# Patient Record
Sex: Female | Born: 1950 | Race: Black or African American | Hispanic: No | Marital: Married | Smoking: Never smoker
Health system: Southern US, Community
[De-identification: ages and names within clinical notes are randomized; demographics above are authoritative.]

## PROBLEM LIST (undated history)

## (undated) DIAGNOSIS — E119 Type 2 diabetes mellitus without complications: Secondary | ICD-10-CM

## (undated) DIAGNOSIS — I10 Essential (primary) hypertension: Secondary | ICD-10-CM

## (undated) DIAGNOSIS — R519 Headache, unspecified: Secondary | ICD-10-CM

## (undated) DIAGNOSIS — M1990 Unspecified osteoarthritis, unspecified site: Secondary | ICD-10-CM

## (undated) DIAGNOSIS — K219 Gastro-esophageal reflux disease without esophagitis: Secondary | ICD-10-CM

## (undated) HISTORY — DX: Essential (primary) hypertension: I10

## (undated) HISTORY — PX: KNEE SURGERY: SHX244

## (undated) HISTORY — DX: Type 2 diabetes mellitus without complications: E11.9

## (undated) HISTORY — PX: COLONOSCOPY: SHX174

## (undated) HISTORY — PX: CHOLECYSTECTOMY: SHX55

## (undated) HISTORY — DX: Headache, unspecified: R51.9

## (undated) HISTORY — PX: GALLBLADDER SURGERY: SHX652

## (undated) HISTORY — PX: CARPAL TUNNEL RELEASE: SHX101

---

## 2004-08-27 ENCOUNTER — Ambulatory Visit: Payer: Self-pay | Admitting: Family Medicine

## 2008-06-18 ENCOUNTER — Ambulatory Visit: Payer: Self-pay | Admitting: Family Medicine

## 2008-07-21 ENCOUNTER — Ambulatory Visit: Payer: Self-pay | Admitting: Gastroenterology

## 2010-12-15 ENCOUNTER — Ambulatory Visit: Payer: Self-pay | Admitting: Family Medicine

## 2011-12-30 ENCOUNTER — Ambulatory Visit: Payer: Self-pay | Admitting: General Practice

## 2012-01-06 ENCOUNTER — Inpatient Hospital Stay: Payer: Self-pay | Admitting: Specialist

## 2012-01-06 LAB — BASIC METABOLIC PANEL
Anion Gap: 31 — ABNORMAL HIGH (ref 7–16)
BUN: 39 mg/dL — ABNORMAL HIGH (ref 7–18)
Calcium, Total: 11.4 mg/dL — ABNORMAL HIGH (ref 8.5–10.1)
Chloride: 115 mmol/L — ABNORMAL HIGH (ref 98–107)
Co2: 7 mmol/L — CL (ref 21–32)
Creatinine: 1.97 mg/dL — ABNORMAL HIGH (ref 0.60–1.30)
EGFR (African American): 31 — ABNORMAL LOW
EGFR (Non-African Amer.): 27 — ABNORMAL LOW
Glucose: 801 mg/dL (ref 65–99)
Osmolality: 352 (ref 275–301)
Potassium: 3.9 mmol/L (ref 3.5–5.1)
Sodium: 153 mmol/L — ABNORMAL HIGH (ref 136–145)

## 2012-01-06 LAB — COMPREHENSIVE METABOLIC PANEL
Albumin: 3.7 g/dL (ref 3.4–5.0)
Alkaline Phosphatase: 148 U/L — ABNORMAL HIGH (ref 50–136)
Anion Gap: 29 — ABNORMAL HIGH (ref 7–16)
BUN: 39 mg/dL — ABNORMAL HIGH (ref 7–18)
Bilirubin,Total: 0.7 mg/dL (ref 0.2–1.0)
Calcium, Total: 11.3 mg/dL — ABNORMAL HIGH (ref 8.5–10.1)
Chloride: 103 mmol/L (ref 98–107)
Co2: 8 mmol/L — CL (ref 21–32)
Creatinine: 2 mg/dL — ABNORMAL HIGH (ref 0.60–1.30)
EGFR (African American): 31 — ABNORMAL LOW
EGFR (Non-African Amer.): 26 — ABNORMAL LOW
Glucose: 1183 mg/dL (ref 65–99)
Osmolality: 349 (ref 275–301)
Potassium: 4.7 mmol/L (ref 3.5–5.1)
SGOT(AST): 15 U/L (ref 15–37)
SGPT (ALT): 25 U/L
Sodium: 140 mmol/L (ref 136–145)
Total Protein: 8.3 g/dL — ABNORMAL HIGH (ref 6.4–8.2)

## 2012-01-06 LAB — TROPONIN I: Troponin-I: <0.02 ng/mL

## 2012-01-06 LAB — CBC
MCH: 27.1 pg (ref 26.0–34.0)
MCHC: 28.2 g/dL — ABNORMAL LOW (ref 32.0–36.0)
MCV: 96 fL (ref 80–100)
RBC: 5.92 10*6/uL — ABNORMAL HIGH (ref 3.80–5.20)
RDW: 16.3 % — ABNORMAL HIGH (ref 11.5–14.5)
WBC: 13.4 10*3/uL — ABNORMAL HIGH (ref 3.6–11.0)

## 2012-01-06 LAB — URINALYSIS, COMPLETE
Leukocyte Esterase: NEGATIVE
RBC,UR: 1 /HPF (ref 0–5)
Specific Gravity: 1.028 (ref 1.003–1.030)
Squamous Epithelial: NONE SEEN

## 2012-01-06 LAB — GLUCOSE, RANDOM: Glucose: 689 mg/dL (ref 65–99)

## 2012-01-06 LAB — DRUG SCREEN, URINE
Amphetamines, Ur Screen: NEGATIVE (ref ?–1000)
Barbiturates, Ur Screen: NEGATIVE (ref ?–200)
Benzodiazepine, Ur Scrn: NEGATIVE (ref ?–200)
Cocaine Metabolite,Ur ~~LOC~~: NEGATIVE (ref ?–300)
MDMA (Ecstasy)Ur Screen: NEGATIVE (ref ?–500)
Methadone, Ur Screen: NEGATIVE (ref ?–300)
Opiate, Ur Screen: NEGATIVE (ref ?–300)
Phencyclidine (PCP) Ur S: NEGATIVE (ref ?–25)
Tricyclic, Ur Screen: NEGATIVE (ref ?–1000)

## 2012-01-06 LAB — HEMOGLOBIN A1C: Hemoglobin A1C: 11.5 % — ABNORMAL HIGH (ref 4.2–6.3)

## 2012-01-06 LAB — LIPASE, BLOOD: Lipase: >3000 U/L (ref 73–393)

## 2012-01-07 ENCOUNTER — Ambulatory Visit: Payer: Self-pay | Admitting: Orthopaedic Surgery

## 2012-01-07 LAB — URINALYSIS, COMPLETE
Bacteria: NONE SEEN
Bilirubin,UR: NEGATIVE
Hyaline Cast: 17
Ph: 6 (ref 4.5–8.0)
Protein: 100
RBC,UR: 779 /HPF (ref 0–5)
Specific Gravity: 1.018 (ref 1.003–1.030)
Squamous Epithelial: 4
WBC UR: 27 /HPF (ref 0–5)

## 2012-01-07 LAB — HEMOGLOBIN A1C: Hemoglobin A1C: 11.7 % — ABNORMAL HIGH (ref 4.2–6.3)

## 2012-01-07 LAB — AMMONIA: Ammonia, Plasma: <25 mcmol/L (ref 11–32)

## 2012-01-07 LAB — CBC WITH DIFFERENTIAL/PLATELET
Basophil #: 0.2 10*3/uL — ABNORMAL HIGH (ref 0.0–0.1)
Basophil %: 1.3 %
Eosinophil #: 0 10*3/uL (ref 0.0–0.7)
Eosinophil %: 0 %
HCT: 53.9 % — ABNORMAL HIGH (ref 35.0–47.0)
Lymphocyte #: 0.7 10*3/uL — ABNORMAL LOW (ref 1.0–3.6)
MCV: 85 fL (ref 80–100)
Monocyte %: 6.3 %
Neutrophil #: 15.1 10*3/uL — ABNORMAL HIGH (ref 1.4–6.5)
Neutrophil %: 88.1 %
RBC: 6.34 10*6/uL — ABNORMAL HIGH (ref 3.80–5.20)
RDW: 14.7 % — ABNORMAL HIGH (ref 11.5–14.5)

## 2012-01-07 LAB — LIPID PANEL
HDL Cholesterol: 70 mg/dL — ABNORMAL HIGH (ref 40–60)
Ldl Cholesterol, Calc: 75 mg/dL (ref 0–100)
Triglycerides: 178 mg/dL (ref 0–200)

## 2012-01-07 LAB — BASIC METABOLIC PANEL
Anion Gap: 12 (ref 7–16)
Anion Gap: 21 — ABNORMAL HIGH (ref 7–16)
BUN: 44 mg/dL — ABNORMAL HIGH (ref 7–18)
BUN: 42 mg/dL — ABNORMAL HIGH (ref 7–18)
Calcium, Total: 11.9 mg/dL — ABNORMAL HIGH (ref 8.5–10.1)
Calcium, Total: 11.1 mg/dL — ABNORMAL HIGH (ref 8.5–10.1)
Calcium, Total: 11.4 mg/dL — ABNORMAL HIGH (ref 8.5–10.1)
Chloride: 123 mmol/L — ABNORMAL HIGH (ref 98–107)
Chloride: 124 mmol/L — ABNORMAL HIGH (ref 98–107)
Chloride: 124 mmol/L — ABNORMAL HIGH (ref 98–107)
Co2: 19 mmol/L — ABNORMAL LOW (ref 21–32)
Co2: 20 mmol/L — ABNORMAL LOW (ref 21–32)
Co2: 21 mmol/L (ref 21–32)
Creatinine: 1.57 mg/dL — ABNORMAL HIGH (ref 0.60–1.30)
Creatinine: 1.73 mg/dL — ABNORMAL HIGH (ref 0.60–1.30)
Creatinine: 1.77 mg/dL — ABNORMAL HIGH (ref 0.60–1.30)
Creatinine: 1.83 mg/dL — ABNORMAL HIGH (ref 0.60–1.30)
EGFR (African American): 36 — ABNORMAL LOW
EGFR (Non-African Amer.): 31 — ABNORMAL LOW
EGFR (Non-African Amer.): 35 — ABNORMAL LOW
Glucose: 134 mg/dL — ABNORMAL HIGH (ref 65–99)
Glucose: 473 mg/dL — ABNORMAL HIGH (ref 65–99)
Osmolality: 328 (ref 275–301)
Osmolality: 346 (ref 275–301)
Potassium: 3.6 mmol/L (ref 3.5–5.1)
Potassium: 3.8 mmol/L (ref 3.5–5.1)
Sodium: 156 mmol/L — ABNORMAL HIGH (ref 136–145)
Sodium: 158 mmol/L — ABNORMAL HIGH (ref 136–145)
Sodium: 159 mmol/L — ABNORMAL HIGH (ref 136–145)

## 2012-01-08 LAB — CBC WITH DIFFERENTIAL/PLATELET
Basophil #: 0 10*3/uL (ref 0.0–0.1)
Basophil %: 0.1 %
Eosinophil %: 0 %
HCT: 47.2 % — ABNORMAL HIGH (ref 35.0–47.0)
Lymphocyte %: 10.1 %
MCH: 27.5 pg (ref 26.0–34.0)
Monocyte #: 0.9 x10 3/mm (ref 0.2–0.9)
Monocyte %: 7.1 %
Neutrophil #: 9.9 10*3/uL — ABNORMAL HIGH (ref 1.4–6.5)
Neutrophil %: 82.7 %
RDW: 15 % — ABNORMAL HIGH (ref 11.5–14.5)
WBC: 12 10*3/uL — ABNORMAL HIGH (ref 3.6–11.0)

## 2012-01-08 LAB — BASIC METABOLIC PANEL
BUN: 37 mg/dL — ABNORMAL HIGH (ref 7–18)
Co2: 21 mmol/L (ref 21–32)
Creatinine: 1.22 mg/dL (ref 0.60–1.30)
EGFR (Non-African Amer.): 48 — ABNORMAL LOW
Osmolality: 322 (ref 275–301)

## 2012-01-08 LAB — COMPREHENSIVE METABOLIC PANEL
Albumin: 2.9 g/dL — ABNORMAL LOW (ref 3.4–5.0)
Anion Gap: 11 (ref 7–16)
BUN: 43 mg/dL — ABNORMAL HIGH (ref 7–18)
Bilirubin,Total: 0.5 mg/dL (ref 0.2–1.0)
Calcium, Total: 11.8 mg/dL — ABNORMAL HIGH (ref 8.5–10.1)
Creatinine: 1.42 mg/dL — ABNORMAL HIGH (ref 0.60–1.30)
EGFR (African American): 46 — ABNORMAL LOW
EGFR (Non-African Amer.): 40 — ABNORMAL LOW
Osmolality: 324 (ref 275–301)
Potassium: 4.1 mmol/L (ref 3.5–5.1)
SGPT (ALT): 19 U/L
Sodium: 157 mmol/L — ABNORMAL HIGH (ref 136–145)
Total Protein: 6.8 g/dL (ref 6.4–8.2)

## 2012-01-09 LAB — BASIC METABOLIC PANEL
Anion Gap: 9 (ref 7–16)
BUN: 26 mg/dL — ABNORMAL HIGH (ref 7–18)
Calcium, Total: 10.1 mg/dL (ref 8.5–10.1)
Co2: 23 mmol/L (ref 21–32)
Creatinine: 0.91 mg/dL (ref 0.60–1.30)
Glucose: 132 mg/dL — ABNORMAL HIGH (ref 65–99)
Osmolality: 308 (ref 275–301)
Potassium: 3.5 mmol/L (ref 3.5–5.1)
Sodium: 152 mmol/L — ABNORMAL HIGH (ref 136–145)

## 2012-01-09 LAB — LIPASE, BLOOD: Lipase: 160 U/L (ref 73–393)

## 2012-01-09 LAB — CBC WITH DIFFERENTIAL/PLATELET
Bands: 5 %
HGB: 13.3 g/dL (ref 12.0–16.0)
Lymphocytes: 31 %
MCHC: 32.8 g/dL (ref 32.0–36.0)
MCV: 84 fL (ref 80–100)
Metamyelocyte: 3 %
Monocytes: 3 %
Myelocyte: 2 %
NRBC/100 WBC: 6 /
RBC: 4.78 10*6/uL (ref 3.80–5.20)
Variant Lymphocyte - H1-Rlymph: 5 %

## 2012-01-09 LAB — URINE CULTURE

## 2012-01-10 LAB — BASIC METABOLIC PANEL
Anion Gap: 10 (ref 7–16)
BUN: 18 mg/dL (ref 7–18)
Chloride: 116 mmol/L — ABNORMAL HIGH (ref 98–107)
Co2: 22 mmol/L (ref 21–32)
Creatinine: 0.72 mg/dL (ref 0.60–1.30)
EGFR (Non-African Amer.): >60
Potassium: 4.2 mmol/L (ref 3.5–5.1)
Sodium: 148 mmol/L — ABNORMAL HIGH (ref 136–145)

## 2012-01-11 LAB — BASIC METABOLIC PANEL
BUN: 17 mg/dL (ref 7–18)
Calcium, Total: 10.4 mg/dL — ABNORMAL HIGH (ref 8.5–10.1)
Co2: 25 mmol/L (ref 21–32)
Glucose: 69 mg/dL (ref 65–99)
Osmolality: 298 (ref 275–301)
Potassium: 4.1 mmol/L (ref 3.5–5.1)
Sodium: 150 mmol/L — ABNORMAL HIGH (ref 136–145)

## 2012-02-01 ENCOUNTER — Ambulatory Visit: Payer: Self-pay | Admitting: Unknown Physician Specialty

## 2012-02-09 ENCOUNTER — Ambulatory Visit: Payer: Self-pay | Admitting: Unknown Physician Specialty

## 2012-02-15 ENCOUNTER — Ambulatory Visit: Payer: Self-pay | Admitting: Unknown Physician Specialty

## 2012-09-25 ENCOUNTER — Ambulatory Visit: Payer: Self-pay | Admitting: Internal Medicine

## 2013-02-25 ENCOUNTER — Emergency Department: Payer: Self-pay | Admitting: Emergency Medicine

## 2013-02-25 LAB — BASIC METABOLIC PANEL
BUN: 20 mg/dL — ABNORMAL HIGH (ref 7–18)
Chloride: 110 mmol/L — ABNORMAL HIGH (ref 98–107)
Creatinine: 1.22 mg/dL (ref 0.60–1.30)
EGFR (African American): 55 — ABNORMAL LOW
Glucose: 129 mg/dL — ABNORMAL HIGH (ref 65–99)
Osmolality: 286 (ref 275–301)
Potassium: 4.3 mmol/L (ref 3.5–5.1)

## 2013-02-25 LAB — CBC
HGB: 14.9 g/dL (ref 12.0–16.0)
MCH: 27.3 pg (ref 26.0–34.0)
MCHC: 33.3 g/dL (ref 32.0–36.0)
MCV: 82 fL (ref 80–100)
Platelet: 207 10*3/uL (ref 150–440)
RBC: 5.45 10*6/uL — ABNORMAL HIGH (ref 3.80–5.20)
RDW: 14.7 % — ABNORMAL HIGH (ref 11.5–14.5)
WBC: 9.6 10*3/uL (ref 3.6–11.0)

## 2013-02-25 LAB — URINALYSIS, COMPLETE
Bilirubin,UR: NEGATIVE
Glucose,UR: NEGATIVE mg/dL (ref 0–75)
Ketone: NEGATIVE
Nitrite: NEGATIVE
Protein: 30
RBC,UR: 226 /HPF (ref 0–5)
Specific Gravity: 1.029 (ref 1.003–1.030)

## 2013-05-29 IMAGING — CT CT HEAD WITHOUT CONTRAST
1 series · 16 of 30 positions shown, 20 images · non-contrast
Comparison: none

REASON FOR EXAM: encephalopathy
COMMENTS:

PROCEDURE:     CT  - CT HEAD WITHOUT CONTRAST  - January 07, 2012 [DATE]
RESULT:     Technique: Helical 5mm sections were obtained from the skull
base to the vertex without administration of intravenous contrast.

[Series 2: soft tissue · axial · 0.42mm/px · z∈[+282,+432]mm · 16 of 34 slices shown, 20 images]
[im 2/34  brain]
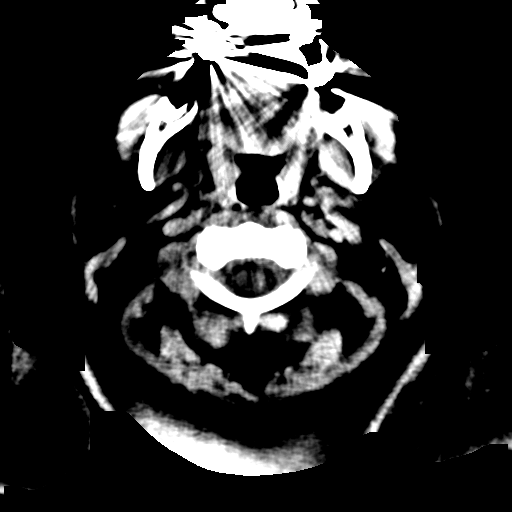
[im 2/34  bone]
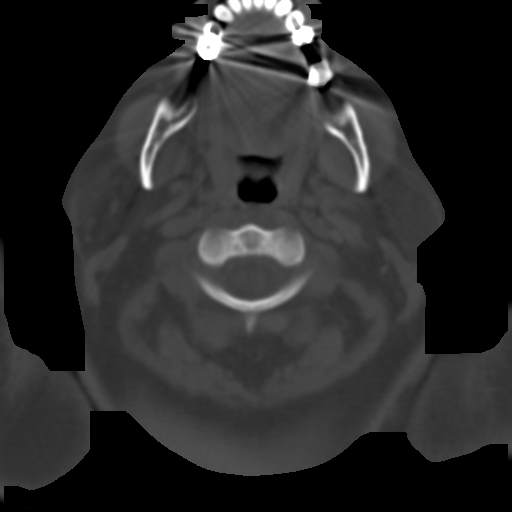
[im 4/34  brain]
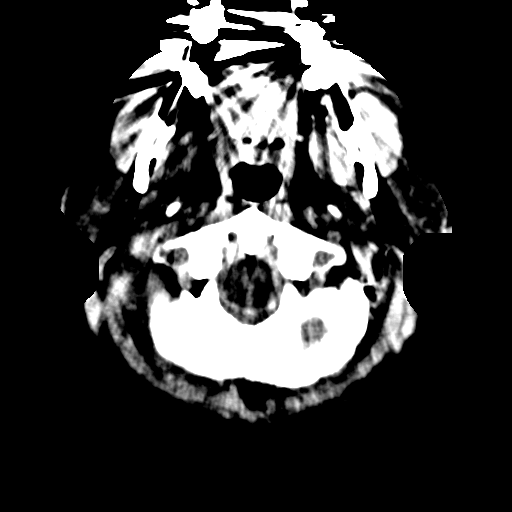
[im 6/34  brain]
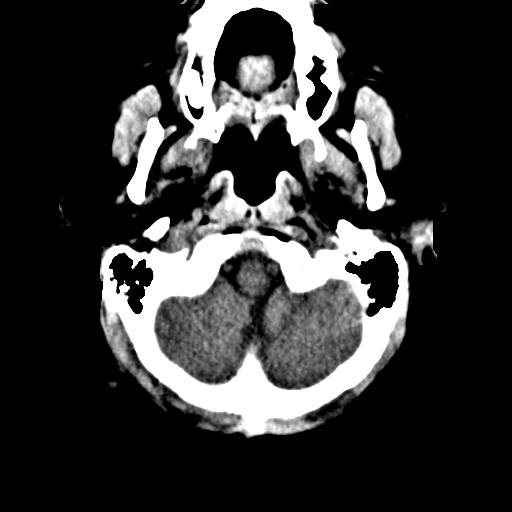
[im 8/34  brain]
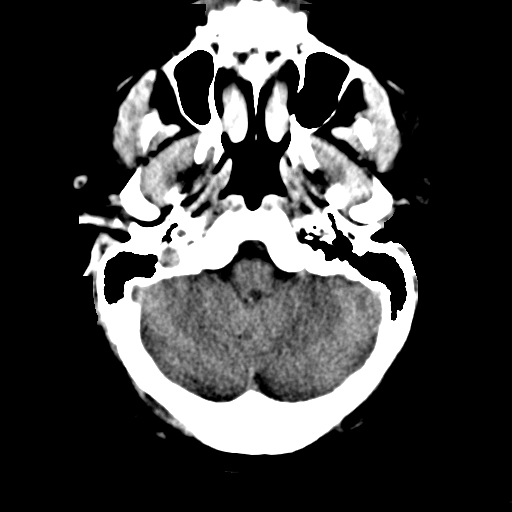
[im 10/34  brain]
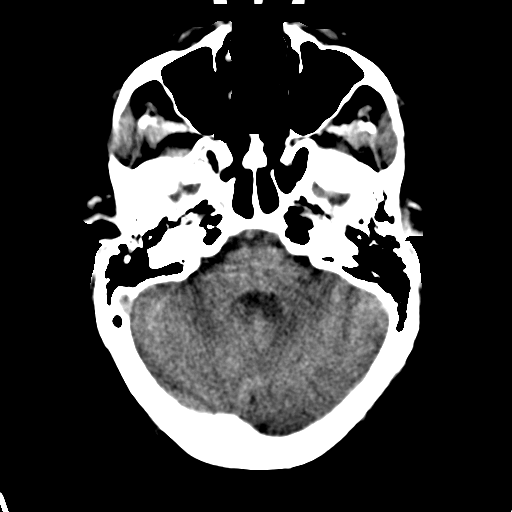
[im 10/34  bone]
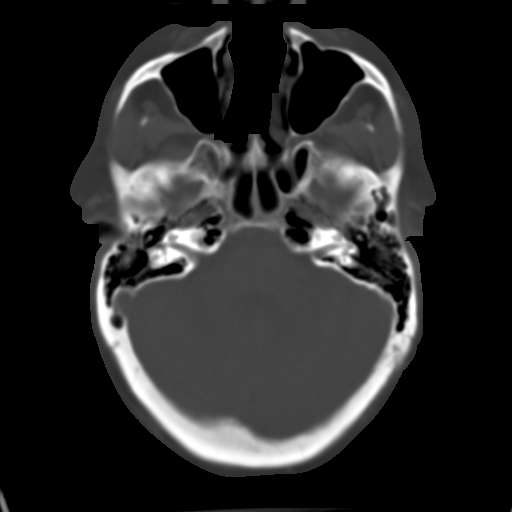
[im 12/34  brain]
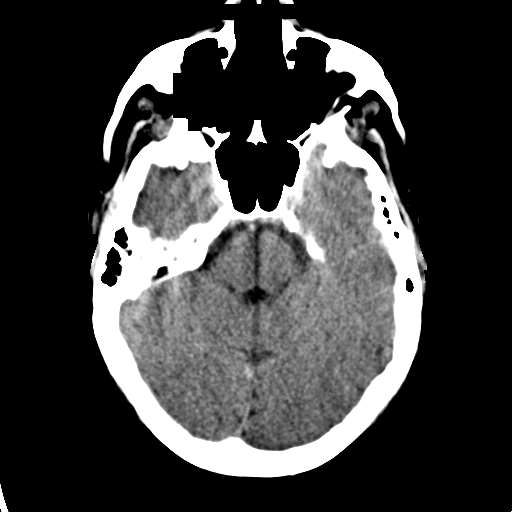
[im 14/34  brain]
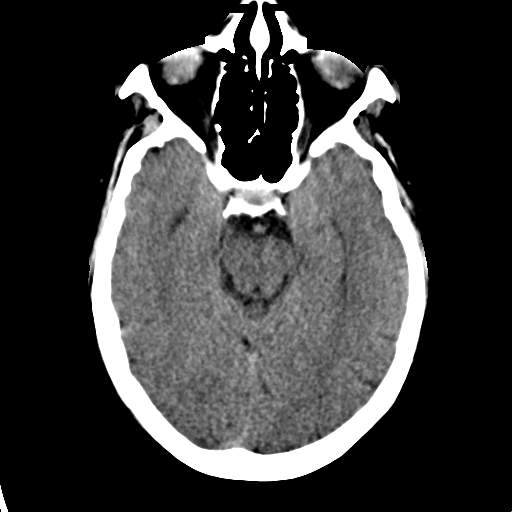
[im 16/34  brain]
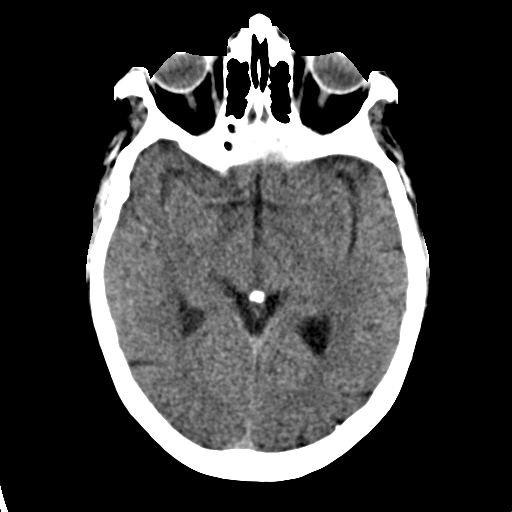
[im 18/34  brain]
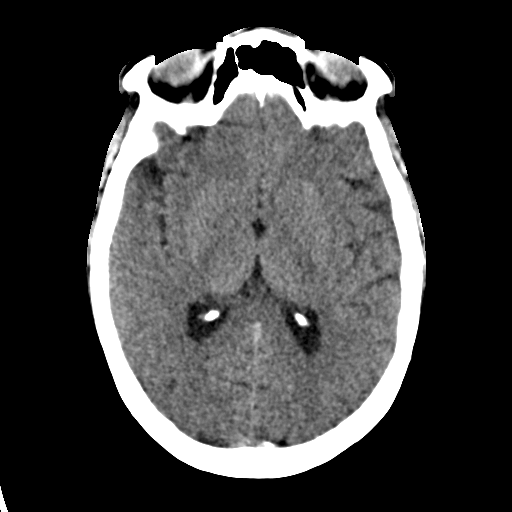
[im 18/34  bone]
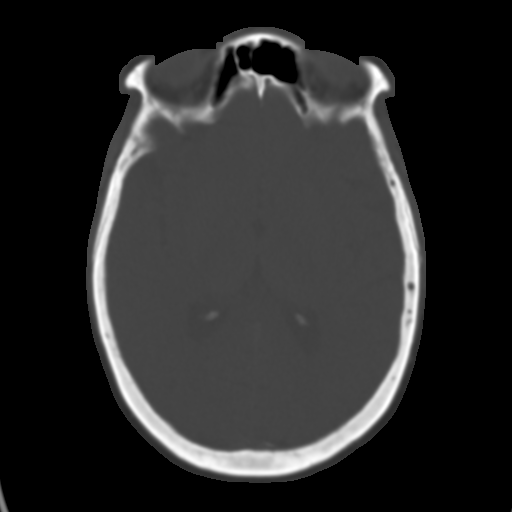
[im 20/34  brain]
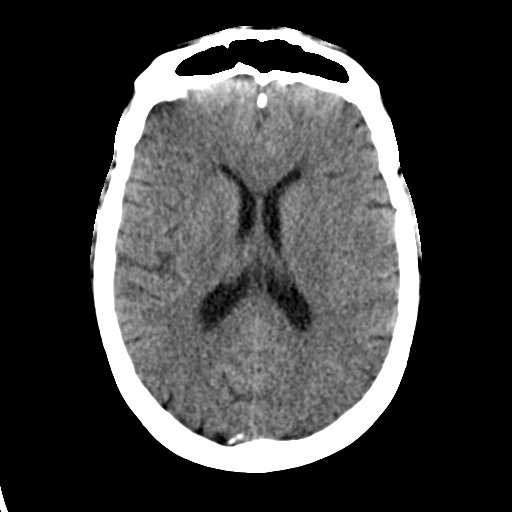
[im 22/34  brain]
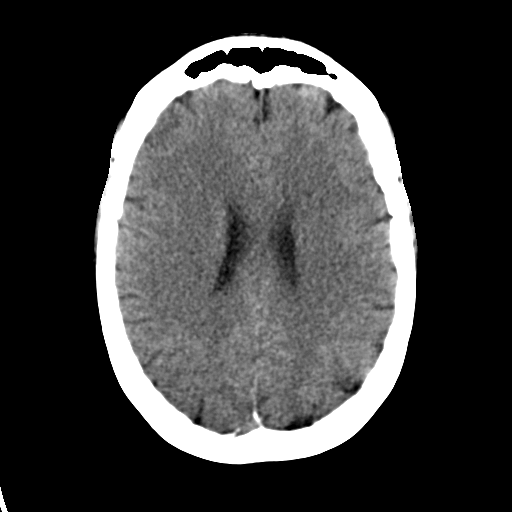
[im 24/34  brain]
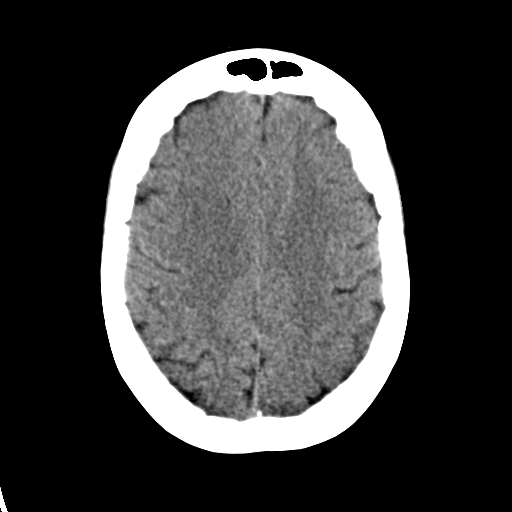
[im 26/34  brain]
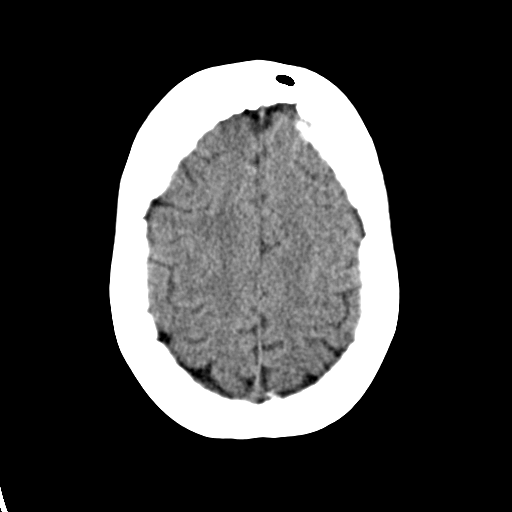
[im 26/34  bone]
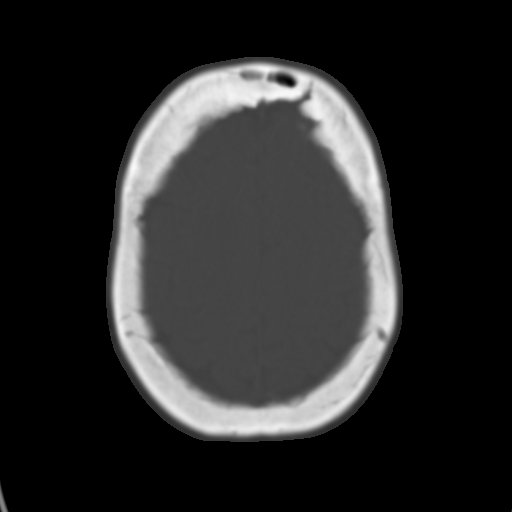
[im 28/34  brain]
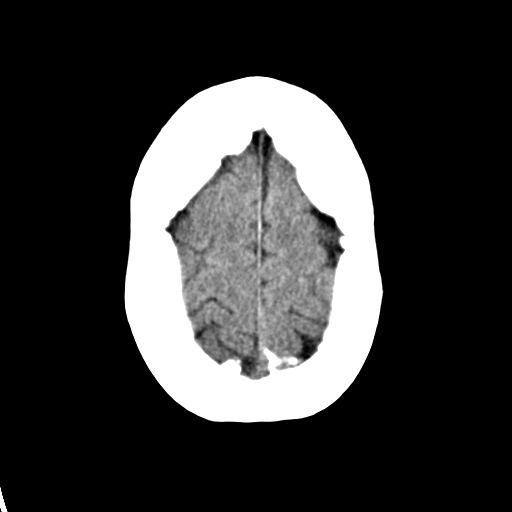
[im 30/34  brain]
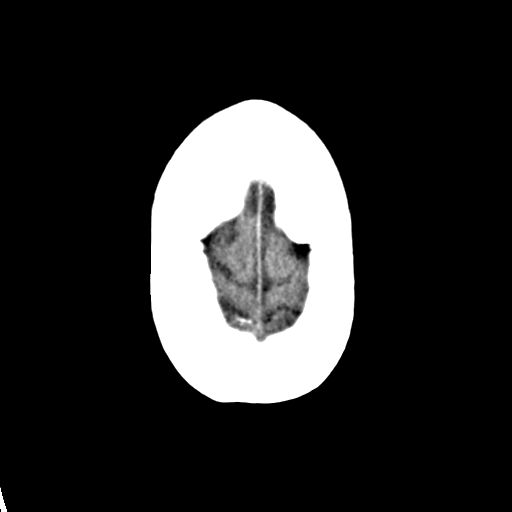
[im 32/34  brain]
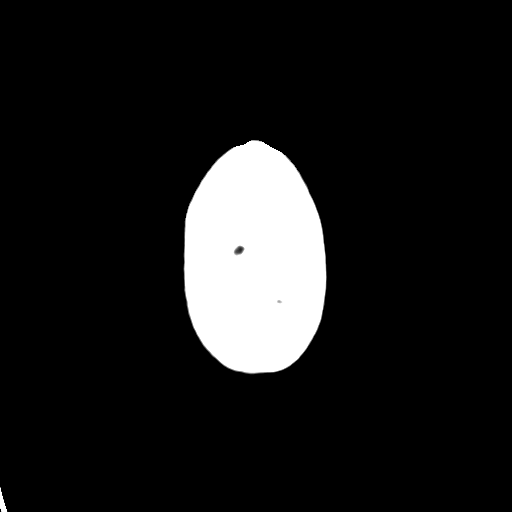

[16 of 30 positions shown; findings below may reference images not displayed]

FINDINGS: There is not evidence of intra-axial fluid collections. There is
no evidence of acute hemorrhage or secondary signs reflecting mass effect or
subacute or chronic focal territorial infarction. The osseous structures
demonstrate no evidence of a depressed skull fracture. If there is
persistent concern clinical follow-up with MRI is recommended.
IMPRESSION: 1. No evidence of acute intracranial abnormalities.

## 2013-05-29 IMAGING — CR DG KNEE 1-2V*R*
1 series · 2 of 2 positions shown · non-contrast
Comparison: none

REASON FOR EXAM: pain
COMMENTS:   Bedside (portable):Y

PROCEDURE:     DXR - DXR KNEE RIGHT AP AND LATERAL  - January 07, 2012 [DATE]
RESULT:

[Series 1: lat · 0.17mm/px · 2 of 2 slices shown]
[im 1/2]
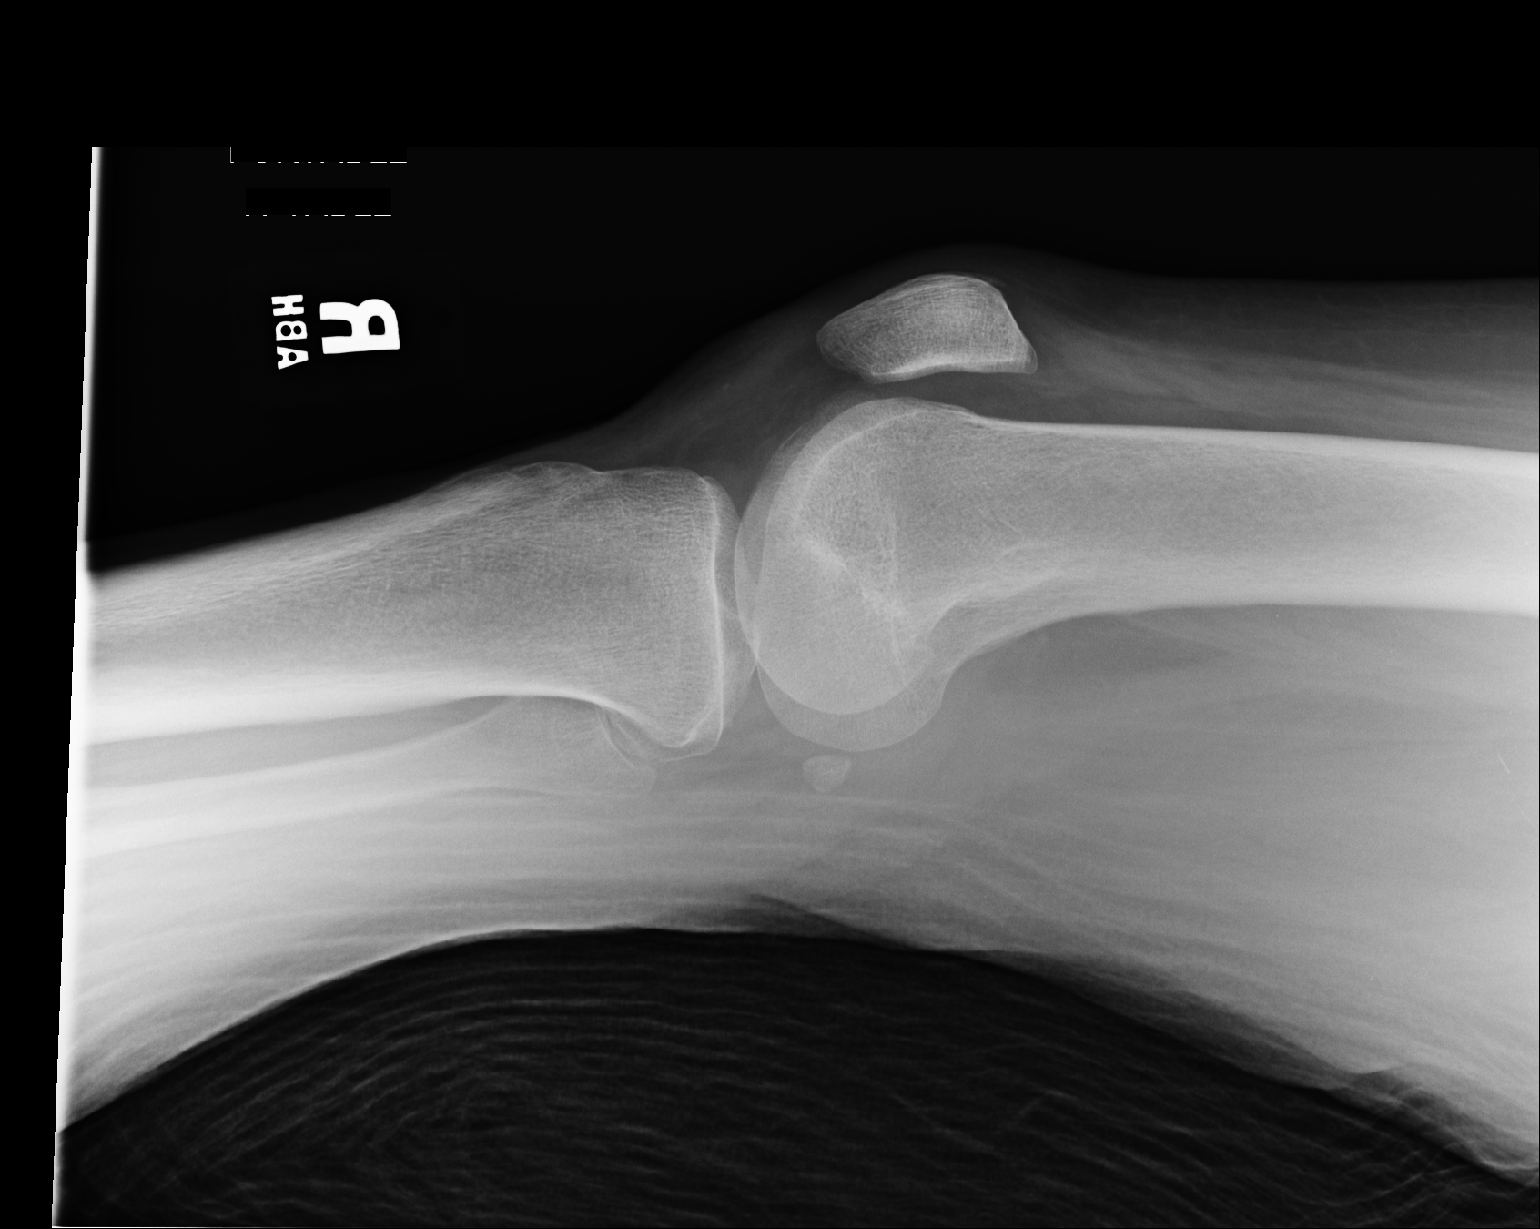
[im 2/2]
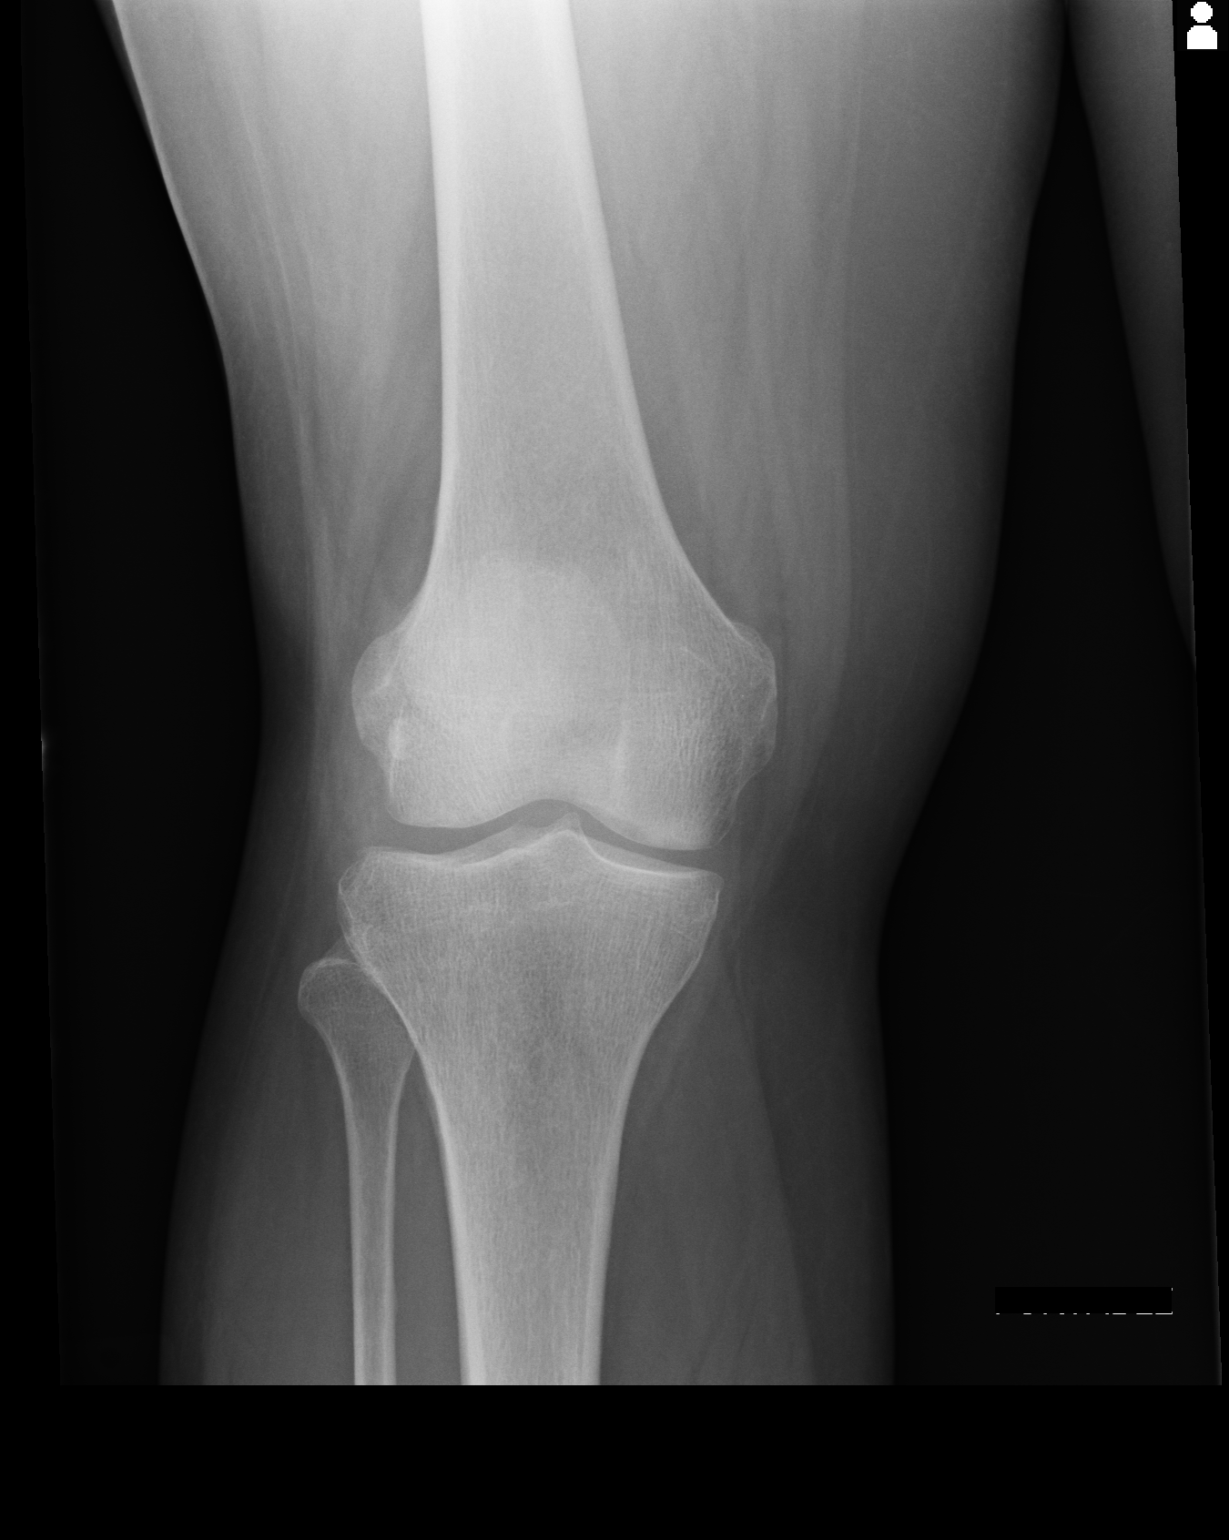

[2 of 2 positions shown; findings below may reference images not displayed]

FINDINGS: There is no radiographic evidence of acute fracture, dislocation
nor malalignment. When correlated with a recent MRI there are findings
concerning for a possible subchondral insufficiency fracture involving the
medial femoral condyle. This is consistent with a radio-occult finding.
Incidental note is made of a fabella.
IMPRESSION: No evidence of radio-overt osseous abnormalities.

## 2014-03-19 ENCOUNTER — Ambulatory Visit: Payer: Self-pay | Admitting: Internal Medicine

## 2014-11-11 ENCOUNTER — Inpatient Hospital Stay
Admit: 2014-11-11 | Disposition: A | Discharge status: Home / Self Care | Payer: Self-pay | Attending: Internal Medicine | Admitting: Internal Medicine

## 2014-11-11 LAB — CBC WITH DIFFERENTIAL/PLATELET
BASOS ABS: 0.2 10*3/uL — AB (ref 0.0–0.1)
BASOS PCT: 1.3 %
EOS ABS: 0 10*3/uL (ref 0.0–0.7)
Eosinophil %: 0.1 %
HCT: 57.2 % — ABNORMAL HIGH (ref 35.0–47.0)
HGB: 17.7 g/dL — ABNORMAL HIGH (ref 12.0–16.0)
Lymphocyte #: 0.6 10*3/uL — ABNORMAL LOW (ref 1.0–3.6)
Lymphocyte %: 3.2 %
MCH: 26.5 pg (ref 26.0–34.0)
MCHC: 31 g/dL — AB (ref 32.0–36.0)
MCV: 85 fL (ref 80–100)
MONO ABS: 0.8 x10 3/mm (ref 0.2–0.9)
Monocyte %: 4.3 %
NEUTROS ABS: 17.1 10*3/uL — AB (ref 1.4–6.5)
Neutrophil %: 91.1 %
PLATELETS: 293 10*3/uL (ref 150–440)
RBC: 6.71 10*6/uL — ABNORMAL HIGH (ref 3.80–5.20)
RDW: 14.6 % — AB (ref 11.5–14.5)
WBC: 18.8 10*3/uL — ABNORMAL HIGH (ref 3.6–11.0)

## 2014-11-11 LAB — URINALYSIS, COMPLETE
BILIRUBIN, UR: NEGATIVE
BLOOD: NEGATIVE
Hyaline Cast: 43
LEUKOCYTE ESTERASE: NEGATIVE
Nitrite: NEGATIVE
PH: 5 (ref 4.5–8.0)
Protein: 30
SPECIFIC GRAVITY: 1.023 (ref 1.003–1.030)
Squamous Epithelial: 4
WBC UR: 2 /HPF (ref 0–5)

## 2014-11-11 LAB — BASIC METABOLIC PANEL
ANION GAP: 15 (ref 7–16)
BUN: 46 mg/dL — ABNORMAL HIGH
CO2: 19 mmol/L — AB
Calcium, Total: 10.6 mg/dL — ABNORMAL HIGH
Chloride: 112 mmol/L — ABNORMAL HIGH
Creatinine: 1.46 mg/dL — ABNORMAL HIGH
EGFR (Non-African Amer.): 38 — ABNORMAL LOW
GFR CALC AF AMER: 44 — AB
Glucose: 349 mg/dL — ABNORMAL HIGH
Potassium: 4 mmol/L
Sodium: 146 mmol/L — ABNORMAL HIGH

## 2014-11-11 LAB — COMPREHENSIVE METABOLIC PANEL
ALBUMIN: 4.9 g/dL
ALT: 24 U/L
AST: 24 U/L
Alkaline Phosphatase: 133 U/L — ABNORMAL HIGH
Anion Gap: 22 — ABNORMAL HIGH (ref 7–16)
BILIRUBIN TOTAL: 1.9 mg/dL — AB
BUN: 54 mg/dL — AB
CREATININE: 1.85 mg/dL — AB
Calcium, Total: 11.3 mg/dL — ABNORMAL HIGH
Chloride: 101 mmol/L
Co2: 17 mmol/L — ABNORMAL LOW
EGFR (African American): 33 — ABNORMAL LOW
EGFR (Non-African Amer.): 28 — ABNORMAL LOW
Glucose: 598 mg/dL
POTASSIUM: 5.1 mmol/L
Sodium: 140 mmol/L
Total Protein: 9.1 g/dL — ABNORMAL HIGH

## 2014-11-11 LAB — BETA-HYDROXYBUTYRIC ACID: Beta-Hydroxybutyrate: 6.81 mmol/L — ABNORMAL HIGH

## 2014-11-11 LAB — TROPONIN I: Troponin-I: 0.04 ng/mL — ABNORMAL HIGH

## 2014-11-11 LAB — HEMOGLOBIN A1C: Hemoglobin A1C: 10.6 % — ABNORMAL HIGH

## 2014-11-12 LAB — CBC WITH DIFFERENTIAL/PLATELET
Basophil #: 0.1 10*3/uL (ref 0.0–0.1)
Basophil %: 0.4 %
Eosinophil #: 0 10*3/uL (ref 0.0–0.7)
Eosinophil %: 0.1 %
HCT: 53.2 % — AB (ref 35.0–47.0)
HGB: 16.9 g/dL — ABNORMAL HIGH (ref 12.0–16.0)
Lymphocyte #: 0.9 10*3/uL — ABNORMAL LOW (ref 1.0–3.6)
Lymphocyte %: 7.7 %
MCH: 26.9 pg (ref 26.0–34.0)
MCHC: 31.7 g/dL — ABNORMAL LOW (ref 32.0–36.0)
MCV: 85 fL (ref 80–100)
MONO ABS: 0.9 x10 3/mm (ref 0.2–0.9)
Monocyte %: 7.6 %
NEUTROS ABS: 9.6 10*3/uL — AB (ref 1.4–6.5)
Neutrophil %: 84.2 %
Platelet: 161 10*3/uL (ref 150–440)
RBC: 6.28 10*6/uL — AB (ref 3.80–5.20)
RDW: 14.7 % — ABNORMAL HIGH (ref 11.5–14.5)
WBC: 11.4 10*3/uL — ABNORMAL HIGH (ref 3.6–11.0)

## 2014-11-12 LAB — BASIC METABOLIC PANEL
Anion Gap: 9 (ref 7–16)
BUN: 33 mg/dL — ABNORMAL HIGH
CALCIUM: 10.8 mg/dL — AB
Chloride: 120 mmol/L — ABNORMAL HIGH
Co2: 24 mmol/L
Creatinine: 0.98 mg/dL
EGFR (Non-African Amer.): >60
Glucose: 149 mg/dL — ABNORMAL HIGH
Potassium: 3.5 mmol/L
Sodium: 153 mmol/L — ABNORMAL HIGH

## 2014-11-13 LAB — CBC WITH DIFFERENTIAL/PLATELET
BASOS PCT: 0.5 %
Basophil #: 0 10*3/uL (ref 0.0–0.1)
EOS PCT: 0.4 %
Eosinophil #: 0 10*3/uL (ref 0.0–0.7)
HCT: 48 % — AB (ref 35.0–47.0)
HGB: 15.1 g/dL (ref 12.0–16.0)
Lymphocyte #: 1.5 10*3/uL (ref 1.0–3.6)
Lymphocyte %: 14.9 %
MCH: 26.9 pg (ref 26.0–34.0)
MCHC: 31.5 g/dL — AB (ref 32.0–36.0)
MCV: 85 fL (ref 80–100)
MONO ABS: 0.6 x10 3/mm (ref 0.2–0.9)
Monocyte %: 6.2 %
NEUTROS ABS: 7.7 10*3/uL — AB (ref 1.4–6.5)
Neutrophil %: 78 %
Platelet: 184 10*3/uL (ref 150–440)
RBC: 5.63 10*6/uL — ABNORMAL HIGH (ref 3.80–5.20)
RDW: 15 % — ABNORMAL HIGH (ref 11.5–14.5)
WBC: 9.9 10*3/uL (ref 3.6–11.0)

## 2014-11-13 LAB — BASIC METABOLIC PANEL
Anion Gap: 7 (ref 7–16)
BUN: 28 mg/dL — ABNORMAL HIGH
CALCIUM: 10.1 mg/dL
CO2: 21 mmol/L — AB
Chloride: 120 mmol/L — ABNORMAL HIGH
Creatinine: 0.76 mg/dL
EGFR (African American): >60
Glucose: 292 mg/dL — ABNORMAL HIGH
Potassium: 4.1 mmol/L
Sodium: 148 mmol/L — ABNORMAL HIGH

## 2014-12-07 NOTE — Op Note (Signed)
PATIENT NAME:  Charlene Parker, Charlene Parker MR#:  [id number] DATE OF BIRTH:  11/16/1950  DATE OF PROCEDURE:  02/15/2012  PREOPERATIVE DIAGNOSIS: Torn medial meniscus, right knee plus medial femoral condyle stress fracture.   POSTOPERATIVE DIAGNOSIS: Torn medial meniscus, right knee plus medial femoral condyle stress fracture.   OPERATION: Arthroscopic partial medial meniscectomy plus medial femoral condylar sub-chondroplasty.  SURGEON: Alda Berthold., M.D.   ANESTHESIA: General.   HISTORY: The patient had a long history of right knee pain. X-rays and MRI were consistent with a stress fracture of the medial femoral condyle and torn medial meniscus. Because the patient did not respond to several months of protection referable to her right knee she was brought in for surgery.   PROCEDURE: The patient was taken to the operating room where satisfactory general anesthesia was achieved. A tourniquet was applied to the patient's right thigh and then a legholder was applied over this. The left lower extremity was supported with a well leg support system. The right knee was prepped and draped in the usual fashion for an arthroscopic procedure. The patient was given 2 grams of Kefzol IV prior to the start of the procedure.   An inflow cannula was introduced superomedially. The joint was distended with lactated Ringer's. We used a 7M fluid pump to facilitate joint distention.   The scope was introduced through an inferolateral puncture wound and a probe through an inferomedial puncture wound. Inspection of the medial compartment revealed that the articular surfaces were smooth. There was a tear of the posterior root of the medial meniscus. I went ahead and resected the tear using a combination of basket biters and motorized resector. The rim was contoured with an angled ArthroCare thermal wand.   I did probe the femoral articular surface.  It did not indent easily. It seemed to have normal consistency.    Inspection of the intercondylar notch revealed the anterior cruciate was intact. Inspection of the lateral compartment along with probing of the lateral meniscus failed to reveal any abnormality. There was no significant trochlear groove lesion. I did inspect the retropatellar surface with the scope in the superomedial portal. Indeed there was only minimal fraying of the retropatellar surface. The patella seemed to track normally as viewed from the superomedial portal.   The scope was removed and the cannulas were removed at this time. I brought the FluoroScan unit in and used it to position the delivery trocar and cannula for the sub-chondroplasty. Once the trocar was satisfactorily positioned in the medial femoral condyle on the AP and lateral image intensification views, the insertional trocar was removed from the cannula. Then I injected 5 mL of calcium phosphate through the cannula system into the involved medial femoral condyle. After eight minutes the cannula was removed. I then reinserted the scope into the knee joint and visualized the medial compartment. No calcium phosphate extravasation was noted in the medial compartment.   The scope was removed and the puncture wounds were closed with 3-0 nylon in vertical mattress fashion. Several milliliters of 0.5% Marcaine with epinephrine was injected about each puncture wound. Betadine was applied to the wounds followed by a sterile dressing. An ice pack was applied around the patient's right knee. She was then awakened and transferred to her stretcher bed. She was taken to the recovery room in satisfactory condition. Blood loss was negligible.     ____________________________ Alda Berthold., MD hbk:bjt D: 02/15/2012 10:22:02 ET T: 02/15/2012 11:19:41 ET JOB#: [id number]  cc: Jake Shark  B. Royann ShiversKernodle Jr., MD, <Dictator> Randon GoldsmithHAROLD B Xavyer Steenson, JR MD ELECTRONICALLY SIGNED 02/27/2012 11:46

## 2014-12-07 NOTE — Consult Note (Signed)
Brief Consult Note: Diagnosis: Right knee Meniscal tear and chondral injury.   Patient was seen by consultant.   Comments: Patient was seen and examined.  She is admitted for DKA.  MRI right knee was obtained as outpatient 5/17 showing meniscal tear, chondral injury and baker's cyst.  Xrays today show no fracture or dislocation.  No sign of infection on exam. Painless full range of motion of right knee.  No warmth or erythema.  Patient should follow up with her orthopaedic surgeon as an outpatient.  She may follow up with Dr. Rosita KeaMenz if she does not already have a Careers advisersurgeon.  Electronic Signatures: Gasper LloydMaloof, Anikka Marsan B (MD)  (Signed [phone]-May-13 12:09)  Authored: Brief Consult Note   Last Updated: 25-May-13 12:09 by Gasper LloydMaloof, Tiearra Colwell B (MD)

## 2014-12-07 NOTE — H&P (Signed)
PATIENT NAME:  Charlene Parker, KRALL MR#:  [id number] DATE OF BIRTH:  1951/05/19  DATE OF ADMISSION:  01/06/2012  PRIMARY CARE PHYSICIAN: Unknown.  CHIEF COMPLAINT: Brought in with unresponsiveness.   HISTORY OF PRESENT ILLNESS: This is 64 year old woman whom I am unable to get a history from at this time. Family was in earlier when the ER physician saw the patient and then left. I am unable to reach family at this time. According to the ER physician, the patient has been having knee pain and has been taking pain medication, received a Toradol shot and has not been well since. In the Emergency Room, she was found to have a sugar of 1183, acute renal failure with a creatinine of 2, a CO2 of 8, anion gap of 29. Hospitalist services were contacted for further evaluation.   PAST MEDICAL HISTORY: Unknown.   PAST SURGICAL HISTORY: Unknown.   ALLERGIES: Unknown.  MEDICATIONS: That were with the patient include Mobic 15 mg daily, oxycodone 5/325 one tablet p.r.n. pain, and there is hydrocodone at night for sleep, and there was another bottle of the oxycodone.   SOCIAL HISTORY: Unknown but lives with husband.   FAMILY HISTORY: Unknown.  REVIEW OF SYSTEMS: Unable to obtain.   PHYSICAL EXAMINATION: VITAL SIGNS: Blood pressure 144/74, pulse 130, temperature 96.4, respirations 32, pulse oximetry 90%.   GENERAL: Shallow breathing.   EYES: Conjunctivae and lids normal. Pupils equal, round, and reactive to light. Unable to test extraocular muscles.   EARS, NOSE, MOUTH, AND THROAT: Tympanic membranes: No erythema. Nasal mucosa: No erythema. Throat: Dry lips and gums. No lesions.   NECK: No JVD. No bruits. No lymphadenopathy. No thyromegaly. No thyroid nodules palpated.   RESPIRATORY: Shallow breaths.   LUNGS: Clear to auscultation.  CARDIOVASCULAR SYSTEM: S1, S2 tachycardic. No gallops, rubs, or murmurs heard. Carotid upstroke 2+ bilaterally. No bruits. Dorsalis pedis pulses 2+ bilaterally. Trace  edema of the lower extremity.   ABDOMEN: Soft. Positive tenderness. No organomegaly/splenomegaly. Normoactive bowel sounds. No masses felt.   LYMPHATICS: No lymph nodes in the neck.   MUSCULOSKELETAL: Trace edema. No clubbing. No cyanosis.   SKIN: Looks like some bruising or mottling on the side.   NEUROLOGIC: The patient has altered mental status. Is able to move extremities to command but is lethargic. Unable to follow many commands but is moving extremities.   PSYCHIATRIC: The patient does answer some yes or no questions. Unable to test orientation status. Patient is very lethargic.   LABORATORY AND RADIOLOGICAL DATA: Urinalysis: 2+ ketones, 500 mg/dL of glucose, otherwise negative. Glucose 1183, BUN 39, creatinine 2.0, sodium 140, potassium 4.7, chloride 103, CO2 of 8, calcium 11.3. Total bilirubin 0.7, alkaline phosphatase 148, ALT 25, AST 15, total protein 8.3, anion gap 29, GFR 26. Troponin negative. The patient did have a recent MRI of the right knee that showed marrow edema within the anterior aspect of the medial femoral condyle with subtle linear signal abnormality. T1 weighted images concerning for a subchondral insufficiency fracture.  Hospitalist services were contacted for further evaluation.   ASSESSMENT AND PLAN: 1. Diabetic ketoacidosis. Will admit to the Intensive Care Unit. The patient has severe acidosis. Will obtain an ABG for further evaluation. Will give vigorous IV fluid hydration, 2 liters wide open of normal saline and then normal saline plus potassium at 175 mL per hour. Will start on the hyperglycemia protocol with insulin IV push 10 units and starting an insulin drip. Urinalysis was ordered by me. Urine toxicology is still  pending. Serial BMPs. A lipase was ordered by me to rule out pancreatitis, and this was elevated, so I will also get an ultrasound of the abdomen in the a.m. 2. Encephalopathy, most likely from diabetic ketoacidosis. Could have been taking a lot of  pain medications. Also, will hold pain medications at this time. Treat diabetic ketoacidosis and see what mental status improves. 3. Acute renal failure. Will give IV fluid hydration and recheck a BMP serially. 4. Hypercalcemia, most likely from dehydration. Will check serial BMPs and check a PTH.  5. Abdominal pain. Sent off a lipase. Came back as a pancreatitis. We will give vigorous IV fluid hydration. Check an abdominal ultrasound in the a.m. Further management based on results of that.  6. Will get an ultrasound of the lower extremity to rule out deep vein thrombosis with recent pain in the knee. 7. Ortho consult in the a.m. MRI as an outpatient showing subchondral insufficiency fracture.  The patient was admitted to the Critical Care Unit.  The husband did come in at the end of my dictation, and I was able to talk with him for a few minutes. No history of diabetes. No history of alcohol. Never a history of pancreatitis. Her gallbladder was removed. Will continue supportive care.  EKG did show a sinus tachycardia at 130 beats per minute, left ventricular hypertrophy, and Q         waves inferiorly.  TIME: Time spent on admission 55 minutes.     ____________________________ Herschell Dimesichard J. Renae GlossWieting, MD rjw:kma D: 01/06/2012 20:34:22 ET T: 01/07/2012 08:51:03 ET JOB#: [id number]  cc: Herschell Dimesichard J. Renae GlossWieting, MD, <Dictator> Salley ScarletICHARD J Rainee Sweatt MD ELECTRONICALLY SIGNED 01/15/2012 14:04

## 2014-12-07 NOTE — Discharge Summary (Signed)
PATIENT NAME:  Charlene Parker, Charlene Parker MR#:  [id number] DATE OF BIRTH:  02-Mar-1951  DATE OF ADMISSION:  01/06/2012 DATE OF DISCHARGE:  01/11/2012  For a detailed note, please take a look at the history and physical done on admission by Dr. Alford Highlandichard Wieting.   DIAGNOSES AT DISCHARGE:  1. Diabetic ketoacidosis secondary to new onset diabetes.  2. Altered mental status and encephalopathy secondary to underlying urinary tract infection, hypernatremia, and diabetic ketoacidosis. 3. Acute hypernatremia, now resolved.  4. Acute renal failure.  5. Urinary tract infection.  6. Osteoarthritis. 7. Acute pancreatitis.   DIET: The patient is being discharged on a low sodium, American Diabetic Association diet.   ACTIVITY: As tolerated.   FOLLOW-UP: Follow-up with Dr. Dewaine Oatsenny Tate in the next 1 to 2 weeks.   DISCHARGE MEDICATIONS:   1. Meloxicam 15 mg daily as needed.  2. Norco 5/325 1 tab q.6 hours as needed.  3. Lantus 60 units sub-Q at bedtime.  4. NovoLog 15 units t.i.d. with meals. 5. Ciprofloxacin 250 mg b.i.d. x3 days.   PERTINENT STUDIES DONE DURING THE HOSPITAL COURSE:  1. CT scan of the head done without contrast showing no evidence of acute intracranial abnormalities.  2. X-ray of the right knee showing no evidence of radio-overt osseous abnormalities.  3. Ultrasound of the abdomen done showing limited abdominal ultrasound. Findings are consistent with a cyst within the left lobe of the liver. Otherwise, no acute abnormalities.   HOSPITAL COURSE: This is a 64 year old female with multiple medical problems who presented to the hospital due to altered mental status and encephalopathy and noted to be in diabetic ketoacidosis.  1. Altered mental status and encephalopathy. The etiology of this is most likely multifactorial related to her diabetic ketoacidosis with underlying renal failure, hypernatremia, and also urinary tract infection. The patient was treated with IV antibiotics for urinary tract  infection and also started on insulin drip with aggressive IV fluids for her DKA. Since her diabetic ketoacidosis has resolved and her urinary tract infection has been treated, her mental status is now back down to baseline. Her sodium was also improved since admission. It was as high as 159, is down to 148. Her CT of the head was negative. Her urine toxicology was negative. Chest x-ray did not show any evidence of acute cardiopulmonary disease.  2. Diabetic ketoacidosis. The patient has no previous history of diabetes. Her hemoglobin A1c was noted to be as high as 11. She was treated aggressively initially with IV fluids, insulin drip. Her anion gap has now closed. She has been taken off the insulin drip and currently is on Lantus and NovoLog with meals. She is currently being discharged on that. She has received diabetic lifestyle treatment and has been taught how to give herself insulin. She will follow-up with Dr. Dewaine Oatsenny Tate for further change in her diabetic meds.  3. Hypernatremia. The patient's sodium was as high as 159 during the hospital course. On the day of discharge it is 150. Although the patient is taking p.o. well, her mental status is now back to baseline, therefore, there is no acute issue related to this.  4. Acute renal failure. This was likely secondary to DKA and dehydration. It has since then resolved with IV fluids. Her creatinine is now back down to baseline.  5. Acute pancreatitis. When the patient presented to the hospital she was having significant abdominal pain. Her lipase was greater than 3000. She had a lipid profile checked which was negative. She does  not have any history of alcohol abuse. Her ultrasound of the abdomen did not show any evidence of acute abnormalities. I suspect this was likely related to underlying DKA. Her lipase has since then normalized and so has her pain and her clinical symptoms. Currently she is tolerating a diabetic low fat diet well. Therefore, she is  being discharged.  6. Leukocytosis. This was secondary to possibly DKA and underlying urinary tract infection and it has since resolved with treatment with IV antibiotics.  7. Right knee pain and osteoarthritis. The patient had a MRI done as an outpatient prior to coming to the hospital which showed a subchondral fracture. An Orthopedic consult was obtained and as per them there is no acute intervention needed for this at this time. The patient will continue follow-up with Orthopedics as an outpatient.  8. Oral thrush. The patient was treated with Diflucan and nystatin swish and swallow and has since then resolved.   9. Urinary tract infection. The patient has been treated with IV ceftriaxone in the hospital and is currently being discharged on p.o. ciprofloxacin. Urine cultures are growing Escherichia coli sensitive to Cipro.   TIME SPENT WITH THE DISCHARGE: 40 minutes.   ____________________________ Rolly Pancake. Cherlynn Kaiser, MD vjs:drc D: 01/11/2012 15:45:28 ET T: 01/12/2012 12:11:30 ET JOB#: [id number]  cc: Rolly Pancake. Cherlynn Kaiser, MD, <Dictator> Jillene Bucks. Arlana Pouch, MD [place] Siren MD ELECTRONICALLY SIGNED 01/13/2012 16:15

## 2014-12-14 NOTE — Discharge Summary (Signed)
PATIENT NAME:  Charlene Parker, Charlene Parker MR#:  [id number] DATE OF BIRTH:  07-08-1951  DATE OF ADMISSION:  11/11/2014 DATE OF DISCHARGE:  11/13/2014  ADMITTING PHYSICIAN: Enid Baas, MD   DISCHARGING PHYSICIAN: Enid Baas, MD   PRIMARY CARE PHYSICIAN: Jillene Bucks. Arlana Pouch, MD   CONSULTATIONS IN HOSPITAL: None.   DISCHARGE DIAGNOSES: 1. Diabetic ketoacidosis.  2. Type 2 diabetes mellitus, uncontrolled.  3. Acute renal failure.  4. Hypernatremia and hypercalcemia, secondary to dehydration.   DISCHARGE HOME MEDICATIONS:  1. Lantus 22 units subcutaneous at time.  2. Metformin 1000 mg p.o. b.i.d.  3. Metoprolol 25 mg p.o. b.i.d.   DISCHARGE DIET: Carbohydrate-controlled diet.   DISCHARGE ACTIVITY: As tolerated.   FOLLOWUP INSTRUCTIONS: 1. Home with home health nursing.  2. PCP follow-up in 1 to 2 weeks.  3. Follow a low-carbohydrate diet.  4. Advised to check sugars at least twice a day and call MD if fasting greater than 200 or random glucose greater than 300 or any sugars consistently less than 60.   LABORATORIES AND IMAGING STUDIES PRIOR TO DISCHARGE: Sodium 148, potassium 4.1, chloride 120, bicarbonate 21, BUN 28, creatinine 0.76, glucose 292, calcium 10.1. WBC 9.9, hemoglobin 15.1, hematocrit 48.0, platelet count 184,000. Chest x-ray showing slight cardiac enlargement, low lung volumes. No infiltrates, failure, effusion or pneumothorax. Urinalysis: Negative for any infection. Beta-hydroxybutyrate was elevated on admission and blood sugars were greater than 600. HbA1c is 10.6.    BRIEF HOSPITAL COURSE: Ms. Charlene Parker is a 64 year old African American female with past medical history significant for known history of type 2 diabetes mellitus, who is very noncompliant with her diet, presents to the hospital secondary to altered mental status and hyperglycemia and noted to have sugars greater than 600.  1. Diabetic ketoacidosis secondary to uncontrolled type 2 diabetes mellitus. The  patient was diagnosed with diabetes about 2 years ago. Discharged on Lantus at the time and discharged on insulin at the time. According to husband, she has being following with her PCP. They were able to get her off the insulin, though she has supplies of Lantus and NovoLog at home, up until June of this year. According to him and the patient, she only takes metformin at home and is supposed to be started on a different pill, which she is not taking yet. She was worried about the side effects. She started on insulin drip, that was in the intensive care unit. Her sugars are better controlled in 200s now. She is being discharged on Lantus 22 units and also her metformin. Her A1c  is 10.6 here, but she did have an A1c of 7.8 based on her blood work from February. It was noticed that she is very noncompliant with diet, all she likes and wants is the carbohydrate-rich foods. Diabetes education was given to the patient, and she is being discharged.  2. Acute renal failure and electrolyte abnormalities were corrected, most of them due to dehydration on presentation, prerenal causes that improved with adjustments and fluids.  3. Her course has been otherwise uneventful in the hospital. The patient did ambulate with the help of a cane prior to discharge, which is her baseline, and remained steady.   DISCHARGE CONDITION: Stable.   DISCHARGE DISPOSITION: Home with home health nursing.   TIME SPENT ON DISCHARGE: 45 minutes.     ____________________________ Enid Baas, MD rk:mw D: 11/13/2014 13:42:32 ET T: 11/13/2014 13:53:58 ET JOB#: [id number]  cc: Enid Baas, MD, <Dictator> Jillene Bucks. Arlana Pouch, MD Enid Baas MD ELECTRONICALLY  SIGNED 11/20/2014 12:25

## 2014-12-14 NOTE — H&P (Signed)
PATIENT NAME:  Charlene Parker, Charlene Parker MR#:  [id number] DATE OF BIRTH:  1951/03/15  DATE OF ADMISSION:  11/11/2014  ADMITTING PHYSICIAN: Enid Baas, M.D.   PRIMARY CARE PHYSICIAN: Jillene Bucks. Arlana Pouch, M.D.   CHIEF COMPLAINT: Confusion and altered mental status.   HISTORY OF PRESENT ILLNESS: Charlene Parker is a 64 year old, African American female, with past medical history significant for diabetes mellitus diagnosed about 3 to 4 years ago. Has been on metformin and novolog for the last couple of years, doing well. Was brought in from home secondary to elevated sugars and altered mental status. The patient is arousable, able to follow commands, able to provide history, but history was also verified from her husband over the phone. The patient's laboratories from February 2016 were sent in from the PCP's office. It seems like she has normal creatinine at baseline. Her HbA1c in February was 7.8, and sugar was in 200s at the time. The patient was started on another diabetic pill, but she says that she was worried about the liver side effects that were mentioned and never took that medication. Over the last 3 or 4 days, she has not had any fevers, chills, nausea or vomiting, but she has been feeling weak, not being very active as herself. Blood sugars checked yesterday were in the 500 range. The husband called the PCP and they recommended giving her some NovoLog for sugars that have been high. However, the patient continued to get worse, so was brought to the hospital and noted to be in DKA. She has an elevated anion gap of 22 with creatinine worse at 1.85, sugars in the 600 range. She also has an elevated white count of 18,000 and is being admitted for DKA.   PAST MEDICAL HISTORY: Type 2 diabetes mellitus, not on any insulin.   PAST SURGICAL HISTORY: C-section and right knee meniscal repair.  ALLERGIES TO MEDICATIONS: STEROIDS.   CURRENT HOME MEDICATIONS:  1. Vitamin D p.o. daily, unknown dose.  2. Metformin 500  mg 2 tablets p.o. b.i.d.   SOCIAL HISTORY: Lives at home with her husband, has a cane, but usually is independent in her activities and walks without any assistive devices. No smoking or alcohol use.   FAMILY HISTORY: Mom with diabetes, heart problems, and maternal aunt with breast cancer.   REVIEW OF SYSTEMS: Difficult to be obtained secondary to the patient's mental status.   PHYSICAL EXAMINATION:  VITAL SIGNS: Temperature 97.8 degrees Fahrenheit, pulse 118, respirations 18, blood pressure 149/93, pulse of 95% on room air.  GENERAL: Well-built, well-nourished female, lying in bed, not in any acute distress.  HEENT: Normocephalic, atraumatic. Pupils equal, round, reacting to light. Anicteric sclerae. Extraocular movements intact. Oropharynx clear without erythema, mass, or exudates.  NECK: Supple. No thyromegaly, JVD or carotid bruits. No lymphadenopathy.  LUNGS: Moving air bilaterally. No wheeze or crackles. No use of accessory muscles for breathing.  CARDIOVASCULAR: S1, S2, regular rate and rhythm. No murmurs, rubs, or gallops.  ABDOMEN: Soft, nontender, nondistended. No hepatosplenomegaly. Normal bowel sounds.  EXTREMITIES: No pedal edema. No clubbing or cyanosis; 2+ dorsalis pedis pulses palpable bilaterally.  SKIN: No acne, rash or lesions.  LYMPHATICS: No cervical lymphadenopathy.  NEUROLOGIC: The patient is arousable. No cranial nerve deficit, able to follow commands. No new focal, sensory or motor deficits noted.    LABORATORY DATA: WBC 18.8, hemoglobin 17.7, hematocrit 57.2, platelet count 293,000.   Sodium 140, potassium 5.1, chloride 101, bicarbonate 17, BUN 54, creatinine 1.8, glucose 598, calcium of 11.3.  ALT 24, AST 24, alkaline phosphatase 133, total bilirubin 1.9, and albumin of 4.9.   Troponin is 0.04 and beta-hydroxybutyric is elevated at 6.8.   Urinalysis negative for any infection.   Chest x-ray showing slight cardiac enlargement, low lung volumes. No effusion  pneumothorax, or failure noted.   ASSESSMENT AND PLAN: This is a 64 year old female with past medical history of diabetes, non-insulin-dependent, admitted for lethargy and noted to be in diabetic ketoacidosis.   1. Diabetic ketoacidosis, type 2 diabetes on metformin. Last A1c in February 2016 was 7.8, has been compliant with medications as told by family. Sugars have been elevated. No infection noted. Will admit to critical care unit and place her on an insulin drip for now. Check A1c again and adjust medications at discharge.  2. Acute renal failure, prerenal. I will start IV fluids and monitor for normal creatinine at baseline.  3. Hypercalcemia secondary to dehydration. IV fluids and monitor.  4. Leukocytosis, no indication for infection. Hold off antibiotics. Could be stress reaction. Monitor for now.  5. Deep vein thrombosis prophylaxis.   CODE STATUS: FULL CODE.   TOTAL CRITICAL CARE TIME SPENT ON ADMITTING THIS PATIENT: 60 minutes.     ____________________________ Enid Baasadhika Deaven Barron, MD rk:JT D: 11/11/2014 13:17:12 ET T: 11/11/2014 13:50:35 ET JOB#: [id number]  cc: Enid Baasadhika Emy Angevine, MD, <Dictator> Jillene Bucksenny C. Arlana Pouchate, MD Enid BaasADHIKA Zarina Pe MD ELECTRONICALLY SIGNED 11/20/2014 12:36

## 2015-04-13 ENCOUNTER — Ambulatory Visit: Payer: Self-pay | Admitting: Podiatry

## 2015-04-14 ENCOUNTER — Ambulatory Visit
Admission: RE | Admit: 2015-04-14 | Discharge: 2015-04-14 | Disposition: A | Discharge status: Home / Self Care | Payer: 59 | Source: Ambulatory Visit | Attending: Internal Medicine | Admitting: Internal Medicine

## 2015-04-14 ENCOUNTER — Other Ambulatory Visit: Payer: Self-pay | Admitting: Internal Medicine

## 2015-04-14 DIAGNOSIS — Z1231 Encounter for screening mammogram for malignant neoplasm of breast: Secondary | ICD-10-CM

## 2015-05-06 ENCOUNTER — Ambulatory Visit (INDEPENDENT_AMBULATORY_CARE_PROVIDER_SITE_OTHER): Payer: 59

## 2015-05-06 ENCOUNTER — Ambulatory Visit (INDEPENDENT_AMBULATORY_CARE_PROVIDER_SITE_OTHER): Payer: 59 | Admitting: Podiatry

## 2015-05-06 ENCOUNTER — Encounter: Payer: Self-pay | Admitting: Podiatry

## 2015-05-06 VITALS — BP 145/70 | HR 78 | Resp 16 | Ht 63.0 in | Wt 237.0 lb

## 2015-05-06 DIAGNOSIS — M722 Plantar fascial fibromatosis: Secondary | ICD-10-CM

## 2015-05-06 DIAGNOSIS — E119 Type 2 diabetes mellitus without complications: Secondary | ICD-10-CM | POA: Diagnosis not present

## 2015-05-06 LAB — HM DIABETES FOOT EXAM

## 2015-05-06 MED ORDER — MELOXICAM 15 MG PO TABS: 15.0000 mg | ORAL_TABLET | Freq: Every day | ORAL | Status: DC

## 2015-05-06 NOTE — Progress Notes (Signed)
   Subjective:    Patient ID: Charlene Parker, female    DOB: 12-30-1950, 64 y.o.   MRN: [id number]  HPI Comments:   Diabetic x 3 years and last A1C was 6.6  she presents today as a 64 year old black female with a history of diabetes 3 years. States that her current A1c is a 6.6. She states that she's had bilateral foot pain left greater than right for several months now. He states this seems to be shooting up the lateral aspect of the leg and foot as well. Mornings are particularly bad and after she's been walking for long periods of time. She presents today in tennis shoes and states that tennis shoes and open heel shoes feel much better.    Review of Systems  Constitutional: Positive for diaphoresis and fatigue.  Eyes: Positive for visual disturbance.  Musculoskeletal: Positive for gait problem.  Skin: Positive for color change.  All other systems reviewed and are negative.      Objective:   Physical Exam: 64 year old female. Pleasant disposition acute distress. Pulses are strongly palpable bilateral. Neurologic sensorium is intact per Semmes-Weinstein monofilament. Deep tendon reflexes are brisk and equal bilateral. Muscle strength +5 over 5 dorsiflexion plantar flexors and inverters everters all intrinsic musculature is in tact. Orthopedic evaluation demonstrates all joints distal to the ankle for range of motion without crepitation. Pes planus is flexible in nature. She also has pain on palpation medial calcaneal tubercle left greater than right foot. Cutaneous evaluation demonstrates supple well-hydrated cutis no erythema edema cellulitis drainage or odor. Radiographs taken today demonstrate plantar distally oriented calcaneal heel spurs with pes planus a soft tissue increase in density at the plantar fascial calcaneal insertion site indicative of plantar fasciitis bilateral.        Assessment & Plan:  Plantar fasciitis bilateral.  Plan: Injected her left heel today with Kenalog  and local anesthesia. Discussed appropriate shoe gear stretching exercises and ice therapy. Placed her in a plantar fascial brace bilaterally and a night splint left. Dispensed a prescription for meloxicam. I will follow-up with her in 1 month.

## 2015-05-06 NOTE — Patient Instructions (Signed)

## 2015-06-17 ENCOUNTER — Encounter: Payer: Self-pay | Admitting: Podiatry

## 2015-06-17 ENCOUNTER — Ambulatory Visit (INDEPENDENT_AMBULATORY_CARE_PROVIDER_SITE_OTHER): Payer: 59 | Admitting: Podiatry

## 2015-06-17 DIAGNOSIS — M722 Plantar fascial fibromatosis: Secondary | ICD-10-CM | POA: Diagnosis not present

## 2015-06-18 NOTE — Progress Notes (Signed)
She presents today for follow-up of her plantar fasciitis of her left foot. She states it is approximately 80% improved. She states it has started to regress over the past week after feeling really good for approximately a month. She states that she did not take the meloxicam secondary to fear of kidney issues because she has diabetes.  Objective: Vital signs are stable she is alert and oriented 3. She has pain on palpation medial calcaneal tubercle of the left heel. Pulses are strongly palpable left.  Assessment: Plantar fasciitis left foot.  Plan: Reinjected her left foot today and try to encourage her to take medications. I will follow-up with her in 1 month for orthotics if necessary.

## 2015-07-22 ENCOUNTER — Ambulatory Visit: Payer: 59 | Admitting: Podiatry

## 2016-05-24 ENCOUNTER — Other Ambulatory Visit: Payer: Self-pay | Admitting: Internal Medicine

## 2016-05-24 DIAGNOSIS — Z1231 Encounter for screening mammogram for malignant neoplasm of breast: Secondary | ICD-10-CM

## 2016-06-29 ENCOUNTER — Ambulatory Visit
Admission: RE | Admit: 2016-06-29 | Discharge: 2016-06-29 | Disposition: A | Discharge status: Home / Self Care | Payer: 59 | Source: Ambulatory Visit | Attending: Internal Medicine | Admitting: Internal Medicine

## 2016-06-29 DIAGNOSIS — Z1231 Encounter for screening mammogram for malignant neoplasm of breast: Secondary | ICD-10-CM | POA: Diagnosis present

## 2017-05-22 ENCOUNTER — Other Ambulatory Visit: Payer: Self-pay | Admitting: Internal Medicine

## 2017-05-22 DIAGNOSIS — Z1231 Encounter for screening mammogram for malignant neoplasm of breast: Secondary | ICD-10-CM

## 2017-05-31 ENCOUNTER — Ambulatory Visit
Admission: RE | Admit: 2017-05-31 | Discharge: 2017-05-31 | Disposition: A | Discharge status: Home / Self Care | Payer: 59 | Source: Ambulatory Visit | Attending: Internal Medicine | Admitting: Internal Medicine

## 2017-05-31 ENCOUNTER — Encounter: Payer: Self-pay | Admitting: Obstetrics and Gynecology

## 2017-05-31 ENCOUNTER — Ambulatory Visit (INDEPENDENT_AMBULATORY_CARE_PROVIDER_SITE_OTHER): Payer: 59 | Admitting: Obstetrics and Gynecology

## 2017-05-31 VITALS — BP 130/80 | HR 66 | Ht 63.0 in | Wt 250.0 lb

## 2017-05-31 DIAGNOSIS — Z1231 Encounter for screening mammogram for malignant neoplasm of breast: Secondary | ICD-10-CM | POA: Insufficient documentation

## 2017-05-31 DIAGNOSIS — Z803 Family history of malignant neoplasm of breast: Secondary | ICD-10-CM | POA: Diagnosis not present

## 2017-05-31 DIAGNOSIS — Z01419 Encounter for gynecological examination (general) (routine) without abnormal findings: Secondary | ICD-10-CM | POA: Diagnosis not present

## 2017-05-31 DIAGNOSIS — Z1239 Encounter for other screening for malignant neoplasm of breast: Secondary | ICD-10-CM

## 2017-05-31 DIAGNOSIS — Z1211 Encounter for screening for malignant neoplasm of colon: Secondary | ICD-10-CM

## 2017-05-31 LAB — HEMOCCULT GUIAC POC 1CARD (OFFICE): FECAL OCCULT BLD: NEGATIVE

## 2017-05-31 NOTE — Progress Notes (Signed)
PCP: Jaclyn Shaggy, MD   Chief Complaint  Patient presents with  . Gynecologic Exam    HPI:      Ms. Charlene Parker is a 66 y.o. [id number] who LMP was No LMP recorded. Patient is postmenopausal., presents today for her annual examination.  Her menses are absent due to menopause.  She does not have intermenstrual bleeding.  She does have occas vasomotor sx.  Sex activity: not sexually active. She does not have vaginal dryness.  Last Pap: June 15, 2016  Results were: no abnormalities /neg HPV DNA.  Hx of STDs: none  Last mammogram: June 29, 2016  Results were: normal--routine follow-up in 12 months There is a FH of breast cancer in 2 mat aunts and mat cousins, genetic testing not done. There is no FH of ovarian cancer. The patient does do self-breast exams.  Colonoscopy: in 73s., past due  Tobacco use: The patient denies current or previous tobacco use. Alcohol use: none Exercise: not active  She does get adequate calcium and Vitamin D in her diet.  Labs with PCP.    Past Medical History:  Diagnosis Date  . Diabetes mellitus without complication (HCC)   . Hypertension     Past Surgical History:  Procedure Laterality Date  . CESAREAN SECTION    . CHOLECYSTECTOMY    . GALLBLADDER SURGERY    . KNEE SURGERY Right     Family History  Problem Relation Age of Onset  . Breast cancer Maternal Aunt        18s  . Diabetes Maternal Aunt   . Heart Problems Maternal Aunt   . Diabetes Mother   . Breast cancer Maternal Aunt 93  . Breast cancer Cousin     Social History   Social History  . Marital status: Married    Spouse name: N/A  . Number of children: N/A  . Years of education: N/A   Occupational History  . Not on file.   Social History Main Topics  . Smoking status: Never Smoker  . Smokeless tobacco: Never Used  . Alcohol use No  . Drug use: No  . Sexual activity: Not Currently   Other Topics Concern  . Not on file   Social History Narrative  .  No narrative on file    No outpatient prescriptions have been marked as taking for the 05/31/17 encounter (Office Visit) with Copland, Ilona Sorrel, PA-C.      ROS:  Review of Systems  Constitutional: Negative for fatigue, fever and unexpected weight change.  Respiratory: Negative for cough, shortness of breath and wheezing.   Cardiovascular: Negative for chest pain, palpitations and leg swelling.  Gastrointestinal: Negative for blood in stool, constipation, diarrhea, nausea and vomiting.  Endocrine: Negative for cold intolerance, heat intolerance and polyuria.  Genitourinary: Negative for dyspareunia, dysuria, flank pain, frequency, genital sores, hematuria, menstrual problem, pelvic pain, urgency, vaginal bleeding, vaginal discharge and vaginal pain.  Musculoskeletal: Negative for back pain, joint swelling and myalgias.  Skin: Negative for rash.  Neurological: Negative for dizziness, syncope, light-headedness, numbness and headaches.  Hematological: Negative for adenopathy.  Psychiatric/Behavioral: Negative for agitation, confusion, sleep disturbance and suicidal ideas. The patient is not nervous/anxious.      Objective: BP 130/80   Pulse 66   Ht 5\' 3"  (1.6 m)   Wt 250 lb (113.4 kg)   BMI 44.29 kg/m    Physical Exam  Constitutional: She is oriented to person, place, and time. She appears well-developed and  well-nourished.  Genitourinary: Vagina normal and uterus normal. There is no rash or tenderness on the right labia. There is no rash or tenderness on the left labia. No erythema or tenderness in the vagina. No vaginal discharge found. Right adnexum does not display mass and does not display tenderness. Left adnexum does not display mass and does not display tenderness. Cervix does not exhibit motion tenderness or polyp. Uterus is not enlarged or tender. Rectal exam shows external hemorrhoid. Rectal exam shows no fissure, no tenderness and guaiac negative stool.  Neck: Normal  range of motion. No thyromegaly present.  Cardiovascular: Normal rate, regular rhythm and normal heart sounds.   No murmur heard. Pulmonary/Chest: Effort normal and breath sounds normal. Right breast exhibits no mass, no nipple discharge, no skin change and no tenderness. Left breast exhibits no mass, no nipple discharge, no skin change and no tenderness.  Abdominal: Soft. There is no tenderness. There is no guarding.  Musculoskeletal: Normal range of motion.  Neurological: She is alert and oriented to person, place, and time. No cranial nerve deficit.  Psychiatric: She has a normal mood and affect. Her behavior is normal.  Vitals reviewed.   Results: Results for orders placed or performed in visit on 05/31/17 (from the past 24 hour(s))  POCT Occult Blood Stool     Status: Normal   Collection Time: 05/31/17 11:18 AM  Result Value Ref Range   Fecal Occult Blood, POC Negative Negative   Card #1 Date     Card #2 Fecal Occult Blod, POC     Card #2 Date     Card #3 Fecal Occult Blood, POC     Card #3 Date      Assessment/Plan:  Encounter for annual routine gynecological examination  Screening for breast cancer - Pt has mammo sched through PCP.   Family history of breast cancer - Doesn't meed medicare genetic testing guidelines.   Screening for colon cancer - Neg FOBT. Recommended scr colonoscopy due to age. Pt to consider and f/u for ref prn. - Plan: POCT Occult Blood Stool          GYN counsel mammography screening, menopause, adequate intake of calcium and vitamin D, diet and exercise    F/U  Return in about 1 year (around 05/31/2018) for annual.  Alicia B. Copland, PA-C 05/31/2017 11:26 AM

## 2017-06-21 ENCOUNTER — Other Ambulatory Visit: Payer: Self-pay | Admitting: Orthopedic Surgery

## 2017-06-21 DIAGNOSIS — M25562 Pain in left knee: Secondary | ICD-10-CM

## 2017-06-30 ENCOUNTER — Ambulatory Visit
Admission: RE | Admit: 2017-06-30 | Discharge: 2017-06-30 | Disposition: A | Discharge status: Home / Self Care | Payer: 59 | Source: Ambulatory Visit | Attending: Orthopedic Surgery | Admitting: Orthopedic Surgery

## 2017-06-30 DIAGNOSIS — M949 Disorder of cartilage, unspecified: Secondary | ICD-10-CM | POA: Diagnosis not present

## 2017-06-30 DIAGNOSIS — M25562 Pain in left knee: Secondary | ICD-10-CM

## 2017-06-30 DIAGNOSIS — X58XXXA Exposure to other specified factors, initial encounter: Secondary | ICD-10-CM | POA: Diagnosis not present

## 2017-06-30 DIAGNOSIS — S83242A Other tear of medial meniscus, current injury, left knee, initial encounter: Secondary | ICD-10-CM | POA: Diagnosis not present

## 2017-06-30 DIAGNOSIS — M25462 Effusion, left knee: Secondary | ICD-10-CM | POA: Diagnosis not present

## 2017-07-12 ENCOUNTER — Other Ambulatory Visit: Payer: Self-pay

## 2017-07-12 ENCOUNTER — Encounter: Payer: Self-pay | Admitting: *Deleted

## 2017-07-20 ENCOUNTER — Ambulatory Visit: Payer: 59 | Admitting: Anesthesiology

## 2017-07-20 ENCOUNTER — Ambulatory Visit
Admission: RE | Admit: 2017-07-20 | Discharge: 2017-07-20 | Disposition: A | Discharge status: Home / Self Care | Payer: 59 | Source: Ambulatory Visit | Attending: Orthopedic Surgery | Admitting: Orthopedic Surgery

## 2017-07-20 ENCOUNTER — Encounter
Admission: RE | Disposition: A | Discharge status: Home / Self Care | Payer: Self-pay | Source: Ambulatory Visit | Attending: Orthopedic Surgery

## 2017-07-20 DIAGNOSIS — Z6841 Body Mass Index (BMI) 40.0 and over, adult: Secondary | ICD-10-CM | POA: Diagnosis not present

## 2017-07-20 DIAGNOSIS — M1711 Unilateral primary osteoarthritis, right knee: Secondary | ICD-10-CM | POA: Diagnosis not present

## 2017-07-20 DIAGNOSIS — M23231 Derangement of other medial meniscus due to old tear or injury, right knee: Secondary | ICD-10-CM | POA: Insufficient documentation

## 2017-07-20 DIAGNOSIS — I10 Essential (primary) hypertension: Secondary | ICD-10-CM | POA: Insufficient documentation

## 2017-07-20 DIAGNOSIS — K219 Gastro-esophageal reflux disease without esophagitis: Secondary | ICD-10-CM | POA: Insufficient documentation

## 2017-07-20 HISTORY — PX: KNEE ARTHROSCOPY WITH MENISCAL REPAIR: SHX5653

## 2017-07-20 HISTORY — PX: CHONDROPLASTY: SHX5177

## 2017-07-20 HISTORY — DX: Gastro-esophageal reflux disease without esophagitis: K21.9

## 2017-07-20 HISTORY — DX: Unspecified osteoarthritis, unspecified site: M19.90

## 2017-07-20 LAB — GLUCOSE, CAPILLARY
Glucose-Capillary: 184 mg/dL — ABNORMAL HIGH (ref 65–99)
Glucose-Capillary: 102 mg/dL — ABNORMAL HIGH (ref 65–99)

## 2017-07-20 SURGERY — ARTHROSCOPY, KNEE, WITH MENISCUS REPAIR
Anesthesia: General | Site: Knee | Laterality: Left | Wound class: Clean

## 2017-07-20 MED ORDER — SCOPOLAMINE 1 MG/3DAYS TD PT72: 1.0000 | MEDICATED_PATCH | Freq: Once | TRANSDERMAL | Status: DC

## 2017-07-20 MED ORDER — GLYCOPYRROLATE 0.2 MG/ML IJ SOLN
INTRAMUSCULAR | Status: DC | PRN
  Administered 2017-07-20: 0.1 mg via INTRAVENOUS

## 2017-07-20 MED ORDER — ONDANSETRON HCL 4 MG/2ML IJ SOLN
INTRAMUSCULAR | Status: DC | PRN
  Administered 2017-07-20: 4 mg via INTRAVENOUS

## 2017-07-20 MED ORDER — ACETAMINOPHEN 10 MG/ML IV SOLN: 1000.0000 mg | Freq: Once | INTRAVENOUS | Status: DC | PRN

## 2017-07-20 MED ORDER — OXYCODONE HCL 5 MG PO TABS: 5.0000 mg | ORAL_TABLET | Freq: Once | ORAL | Status: DC | PRN

## 2017-07-20 MED ORDER — FENTANYL CITRATE (PF) 100 MCG/2ML IJ SOLN
25.0000 ug | INTRAMUSCULAR | Status: DC | PRN
  Administered 2017-07-20: 25 ug via INTRAVENOUS

## 2017-07-20 MED ORDER — ROPIVACAINE HCL 5 MG/ML IJ SOLN
INTRAMUSCULAR | Status: DC | PRN
  Administered 2017-07-20: 15 mL via PERINEURAL

## 2017-07-20 MED ORDER — OXYCODONE HCL 5 MG PO TABS: 5.0000 mg | ORAL_TABLET | ORAL | 0 refills | Status: AC | PRN

## 2017-07-20 MED ORDER — FENTANYL CITRATE (PF) 100 MCG/2ML IJ SOLN: 25.0000 ug | INTRAMUSCULAR | Status: DC | PRN

## 2017-07-20 MED ORDER — LACTATED RINGERS IV SOLN: INTRAVENOUS | Status: DC

## 2017-07-20 MED ORDER — PROPOFOL 10 MG/ML IV BOLUS
INTRAVENOUS | Status: DC | PRN
  Administered 2017-07-20: 120 mg via INTRAVENOUS

## 2017-07-20 MED ORDER — CEFAZOLIN SODIUM-DEXTROSE 2-4 GM/100ML-% IV SOLN
2.0000 g | Freq: Once | INTRAVENOUS | Status: AC
  Administered 2017-07-20: 2 g via INTRAVENOUS

## 2017-07-20 MED ORDER — ASPIRIN EC 325 MG PO TBEC: 325.0000 mg | DELAYED_RELEASE_TABLET | Freq: Every day | ORAL | 0 refills | Status: AC

## 2017-07-20 MED ORDER — OXYCODONE HCL 5 MG/5ML PO SOLN: 5.0000 mg | Freq: Once | ORAL | Status: DC | PRN

## 2017-07-20 MED ORDER — ONDANSETRON HCL 4 MG/2ML IJ SOLN: 4.0000 mg | Freq: Once | INTRAMUSCULAR | Status: DC | PRN

## 2017-07-20 MED ORDER — ACETAMINOPHEN 500 MG PO TABS: 1000.0000 mg | ORAL_TABLET | Freq: Three times a day (TID) | ORAL | 2 refills | Status: AC

## 2017-07-20 MED ORDER — OXYCODONE HCL 5 MG PO TABS
5.0000 mg | ORAL_TABLET | Freq: Once | ORAL | Status: AC | PRN
  Administered 2017-07-20: 5 mg via ORAL

## 2017-07-20 MED ORDER — OXYCODONE HCL 5 MG/5ML PO SOLN: 5.0000 mg | Freq: Once | ORAL | Status: AC | PRN

## 2017-07-20 MED ORDER — LACTATED RINGERS IV SOLN
INTRAVENOUS | Status: DC
  Administered 2017-07-20 (×2): via INTRAVENOUS

## 2017-07-20 MED ORDER — ONDANSETRON 4 MG PO TBDP: 4.0000 mg | ORAL_TABLET | Freq: Three times a day (TID) | ORAL | 0 refills | Status: DC | PRN

## 2017-07-20 MED ORDER — ACETAMINOPHEN 10 MG/ML IV SOLN
1000.0000 mg | Freq: Once | INTRAVENOUS | Status: AC
  Administered 2017-07-20: 1000 mg via INTRAVENOUS

## 2017-07-20 MED ORDER — MIDAZOLAM HCL 2 MG/2ML IJ SOLN
INTRAMUSCULAR | Status: DC | PRN
  Administered 2017-07-20 (×2): 1 mg via INTRAVENOUS

## 2017-07-20 MED ORDER — PROPOFOL 500 MG/50ML IV EMUL
INTRAVENOUS | Status: DC | PRN
  Administered 2017-07-20: 100 ug/kg/min via INTRAVENOUS

## 2017-07-20 MED ORDER — FENTANYL CITRATE (PF) 100 MCG/2ML IJ SOLN
INTRAMUSCULAR | Status: DC | PRN
  Administered 2017-07-20 (×5): 25 ug via INTRAVENOUS
  Administered 2017-07-20: 50 ug via INTRAVENOUS

## 2017-07-20 SURGICAL SUPPLY — 67 items
ADAPTER IRRIG TUBE 2 SPIKE SOL (ADAPTER) ×2 IMPLANT
ADPR TBG 2 SPK PMP STRL ASCP (ADAPTER) ×1
BLADE SURG 15 STRL LF DISP TIS (BLADE) ×1 IMPLANT
BLADE SURG 15 STRL SS (BLADE) ×2
BLADE SURG SZ11 CARB STEEL (BLADE) ×2 IMPLANT
BNDG COHESIVE 4X5 TAN STRL (GAUZE/BANDAGES/DRESSINGS) ×2 IMPLANT
BNDG ESMARK 6X12 TAN STRL LF (GAUZE/BANDAGES/DRESSINGS) ×1 IMPLANT
BRACE KNEE POST OP SHORT (BRACE) ×2 IMPLANT
BRUSH SCRUB EZ  4% CHG (MISCELLANEOUS) ×1
BRUSH SCRUB EZ 4% CHG (MISCELLANEOUS) ×1 IMPLANT
BUR RADIUS 3.5 (BURR) ×2 IMPLANT
BUTTON SUTURE 12 NAB (SUTURE) ×2 IMPLANT
CETERIX ORTHOPAEDICS NOVOSTITCH MENISCAL REPAIR CA ×1 IMPLANT
CETERIX ORTHOPAEDICS NOVOSTITCH PRO-MENISCAL REPAI ×1 IMPLANT
CHLORAPREP W/TINT 26ML (MISCELLANEOUS) ×2 IMPLANT
CLEANER CAUTERY TIP 5X5 PAD (MISCELLANEOUS) ×1 IMPLANT
COOLER POLAR GLACIER W/PUMP (MISCELLANEOUS) ×2 IMPLANT
CUFF TOURN SGL QUICK 24 (TOURNIQUET CUFF)
CUFF TOURN SGL QUICK 30 (MISCELLANEOUS)
CUFF TOURN SGL QUICK 34 (TOURNIQUET CUFF) ×2
CUFF TRNQT CYL 24X4X40X1 (TOURNIQUET CUFF) IMPLANT
CUFF TRNQT CYL 34X4X40X1 (TOURNIQUET CUFF) ×1 IMPLANT
CUFF TRNQT CYL LO 30X4X (MISCELLANEOUS) IMPLANT
DRAPE IMP U-DRAPE 54X76 (DRAPES) ×2 IMPLANT
DRILL FLIPCUTTER II W/DRILL 6 (INSTRUMENTS) ×1 IMPLANT
FLIPCUTTER II W/DRILL 6 (INSTRUMENTS) ×2
GAUZE SPONGE 4X4 12PLY STRL (GAUZE/BANDAGES/DRESSINGS) ×2 IMPLANT
GLOVE BIO SURGEON STRL SZ7.5 (GLOVE) ×2 IMPLANT
GLOVE BIOGEL PI IND STRL 8 (GLOVE) ×1 IMPLANT
GLOVE BIOGEL PI INDICATOR 8 (GLOVE) ×1
GOWN STRL REUS W/ TWL LRG LVL3 (GOWN DISPOSABLE) ×1 IMPLANT
GOWN STRL REUS W/TWL LRG LVL3 (GOWN DISPOSABLE) ×4 IMPLANT
IV LACTATED RINGER IRRG 3000ML (IV SOLUTION) ×26
IV LR IRRIG 3000ML ARTHROMATIC (IV SOLUTION) ×4 IMPLANT
KIT RM TURNOVER STRD PROC AR (KITS) ×2 IMPLANT
MAT BLUE FLOOR 46X72 FLO (MISCELLANEOUS) ×2 IMPLANT
NDL MAYO CATGUT SZ5 (NEEDLE)
NDL SUT 5 .5 CRC TPR PNT MAYO (NEEDLE) ×1 IMPLANT
NEPTUNE MANIFOLD (MISCELLANEOUS) ×3 IMPLANT
PACK ARTHROSCOPY KNEE (MISCELLANEOUS) ×2 IMPLANT
PAD ABD DERMACEA PRESS 5X9 (GAUZE/BANDAGES/DRESSINGS) ×4 IMPLANT
PAD CLEANER CAUTERY TIP 5X5 (MISCELLANEOUS) ×1
PAD WRAPON POLAR KNEE (MISCELLANEOUS) ×1 IMPLANT
PADDING CAST BLEND 6X4 STRL (MISCELLANEOUS) IMPLANT
PADDING STRL CAST 6IN (MISCELLANEOUS) ×1
PENCIL ELECTRO HAND CTR (MISCELLANEOUS) ×2 IMPLANT
SET TUBE SUCT SHAVER OUTFL 24K (TUBING) ×2 IMPLANT
STRIP CLOSURE SKIN 1/2X4 (GAUZE/BANDAGES/DRESSINGS) ×2 IMPLANT
SUCTION FRAZIER HANDLE 10FR (MISCELLANEOUS) ×1
SUCTION TUBE FRAZIER 10FR DISP (MISCELLANEOUS) IMPLANT
SUT ETHILON 3-0 FS-10 30 BLK (SUTURE) ×2
SUT ETHILON 4-0 (SUTURE) ×2
SUT ETHILON 4-0 FS2 18XMFL BLK (SUTURE) ×1
SUT FIBERSTICK 2-0 (SUTURE) ×2 IMPLANT
SUT MNCRL 4-0 (SUTURE) ×2
SUT MNCRL 4-0 27XMFL (SUTURE) ×1
SUT VIC AB 2-0 CT2 27 (SUTURE) ×1 IMPLANT
SUT VIC AB 2-0 SH 27 (SUTURE)
SUT VIC AB 2-0 SH 27XBRD (SUTURE) ×1 IMPLANT
SUTURE EHLN 3-0 FS-10 30 BLK (SUTURE) IMPLANT
SUTURE ETHLN 4-0 FS2 18XMF BLK (SUTURE) ×1 IMPLANT
SUTURE MNCRL 4-0 27XMF (SUTURE) ×1 IMPLANT
TOWEL OR 17X26 4PK STRL BLUE (TOWEL DISPOSABLE) ×4 IMPLANT
TUBING ARTHRO INFLOW-ONLY STRL (TUBING) ×2 IMPLANT
WAND HAND CNTRL MULTIVAC 50 (MISCELLANEOUS) IMPLANT
WAND HAND CNTRL MULTIVAC 90 (MISCELLANEOUS) ×1 IMPLANT
WRAPON POLAR PAD KNEE (MISCELLANEOUS) ×2

## 2017-07-20 NOTE — Anesthesia Preprocedure Evaluation (Signed)
Anesthesia Evaluation  Patient identified by MRN, date of birth, ID band Patient awake    Reviewed: Allergy & Precautions, H&P , NPO status , Patient's Chart, lab work & pertinent test results  Airway Mallampati: III  TM Distance: >3 FB Neck ROM: full    Dental no notable dental hx.    Pulmonary    Pulmonary exam normal breath sounds clear to auscultation       Cardiovascular hypertension, Normal cardiovascular exam Rhythm:regular Rate:Normal     Neuro/Psych    GI/Hepatic GERD  ,  Endo/Other  diabetesMorbid obesity  Renal/GU      Musculoskeletal   Abdominal   Peds  Hematology   Anesthesia Other Findings   Reproductive/Obstetrics                             Anesthesia Physical Anesthesia Plan  ASA: III  Anesthesia Plan: General   Post-op Pain Management: GA combined w/ Regional for post-op pain   Induction:   PONV Risk Score and Plan: 3 and Propofol infusion and Ondansetron  Airway Management Planned:   Additional Equipment:   Intra-op Plan:   Post-operative Plan:   Informed Consent: I have reviewed the patients History and Physical, chart, labs and discussed the procedure including the risks, benefits and alternatives for the proposed anesthesia with the patient or authorized representative who has indicated his/her understanding and acceptance.     Plan Discussed with: CRNA  Anesthesia Plan Comments:         Anesthesia Quick Evaluation

## 2017-07-20 NOTE — Discharge Instructions (Signed)
Arthroscopic Knee Surgery - Meniscus Repair  Post-Op Instructions  1. Bracing or crutches: Crutches will be provided at the time of discharge from the surgery center.   2. Ice: You may be provided with a device [facility](Polar [employer]) that allows you to ice the affected area effectively. Otherwise you can ice manually.   3. Driving:  Plan on not driving for at least four weeks. Please note that you are advised NOT to drive while taking narcotic pain medications as you may be impaired and unsafe to drive.  4. Activity: Ankle pumps several times an hour while awake to prevent blood clots. Weight bearing: NON-WEIGHT BEARING. Use crutches for 6 weeks. Bending and straightening the knee is unlimited, but do not flex your knee past 90 degrees until cleared by your therapist. Elevate knee above heart level as much as possible for one week. Avoid standing more than 5 minutes (consecutively) for the first week. No exercise involving the knee until cleared by the surgeon or physical therapist.  Avoid long distance travel for 4 weeks.  5. Medications:  - You have been provided a prescription for narcotic pain medicine. After surgery, take 1-2 narcotic tablets every 4 hours if needed for severe pain.  - A prescription for anti-nausea medication will be provided in case the narcotic medicine causes nausea - take 1 tablet every 6 hours only if nauseated.  - Take enteric coated aspirin 325 mg once daily for 4 weeks to prevent blood clots.  -Take tylenol 1000 every 8 hours for pain.  May stop tylenol 3 days after surgery or when you are having minimal pain.  If you are taking prescription medication for anxiety, depression, insomnia, muscle spasm, chronic pain, or for attention deficit disorder you are advised that you are at a higher risk of adverse effects with use of narcotics post-op, including narcotic addiction/dependence, depressed breathing, death. If you use non-prescribed substances: alcohol, marijuana,  cocaine, heroin, methamphetamines, etc., you are at a higher risk of adverse effects with use of narcotics post-op, including narcotic addiction/dependence, depressed breathing, death. You are advised that taking > 50 morphine milligram equivalents (MME) of narcotic pain medication per day results in twice the risk of overdose or death. For your prescription provided: oxycodone 5 mg - taking more than 6 tablets per day. Be advised that we will prescribe narcotics short-term, for acute post-operative pain only - 1 week for minor operations such as knee arthroscopy for meniscus tear resection, and 3 weeks for major operations such as knee repair/reconstruction surgeries.   6. Bandages: The physical therapist should change the bandages at the first post-op appointment. If needed, the dressing supplies have been provided to you.  7. Physical Therapy: 2 times per week for the first 4 weeks, then 1-2 times per week from weeks 4-8 post-op. Therapy typically starts on post operative Day 3 or 4. You have been provided an order for physical therapy. The therapist will provide home exercises.  8. Work: May return to full work when off of crutches. May do light duty/desk job in approximately 1-2 weeks when off of narcotics, pain is well-controlled, and swelling has decreased.  9. Post-Op Appointments: Your first post-op appointment will be with Dr. Allena KatzPatel in approximately 2 weeks time.   If you find that they have not been scheduled please call the Orthopaedic Appointment front desk at [phone].     General Anesthesia, Adult, Care After These instructions provide you with information about caring for yourself after your procedure. Your health care  provider may also give you more specific instructions. Your treatment has been planned according to current medical practices, but problems sometimes occur. Call your health care provider if you have any problems or questions after your procedure. What can I  expect after the procedure? After the procedure, it is common to have:  Vomiting.  A sore throat.  Mental slowness.  It is common to feel:  Nauseous.  Cold or shivery.  Sleepy.  Tired.  Sore or achy, even in parts of your body where you did not have surgery.  Follow these instructions at home: For at least 24 hours after the procedure:  Do not: ? Participate in activities where you could fall or become injured. ? Drive. ? Use heavy machinery. ? Drink alcohol. ? Take sleeping pills or medicines that cause drowsiness. ? Make important decisions or sign legal documents. ? Take care of children on your own.  Rest. Eating and drinking  If you vomit, drink water, juice, or soup when you can drink without vomiting.  Drink enough fluid to keep your urine clear or pale yellow.  Make sure you have little or no nausea before eating solid foods.  Follow the diet recommended by your health care provider. General instructions  Have a responsible adult stay with you until you are awake and alert.  Return to your normal activities as told by your health care provider. Ask your health care provider what activities are safe for you.  Take over-the-counter and prescription medicines only as told by your health care provider.  If you smoke, do not smoke without supervision.  Keep all follow-up visits as told by your health care provider. This is important. Contact a health care provider if:  You continue to have nausea or vomiting at home, and medicines are not helpful.  You cannot drink fluids or start eating again.  You cannot urinate after 8-12 hours.  You develop a skin rash.  You have fever.  You have increasing redness at the site of your procedure. Get help right away if:  You have difficulty breathing.  You have chest pain.  You have unexpected bleeding.  You feel that you are having a life-threatening or urgent problem. This information is not intended  to replace advice given to you by your health care provider. Make sure you discuss any questions you have with your health care provider. Document Released: 11/07/2000 Document Revised: 01/04/2016 Document Reviewed: 07/16/2015 Elsevier Interactive Patient Education  [employer]2018 [employer]

## 2017-07-20 NOTE — H&P (Signed)
Paper H&P to be scanned into permanent record. H&P reviewed. No significant changes noted.  

## 2017-07-20 NOTE — Op Note (Signed)
DATE: 07/20/2017  PRE-OP DIAGNOSIS:  1. Right medial meniscus root tear 2. Right lateral, medial, and patellofemoral compartment osteoarthritis  POST-OP DIAGNOSIS:  1. Right medial meniscus root tear 2. Right lateral, medial, and patellofemoral compartment osteoarthritis  PROCEDURES:  1. Right medial femoral condyle, lateral tibial plateau, and patella chondroplasty  2. Right knee medial meniscus repair   SURGEON:  Novella OliveSunny Harish Keionna Kinnaird, MD  ASSISTANT(S):  none  ANESTHESIA: regional adductor canal block + general  TOTAL IV FLUIDS: See anesthesia record  ESTIMATED BLOOD LOSS: Minimal  TOURNIQUET TIME:  108 min  DRAINS:  None.  IMPLANTS:  - Arthrex Suture Button (x1)   COMPLICATIONS: None  INDICATIONS: Charlene Parker is a 66 y.o. female with a medial meniscus root tear. Symptoms have been present for ~3 months and began without traumatic incident. MRI showed a medial meniscus root tear with mild degenerative changes to medial, lateral, and patellofemoral compartments. Given the poor long-term prognosis of a meniscus root tear and high likelihood of significant progression of osteoarthritis, we elected to proceed with the above procedure after a discussion of the risks, benefits, and alternatives to surgery.   DESCRIPTION OF PROCEDURE: The patient was seen in the Holding Room. The risks, benefits, complications, treatment options, and expected outcomes were discussed with the patient. The patient concurred with the proposed plan, giving informed consent.  The site of surgery was properly noted/marked. The patient received a peripheral nerve block prior to surgery.  The patient was taken to Operating Room. A Time Out was held and the above information confirmed. After administration of adequate anesthesia, the entire lower extremity was prescrubbed with Hibiclens, prepped with Chloroprep, and draped in sterile fashion. The patient was given pre-operative IV  antibiotics within 30 minutes of the skin incision.   Arthroscopy portals were marked and an 11 blade was used to make the anterolateral viewing portal.    First, a knee arthroscopy was performed.  The arthroscope was placed in the anterolateral portal and then into the suprapatellar pouch.  The pouch was unremarkable.  There was no plica.   Hoffa's fat pad was inflamed and impinging in the patellofemoral joint. The medial and lateral gutters were normal  Patella tracking was normal.The patella articular cartilage had Grade 2 degenerative changes. The trochlea articular cartilage was normal.  An oscillating shaver was used to dbride the areas of pathology to leave stable borders.  Medial compartment: Articular cartilage of the medial femoral condyle was notable for Grade 1 degenerative changes diffusely. Articular cartilage of the medial tibial plateau had Grade 1 degenerative changes.The medial meniscus root had a complete radial tear at the meniscus root.   Lateral compartment: Articular cartilage of the lateral femoral condyle was normal and tibial plateau was notable for areas of Grade 2 degenerative changes. The lateral meniscus in its entirety was normal.   We first pie-crusted the MCL to improve our visualization of the posterior horn and root of the medial meniscus. The meniscus root tear was identified and probed to confirm our findings. Next, an Arthrex meniscus root aiming guide was used to mark out the tibial incision. An approximately 5cm vertical incision was made medial to the tibial tubercle. This was carried down to the sartorius fascia with bovie electrocautery, and the fascia was incised. An elevator was used to clear periosteum from the tibia in the area of the anticipated bone tunnel. Hemostasis was achieved. The guide was reinserted into the tibia was placed over the anatomic footprint of the medial meniscus  root by hooking the posterior tibial cortex and setting the guide  to 7.705mm off the posterior cortex. We then used a 6.200mm FlipCutter to drill into the tibia and create a 7mm socket for the meniscus root. A FiberStick was passed through our tibial tunnel and into the joint. This was retrieved and passed through the anterolateral portal.   Next, we passed a Ceterix Novostitch 2-0 stitch just medial to the root of the medial meniscus in a mattress fashion. A luggage tag stitch was created and passed into the joint, which showed excellent purchase of the meniscus root. This stitch and configuration was repeated ~6710mm medial to the first stitch. We ensured there was no soft tissue bridge between the sutures and pulled the passing stitch from the FiberStick through the anteromedial portal. We then used the passing stitch to bring the sutures in the meniscus out through the tibial tunnel. These were then tied over an Arthrex suture button. We then looked into the joint and confirmed our reduction.  The knee was then copiously irrigated and excess fluid was expressed from the joint. Closure of the portals with 3-0 nylon was performed. The sartorius fascia was closed with 2-0 vicryl. The subdermal layer of the tibial incision was closed with 2-0 vicryl and the skin was closed with 4-0 Monocryl in a running fashion. Xeroform gauze and dry sterile dressings were applied. A PolarCare and hinged knee brace was applied.   Instrument, sponge, and needle counts were correct prior to wound closure and at the conclusion of the case.   DISPOSITION: PACU - hemodynamically stable.   POSTOPERATIVE PLAN: The patient will be discharged home today.    Non-weight bearing x6 weeks. Narcotic medication and acetaminophen as discussed pre-operatively. ASA 325mg /day x 4 weeks for DVT ppx. The patient will be attending physical therapy twice weekly beginning 3-4 days post-op. Physical therapy per Meniscus Repair (meniscus root repair) Protocol.   Patient to return to clinic 10-14 days  postop for suture removal.

## 2017-07-20 NOTE — Transfer of Care (Signed)
Immediate Anesthesia Transfer of Care Note  Patient: Charlene Parker  Procedure(s) Performed: KNEE ARTHROSCOPY WITH MENISCAL REPAIR (Left ) CHONDROPLASTY (Left )  Patient Location: PACU  Anesthesia Type: General  Level of Consciousness: awake, alert  and patient cooperative  Airway and Oxygen Therapy: Patient Spontanous Breathing and Patient connected to supplemental oxygen  Post-op Assessment: Post-op Vital signs reviewed, Patient's Cardiovascular Status Stable, Respiratory Function Stable, Patent Airway and No signs of Nausea or vomiting  Post-op Vital Signs: Reviewed and stable  Complications: No apparent anesthesia complications

## 2017-07-20 NOTE — Anesthesia Procedure Notes (Signed)
Anesthesia Regional Block: Adductor canal block   Pre-Anesthetic Checklist: ,, timeout performed, Correct Patient, Correct Site, Correct Laterality, Correct Procedure, Correct Position, site marked, Risks and benefits discussed,  Surgical consent,  Pre-op evaluation,  At surgeon's request and post-op pain management  Laterality: Left  Prep: chloraprep       Needles:  Injection technique: Single-shot  Needle Type: Echogenic Needle     Needle Length: 9cm  Needle Gauge: 21     Additional Needles:   Procedures:,,,, ultrasound used (permanent image in chart),,,,  Narrative:  Start time: 07/20/2017 11:46 AM End time: 07/20/2017 11:51 AM Injection made incrementally with aspirations every 5 mL.  Performed by: Personally  Anesthesiologist: Ranee GosselinStella, Koren Plyler, MD  Additional Notes: Functioning IV was confirmed and monitors applied. Ultrasound guidance: relevant anatomy identified, needle position confirmed, local anesthetic spread visualized around nerve(s)., vascular puncture avoided.  Image printed for medical record.  Negative aspiration and no paresthesias; incremental administration of local anesthetic. The patient tolerated the procedure well. Vitals signes recorded in RN notes. 15ml.

## 2017-07-20 NOTE — Anesthesia Procedure Notes (Signed)
Procedure Name: LMA Insertion Date/Time: 07/20/2017 1:09 PM Performed by: Jimmy PicketAmyot, Alanta Scobey, CRNA Pre-anesthesia Checklist: Patient identified, Emergency Drugs available, Suction available, Timeout performed and Patient being monitored Patient Re-evaluated:Patient Re-evaluated prior to induction Oxygen Delivery Method: Circle system utilized Preoxygenation: Pre-oxygenation with 100% oxygen Induction Type: IV induction LMA: LMA inserted LMA Size: 4.0 Number of attempts: 1 Placement Confirmation: positive ETCO2 and breath sounds checked- equal and bilateral Tube secured with: Tape

## 2017-07-21 NOTE — Anesthesia Postprocedure Evaluation (Signed)
Anesthesia Post Note  Patient: Charlene Parker  Procedure(s) Performed: KNEE ARTHROSCOPY WITH MENISCAL REPAIR (Left Knee) CHONDROPLASTY (Left Knee)  Patient location during evaluation: PACU Anesthesia Type: General Level of consciousness: awake and alert and oriented Pain management: satisfactory to patient Vital Signs Assessment: post-procedure vital signs reviewed and stable Respiratory status: spontaneous breathing, nonlabored ventilation and respiratory function stable Cardiovascular status: blood pressure returned to baseline and stable Postop Assessment: Adequate PO intake and No signs of nausea or vomiting Anesthetic complications: no    Cherly BeachStella, Jerred Zaremba J

## 2017-07-22 ENCOUNTER — Encounter: Payer: Self-pay | Admitting: Orthopedic Surgery

## 2017-07-26 ENCOUNTER — Ambulatory Visit
Admission: RE | Admit: 2017-07-26 | Discharge: 2017-07-26 | Disposition: A | Discharge status: Home / Self Care | Payer: 59 | Source: Ambulatory Visit | Attending: Internal Medicine | Admitting: Internal Medicine

## 2017-07-26 ENCOUNTER — Other Ambulatory Visit: Payer: Self-pay | Admitting: Internal Medicine

## 2017-07-26 DIAGNOSIS — M7989 Other specified soft tissue disorders: Secondary | ICD-10-CM | POA: Diagnosis not present

## 2017-07-26 DIAGNOSIS — I824Y9 Acute embolism and thrombosis of unspecified deep veins of unspecified proximal lower extremity: Secondary | ICD-10-CM | POA: Insufficient documentation

## 2018-07-05 ENCOUNTER — Other Ambulatory Visit: Payer: Self-pay | Admitting: Orthopedic Surgery

## 2018-07-05 DIAGNOSIS — M79601 Pain in right arm: Secondary | ICD-10-CM

## 2018-09-15 ENCOUNTER — Other Ambulatory Visit: Payer: Self-pay

## 2018-09-15 ENCOUNTER — Ambulatory Visit
Admission: EM | Admit: 2018-09-15 | Discharge: 2018-09-15 | Disposition: A | Discharge status: Home / Self Care | Payer: Managed Care, Other (non HMO) | Attending: Family Medicine | Admitting: Family Medicine

## 2018-09-15 ENCOUNTER — Encounter: Payer: Self-pay | Admitting: Emergency Medicine

## 2018-09-15 DIAGNOSIS — B9789 Other viral agents as the cause of diseases classified elsewhere: Secondary | ICD-10-CM | POA: Diagnosis not present

## 2018-09-15 DIAGNOSIS — J069 Acute upper respiratory infection, unspecified: Secondary | ICD-10-CM

## 2018-09-15 MED ORDER — IPRATROPIUM BROMIDE 0.06 % NA SOLN: 2.0000 | Freq: Four times a day (QID) | NASAL | 0 refills | Status: DC | PRN

## 2018-09-15 MED ORDER — BENZONATATE 100 MG PO CAPS: 100.0000 mg | ORAL_CAPSULE | Freq: Three times a day (TID) | ORAL | 0 refills | Status: DC | PRN

## 2018-09-15 NOTE — ED Provider Notes (Signed)
[facility]    CSN: [id number] Arrival date & time: 09/15/18  1027  History   Chief Complaint Chief Complaint  Patient presents with  . Cough  . Generalized Body Aches   HPI   68 year old female presents with respiratory symptoms.  Patient reports that her symptoms started on Tuesday.  She reports cough, runny nose.  She states her cough is currently mild.  She states that she is clearing her throat often.  She states that she feels "achy" around the eyes.  No fever.  No chills.  No known exacerbating or relieving factors.  Patient concerned about "congestion in my chest".  No other associated symptoms.  No other complaints or concerns at this time.  PMH, Surgical Hx, Family Hx, Social History reviewed and updated as below.  Past Medical History:  Diagnosis Date  . Arthritis    knee  . Diabetes mellitus without complication (HCC)   . GERD (gastroesophageal reflux disease)   . Hypertension    Past Surgical History:  Procedure Laterality Date  . CARPAL TUNNEL RELEASE Right   . CESAREAN SECTION    . CHOLECYSTECTOMY    . CHONDROPLASTY Left 07/20/2017   Procedure: CHONDROPLASTY;  Surgeon: Signa KellPatel, Sunny, MD;  [facility];  Service: Orthopedics;  Laterality: Left;  Diabetic - insulin and oral meds  . GALLBLADDER SURGERY    . KNEE ARTHROSCOPY WITH MENISCAL REPAIR Left 07/20/2017   Procedure: KNEE ARTHROSCOPY WITH MENISCAL REPAIR;  Surgeon: Signa KellPatel, Sunny, MD;  [facility];  Service: Orthopedics;  Laterality: Left;  ADDUCTOR CANAL NERVE BLOCK  . KNEE SURGERY Right     OB History    Gravida  2   Para  2   Term  2   Preterm      AB      Living  2     SAB      TAB      Ectopic      Multiple      Live Births               Home Medications    Prior to Admission medications   Medication Sig Start Date End Date Taking? Authorizing Provider  insulin lispro (HUMALOG) 100 UNIT/ML injection Inject into the skin 3  (three) times daily before meals.   Yes [provider]  LANTUS SOLOSTAR 100 UNIT/ML Solostar Pen Inject 40 Units into the skin at bedtime.  02/13/15  Yes [provider]  metFORMIN (GLUCOPHAGE) 500 MG tablet Take by mouth 2 (two) times daily with a meal.   Yes [provider]  metoprolol tartrate (LOPRESSOR) 25 MG tablet Take 25 mg by mouth 2 (two) times daily.   Yes [provider]  benzonatate (TESSALON) 100 MG capsule Take 1 capsule (100 mg total) by mouth 3 (three) times daily as needed. 09/15/18   Tommie Samsook, Genea Rheaume G, DO  insulin lispro (HUMALOG) 100 UNIT/ML injection Inject into the skin.    [provider]  ipratropium (ATROVENT) 0.06 % nasal spray Place 2 sprays into both nostrils 4 (four) times daily as needed for rhinitis. 09/15/18   Tommie Samsook, Masin Shatto G, DO  meloxicam (MOBIC) 15 MG tablet Take 1 tablet (15 mg total) by mouth daily. 05/06/15   Hyatt, Max T, DPM  ondansetron (ZOFRAN ODT) 4 MG disintegrating tablet Take 1 tablet (4 mg total) by mouth every 8 (eight) hours as needed for nausea or vomiting. 07/20/17   Signa KellPatel, Sunny, MD  [facility]  ULTRA TEST test strip  02/16/15   [provider]  Dola ArgyleNETOUCH DELICA LANCETS 33G MISC  02/26/15   [provider]    Family History Family History  Problem Relation Age of Onset  . Breast cancer Maternal Aunt        4150s  . Diabetes Maternal Aunt   . Heart Problems Maternal Aunt   . Diabetes Mother   . Breast cancer Maternal Aunt 93  . Breast cancer Cousin     Social History Social History   Tobacco Use  . Smoking status: Never Smoker  . Smokeless tobacco: Never Used  Substance Use Topics  . Alcohol use: No    Alcohol/week: 0.0 standard drinks  . Drug use: No     Allergies   Prednisone   Review of Systems Review of Systems  Constitutional: Negative for fever.  HENT: Positive for rhinorrhea.        "achy" around the eyes.  Respiratory: Positive for cough.    Physical Exam Triage Vital  Signs ED Triage Vitals  Enc Vitals Group     BP 09/15/18 1043 (!) 155/85     Pulse Rate 09/15/18 1043 85     Resp 09/15/18 1043 16     Temp 09/15/18 1043 98.2 F (36.8 C)     Temp Source 09/15/18 1043 Oral     SpO2 09/15/18 1043 97 %     Weight 09/15/18 1040 219 lb (99.3 kg)     Height 09/15/18 1040 5\' 3"  (1.6 m)     Head Circumference --      Peak Flow --      Pain Score 09/15/18 1039 0     Pain Loc --      Pain Edu? --      Excl. in GC? --    Updated Vital Signs BP (!) 155/85 (BP Location: Left Arm)   Pulse 85   Temp 98.2 F (36.8 C) (Oral)   Resp 16   Ht 5\' 3"  (1.6 m)   Wt 99.3 kg   SpO2 97%   BMI 38.79 kg/m   Visual Acuity Right Eye Distance:   Left Eye Distance:   Bilateral Distance:    Right Eye Near:   Left Eye Near:    Bilateral Near:     Physical Exam Vitals signs and nursing note reviewed.  Constitutional:      General: She is not in acute distress.    Appearance: Normal appearance.  HENT:     Head: Normocephalic and atraumatic.     Nose: Nose normal.     Mouth/Throat:     Pharynx: Oropharynx is clear. No posterior oropharyngeal erythema.  Eyes:     General:        Right eye: No discharge.        Left eye: No discharge.     Conjunctiva/sclera: Conjunctivae normal.  Neck:     Musculoskeletal: Neck supple.  Cardiovascular:     Rate and Rhythm: Normal rate and regular rhythm.  Pulmonary:     Effort: Pulmonary effort is normal.     Breath sounds: Normal breath sounds.  Lymphadenopathy:     Cervical: No cervical adenopathy.  Neurological:     Mental Status: She is alert.  Psychiatric:        Mood and Affect: Mood normal.        Behavior: Behavior normal.     UC Treatments / Results  Labs (all labs ordered are listed, but only abnormal  results are displayed) Labs Reviewed - No data to display  EKG None  Radiology No results found.  Procedures Procedures (including critical care time)  Medications Ordered in UC Medications - No  data to display  Initial Impression / Assessment and Plan / UC Course  I have reviewed the triage vital signs and the nursing notes.  Pertinent labs & imaging results that were available during my care of the patient were reviewed by me and considered in my medical decision making (see chart for details).    68 year old female presents with viral URI with cough.  Treating with Tessalon Perles and Atrovent nasal spray.  Supportive care.  Final Clinical Impressions(s) / UC Diagnoses   Final diagnoses:  Viral URI with cough     Discharge Instructions     This is viral.  Rest.  Fluids.  Medications as prescribed.  Take care  Dr. Adriana Simas    ED Prescriptions    Medication Sig Dispense Auth. Provider   ipratropium (ATROVENT) 0.06 % nasal spray Place 2 sprays into both nostrils 4 (four) times daily as needed for rhinitis. 15 mL Rylea Selway G, DO   benzonatate (TESSALON) 100 MG capsule Take 1 capsule (100 mg total) by mouth 3 (three) times daily as needed. 30 capsule Tommie Sams, DO     Controlled Substance Prescriptions [place] Controlled Substance Registry consulted? Not Applicable   Tommie Sams, DO 09/15/18 1156

## 2018-09-15 NOTE — Discharge Instructions (Addendum)
This is viral.  Rest.  Fluids.  Medications as prescribed.  Take care  Dr. Adriana Simas

## 2018-09-15 NOTE — ED Triage Notes (Signed)
Patient c/o cough, congestion, and bodyaches that started Tuesday night.

## 2018-12-12 ENCOUNTER — Other Ambulatory Visit: Payer: Self-pay | Admitting: Internal Medicine

## 2019-01-16 DIAGNOSIS — I152 Hypertension secondary to endocrine disorders: Secondary | ICD-10-CM

## 2019-01-16 DIAGNOSIS — E1169 Type 2 diabetes mellitus with other specified complication: Secondary | ICD-10-CM | POA: Insufficient documentation

## 2019-01-17 ENCOUNTER — Other Ambulatory Visit: Payer: Self-pay | Admitting: Internal Medicine

## 2019-01-17 DIAGNOSIS — Z1231 Encounter for screening mammogram for malignant neoplasm of breast: Secondary | ICD-10-CM

## 2019-01-28 ENCOUNTER — Encounter: Payer: Self-pay | Admitting: Obstetrics and Gynecology

## 2019-01-28 ENCOUNTER — Other Ambulatory Visit: Payer: Self-pay

## 2019-01-28 ENCOUNTER — Ambulatory Visit (INDEPENDENT_AMBULATORY_CARE_PROVIDER_SITE_OTHER): Payer: Managed Care, Other (non HMO) | Admitting: Obstetrics and Gynecology

## 2019-01-28 VITALS — BP 130/70 | HR 88 | Ht 63.0 in | Wt 213.0 lb

## 2019-01-28 DIAGNOSIS — Z124 Encounter for screening for malignant neoplasm of cervix: Secondary | ICD-10-CM

## 2019-01-28 DIAGNOSIS — Z01419 Encounter for gynecological examination (general) (routine) without abnormal findings: Secondary | ICD-10-CM | POA: Diagnosis not present

## 2019-01-28 NOTE — Progress Notes (Signed)
Gynecology Annual Exam  PCP: Albina Billet, MD  Chief Complaint:  Chief Complaint  Patient presents with  . Gynecologic Exam    possible yeast infection, white discharge    History of Present Illness:Patient is a 68 y.o. G2P2002 presents for annual exam. The patient has no complaints today.   LMP: No LMP recorded. Patient is postmenopausal. Menarche:not applicable  Menopause: In 50s  Intermenstrual Bleeding: no Postcoital Bleeding: no Dysmenorrhea: not applicable  The patient is not currently sexually active. She denies dyspareunia.  The patient does perform self breast exams.  There is notable family history of breast or ovarian cancer in her family.  The patient wears seatbelts: yes.   The patient has regular exercise: not asked.    The patient denies current symptoms of depression.     Review of Systems: ROS  Past Medical History:  Past Medical History:  Diagnosis Date  . Arthritis    knee  . Diabetes mellitus without complication ([place])   . GERD (gastroesophageal reflux disease)   . Hypertension     Past Surgical History:  Past Surgical History:  Procedure Laterality Date  . CARPAL TUNNEL RELEASE Right   . CESAREAN SECTION    . CHOLECYSTECTOMY    . CHONDROPLASTY Left 07/20/2017   Procedure: CHONDROPLASTY;  Surgeon: Leim Fabry, MD;  Location: [facility];  Service: Orthopedics;  Laterality: Left;  Diabetic - insulin and oral meds  . GALLBLADDER SURGERY    . KNEE ARTHROSCOPY WITH MENISCAL REPAIR Left 07/20/2017   Procedure: KNEE ARTHROSCOPY WITH MENISCAL REPAIR;  Surgeon: Leim Fabry, MD;  Location: [facility];  Service: Orthopedics;  Laterality: Left;  ADDUCTOR CANAL NERVE BLOCK  . KNEE SURGERY Right     Gynecologic History:  No LMP recorded. Patient is postmenopausal. Last Pap: Results were: unknown    Last mammogram: 2018 Results were: BI-RAD I  Obstetric History: [id number]  Family History:  Family History  Problem Relation Age  of Onset  . Breast cancer Maternal Aunt        53s  . Diabetes Maternal Aunt   . Heart Problems Maternal Aunt   . Diabetes Mother   . Breast cancer Maternal Aunt 93  . Breast cancer Cousin   . Diabetes Sister   . Diabetes Brother     Social History:  Social History   Socioeconomic History  . Marital status: Married    Spouse name: Not on file  . Number of children: Not on file  . Years of education: Not on file  . Highest education level: Not on file  Occupational History  . Not on file  Social Needs  . Financial resource strain: Not on file  . Food insecurity    Worry: Not on file    Inability: Not on file  . Transportation needs    Medical: Not on file    Non-medical: Not on file  Tobacco Use  . Smoking status: Never Smoker  . Smokeless tobacco: Never Used  Substance and Sexual Activity  . Alcohol use: No    Alcohol/week: 0.0 standard drinks  . Drug use: No  . Sexual activity: Not Currently    Birth control/protection: Post-menopausal  Lifestyle  . Physical activity    Days per week: Not on file    Minutes per session: Not on file  . Stress: Not on file  Relationships  . Social Herbalist on phone: Not on file    Gets together:  Not on file    Attends religious service: Not on file    Active member of club or organization: Not on file    Attends meetings of clubs or organizations: Not on file    Relationship status: Not on file  . Intimate partner violence    Fear of current or ex partner: Not on file    Emotionally abused: Not on file    Physically abused: Not on file    Forced sexual activity: Not on file  Other Topics Concern  . Not on file  Social History Narrative  . Not on file    Allergies:  Allergies  Allergen Reactions  . Prednisone     Made glucose levels go EXTREMELY high    Medications: Prior to Admission medications   Medication Sig Start Date End Date Taking? Authorizing Provider  acetaminophen (TYLENOL) 500 MG tablet  Take by mouth.   Yes [provider]  benzonatate (TESSALON) 100 MG capsule Take 1 capsule (100 mg total) by mouth 3 (three) times daily as needed. 09/15/18  Yes Cook, Jayce G, DO  calcium carbonate (TUMS - DOSED IN MG ELEMENTAL CALCIUM) 500 MG chewable tablet Chew 1 tablet by mouth daily.   Yes [provider]  empagliflozin (JARDIANCE) 10 MG TABS tablet Take 10 mg by mouth daily.   Yes [provider]  ibuprofen (ADVIL) 600 MG tablet Take by mouth. 06/10/17  Yes [provider]  insulin lispro (HUMALOG) 100 UNIT/ML injection Inject into the skin 3 (three) times daily before meals.   Yes [provider]  LANTUS SOLOSTAR 100 UNIT/ML Solostar Pen Inject 40 Units into the skin at bedtime.  02/13/15  Yes [provider]  metFORMIN (GLUCOPHAGE) 500 MG tablet Take by mouth 2 (two) times daily with a meal.   Yes [provider]  metoprolol tartrate (LOPRESSOR) 25 MG tablet Take 25 mg by mouth 2 (two) times daily.   Yes [provider]  ONE TOUCH ULTRA TEST test strip  02/16/15  Yes [provider]  Dola ArgyleNETOUCH DELICA LANCETS 33G MISC  02/26/15  Yes [provider]  TRULICITY 0.75 MG/0.5ML SOPN INJECT .75MG  SUBCUTANEOUSLY ONCE EACH WEEK 01/18/19  Yes [provider]    Physical Exam Vitals: Blood pressure 130/70, pulse 88, height 5\' 3"  (1.6 m), weight 213 lb (96.6 kg).  General: NAD HEENT: normocephalic, anicteric Thyroid: no enlargement, no palpable nodules Pulmonary: No increased work of breathing, CTAB Cardiovascular: RRR, distal pulses 2+ Breast: Breast symmetrical, no tenderness, no palpable nodules or masses, no skin or nipple retraction present, no nipple discharge.  No axillary or supraclavicular lymphadenopathy. Abdomen: NABS, soft, non-tender, non-distended.  Umbilicus without lesions.  No hepatomegaly, splenomegaly or masses palpable. No evidence of hernia  Genitourinary:  External: Normal external  female genitalia.  Normal urethral meatus, normal Bartholin's and Skene's glands.    Vagina: Normal vaginal mucosa, no evidence of prolapse.    Cervix: Grossly normal in appearance, no bleeding  Uterus: Non-enlarged, mobile, normal contour.  No CMT  Adnexa: ovaries non-enlarged, no adnexal masses  Rectal: deferred  Lymphatic: no evidence of inguinal lymphadenopathy Extremities: no edema, erythema, or tenderness Neurologic: Grossly intact Psychiatric: mood appropriate, affect full  Female chaperone present for pelvic and breast  portions of the physical exam     Assessment: 68 y.o. G2P2002 routine annual exam  Plan: Problem List Items Addressed This Visit    None      1) Mammogram - recommend yearly screening mammogram.  Mammogram scheduled for july 2020  2) STI screening  was notoffered and therefore not obtained  3) ASCCP guidelines and rational discussed.  Patient opts for every 5 years screening interval  4) Osteoporosis  - per USPTF routine screening DEXA at age 68 - FRAX 10 year major fracture risk 3.2,  10 year hip fracture risk 0.4 Believes she had a DEXA scan 7 years ago. Unsure of results.  Consider FDA-approved medical therapies in postmenopausal women and men aged 67 years and older, based on the following: a) A hip or vertebral (clinical or morphometric) fracture b) T-score ? -2.5 at the femoral neck or spine after appropriate evaluation to exclude secondary causes C) Low bone mass (T-score between -1.0 and -2.5 at the femoral neck or spine) and a 10-year probability of a hip fracture ? 3% or a 10-year probability of a major osteoporosis-related fracture ? 20% based on the [place]-adapted WHO algorithm   5) Routine healthcare maintenance including cholesterol, diabetes screening discussed managed by PCP  6) Colonoscopy performed 5 years ago per patient.  Screening recommended starting at age 67 for average risk individuals, age 67 for individuals deemed at increased  risk (including African Americans) and recommended to continue until age 67.  For patient age 67-85 individualized approach is recommended.  Gold standard screening is via colonoscopy, Cologuard screening is an acceptable alternative for patient unwilling or unable to undergo colonoscopy.  "Colorectal cancer screening for average?risk adults: 2018 guideline update from the American Cancer Society"CA: A Cancer Journal for Clinicians: Jan 11, 2017   7) Return in about 1 year (around 01/28/2020) for annual.   Adelene Idlerhristanna Schuman MD [facility], [facility] 01/28/2019 10:14 AM

## 2019-02-02 LAB — IGP, APTIMA HPV: HPV Aptima: NEGATIVE

## 2019-02-26 ENCOUNTER — Other Ambulatory Visit: Payer: Self-pay

## 2019-02-26 ENCOUNTER — Ambulatory Visit
Admission: RE | Admit: 2019-02-26 | Discharge: 2019-02-26 | Disposition: A | Discharge status: Home / Self Care | Payer: Managed Care, Other (non HMO) | Source: Ambulatory Visit | Attending: Internal Medicine | Admitting: Internal Medicine

## 2019-02-26 DIAGNOSIS — Z1231 Encounter for screening mammogram for malignant neoplasm of breast: Secondary | ICD-10-CM | POA: Insufficient documentation

## 2019-09-10 ENCOUNTER — Ambulatory Visit: Payer: Managed Care, Other (non HMO) | Attending: Internal Medicine

## 2019-09-10 DIAGNOSIS — Z20822 Contact with and (suspected) exposure to covid-19: Secondary | ICD-10-CM

## 2019-09-11 LAB — NOVEL CORONAVIRUS, NAA: SARS-CoV-2, NAA: NOT DETECTED

## 2019-09-12 ENCOUNTER — Telehealth: Payer: Self-pay | Admitting: General Practice

## 2019-09-12 NOTE — Telephone Encounter (Signed)
Gave patient negative covid results. Patient understood.  °

## 2020-04-10 DIAGNOSIS — U071 COVID-19: Secondary | ICD-10-CM

## 2020-04-10 HISTORY — DX: COVID-19: U07.1

## 2020-04-14 ENCOUNTER — Encounter: Payer: Self-pay | Admitting: Ophthalmology

## 2020-04-17 ENCOUNTER — Emergency Department
Admission: EM | Admit: 2020-04-17 | Discharge: 2020-04-17 | Disposition: A | Discharge status: Home / Self Care | Payer: Managed Care, Other (non HMO) | Attending: Emergency Medicine | Admitting: Emergency Medicine

## 2020-04-17 ENCOUNTER — Encounter: Payer: Self-pay | Admitting: Emergency Medicine

## 2020-04-17 ENCOUNTER — Emergency Department: Payer: Managed Care, Other (non HMO)

## 2020-04-17 ENCOUNTER — Other Ambulatory Visit: Payer: Self-pay

## 2020-04-17 ENCOUNTER — Other Ambulatory Visit: Admission: RE | Admit: 2020-04-17 | Payer: Managed Care, Other (non HMO) | Source: Ambulatory Visit

## 2020-04-17 DIAGNOSIS — E872 Acidosis, unspecified: Secondary | ICD-10-CM

## 2020-04-17 DIAGNOSIS — R000 Tachycardia, unspecified: Secondary | ICD-10-CM | POA: Insufficient documentation

## 2020-04-17 DIAGNOSIS — R509 Fever, unspecified: Secondary | ICD-10-CM | POA: Diagnosis present

## 2020-04-17 DIAGNOSIS — I10 Essential (primary) hypertension: Secondary | ICD-10-CM | POA: Insufficient documentation

## 2020-04-17 DIAGNOSIS — E1165 Type 2 diabetes mellitus with hyperglycemia: Secondary | ICD-10-CM | POA: Diagnosis not present

## 2020-04-17 DIAGNOSIS — Z79899 Other long term (current) drug therapy: Secondary | ICD-10-CM | POA: Diagnosis not present

## 2020-04-17 DIAGNOSIS — U071 COVID-19: Secondary | ICD-10-CM | POA: Insufficient documentation

## 2020-04-17 DIAGNOSIS — Z794 Long term (current) use of insulin: Secondary | ICD-10-CM | POA: Insufficient documentation

## 2020-04-17 DIAGNOSIS — R5081 Fever presenting with conditions classified elsewhere: Secondary | ICD-10-CM

## 2020-04-17 DIAGNOSIS — R739 Hyperglycemia, unspecified: Secondary | ICD-10-CM

## 2020-04-17 LAB — COMPREHENSIVE METABOLIC PANEL
ALT: 37 U/L (ref 0–44)
AST: 48 U/L — ABNORMAL HIGH (ref 15–41)
Albumin: 3.5 g/dL (ref 3.5–5.0)
Alkaline Phosphatase: 46 U/L (ref 38–126)
Anion gap: 14 (ref 5–15)
BUN: 20 mg/dL (ref 8–23)
CO2: 22 mmol/L (ref 22–32)
Calcium: 9 mg/dL (ref 8.9–10.3)
Chloride: 103 mmol/L (ref 98–111)
Creatinine, Ser: 0.97 mg/dL (ref 0.44–1.00)
GFR calc Af Amer: >60 mL/min (ref >60)
GFR calc non Af Amer: >60 mL/min (ref >60)
Glucose, Bld: 244 mg/dL — ABNORMAL HIGH (ref 70–99)
Potassium: 3.5 mmol/L (ref 3.5–5.1)
Sodium: 139 mmol/L (ref 135–145)
Total Bilirubin: 0.8 mg/dL (ref 0.3–1.2)
Total Protein: 8 g/dL (ref 6.5–8.1)

## 2020-04-17 LAB — CBC WITH DIFFERENTIAL/PLATELET
Abs Immature Granulocytes: 0.03 10*3/uL (ref 0.00–0.07)
Basophils Absolute: 0 10*3/uL (ref 0.0–0.1)
Basophils Relative: 0 %
Eosinophils Absolute: 0 10*3/uL (ref 0.0–0.5)
Eosinophils Relative: 0 %
HCT: 46.2 % — ABNORMAL HIGH (ref 36.0–46.0)
Hemoglobin: 15.5 g/dL — ABNORMAL HIGH (ref 12.0–15.0)
Immature Granulocytes: 0 %
Lymphocytes Relative: 6 %
Lymphs Abs: 0.4 10*3/uL — ABNORMAL LOW (ref 0.7–4.0)
MCH: 27.4 pg (ref 26.0–34.0)
MCHC: 33.5 g/dL (ref 30.0–36.0)
MCV: 81.6 fL (ref 80.0–100.0)
Monocytes Absolute: 0.2 10*3/uL (ref 0.1–1.0)
Monocytes Relative: 3 %
Neutro Abs: 6.5 10*3/uL (ref 1.7–7.7)
Neutrophils Relative %: 91 %
Platelets: 183 10*3/uL (ref 150–400)
RBC: 5.66 MIL/uL — ABNORMAL HIGH (ref 3.87–5.11)
RDW: 12.8 % (ref 11.5–15.5)
WBC: 7.2 10*3/uL (ref 4.0–10.5)
nRBC: 0 % (ref 0.0–0.2)

## 2020-04-17 LAB — LACTIC ACID, PLASMA
Lactic Acid, Venous: 2.6 mmol/L (ref 0.5–1.9)
Lactic Acid, Venous: 2.3 mmol/L (ref 0.5–1.9)

## 2020-04-17 LAB — TROPONIN I (HIGH SENSITIVITY)
Troponin I (High Sensitivity): 8 ng/L (ref <18)
Troponin I (High Sensitivity): 8 ng/L (ref <18)

## 2020-04-17 LAB — BRAIN NATRIURETIC PEPTIDE: B Natriuretic Peptide: 15.5 pg/mL (ref 0.0–100.0)

## 2020-04-17 MED ORDER — ACETAMINOPHEN 325 MG PO TABS
650.0000 mg | ORAL_TABLET | Freq: Once | ORAL | Status: AC
  Administered 2020-04-17: 650 mg via ORAL
  Filled 2020-04-17: qty 2

## 2020-04-17 MED ORDER — KETOROLAC TROMETHAMINE 30 MG/ML IJ SOLN
15.0000 mg | Freq: Once | INTRAMUSCULAR | Status: AC
  Administered 2020-04-17: 15 mg via INTRAVENOUS
  Filled 2020-04-17: qty 1

## 2020-04-17 MED ORDER — LACTATED RINGERS IV BOLUS
1000.0000 mL | Freq: Once | INTRAVENOUS | Status: AC
  Administered 2020-04-17: 1000 mL via INTRAVENOUS

## 2020-04-17 NOTE — Discharge Instructions (Addendum)
Use Tylenol for pain and fevers.  Up to 1000 mg per dose, up to 4 times per day.  Do not take more than 4000 mg of Tylenol/acetaminophen within 24 hours.  Continue all of your other normal medications at home.  Please push fluids as much as possible to help maintain your hydration.  Follow-up with your PCP to discuss her continued symptoms. If you develop any worsening symptoms, please return to the ED.

## 2020-04-17 NOTE — ED Notes (Signed)
ED Provider at bedside. 

## 2020-04-17 NOTE — ED Provider Notes (Signed)
[facility] Emergency Department Provider Note ____________________________________________   First MD Initiated Contact with Patient 04/17/20 1800     (approximate)  I have reviewed the triage vital signs and the nursing notes.  HISTORY  Chief Complaint Shortness of Breath, Cough, and Fever   HPI Charlene Parker is a 69 y.o. femalewho presents to the ED for evaluation of shortness of breath and fever.  Chart review indicates history of HTN, DM on insulin and oral agents.  Patient presents to the ED with her husband for evaluation of worsening shortness of breath and fever at home.  Patient reports getting her first dose of the Moderna COVID-19 vaccine on Monday, 8/23, and then developing symptoms consistent with COVID-19 a few days later on 8/27, testing positive for COVID-19 the next day on 8/28.  She has been managed at home with her husband.   Husband reports stepping out of the house today to pick up one of his own prescription medications, and returning home to ambulances at their house.  Patient reports calling her cousin, and telling her cousin about worsening shortness of breath, and her cousin called 911.  Due to the severity of her dyspnea, patient elects to be transferred to the ED for evaluation.  Here in the ED, patient is reporting shortness of breath, generalized weakness and epigastric/substernal chest pain with her nonproductive coughing.  She is reporting no chest pain at rest, but developing 5/10 aching chest pain in the setting of her coughing, and this pain resolves after she stops coughing.  Patient denies productive cough, syncope, chest pain without coughing, trauma, vomiting.  Has not taken any medications at home to help with her fevers or symptoms.  Does not wear home oxygen.   Past Medical History:  Diagnosis Date  . Arthritis    knee  . COVID-19 04/10/2020  . Diabetes mellitus without complication ([facility])   . GERD (gastroesophageal  reflux disease)   . Hypertension     There are no problems to display for this patient.   Past Surgical History:  Procedure Laterality Date  . CARPAL TUNNEL RELEASE Right   . CESAREAN SECTION    . CHOLECYSTECTOMY    . CHONDROPLASTY Left 07/20/2017   Procedure: CHONDROPLASTY;  Surgeon: Signa Kell, MD;  Location: [facility];  Service: Orthopedics;  Laterality: Left;  Diabetic - insulin and oral meds  . GALLBLADDER SURGERY    . KNEE ARTHROSCOPY WITH MENISCAL REPAIR Left 07/20/2017   Procedure: KNEE ARTHROSCOPY WITH MENISCAL REPAIR;  Surgeon: Signa Kell, MD;  Location: [facility];  Service: Orthopedics;  Laterality: Left;  ADDUCTOR CANAL NERVE BLOCK  . KNEE SURGERY Right     Prior to Admission medications   Medication Sig Start Date End Date Taking? Authorizing Provider  acetaminophen (TYLENOL) 500 MG tablet Take by mouth.    [provider]  benzonatate (TESSALON) 100 MG capsule Take 1 capsule (100 mg total) by mouth 3 (three) times daily as needed. 09/15/18   Tommie Sams, DO  calcium carbonate (TUMS - DOSED IN MG ELEMENTAL CALCIUM) 500 MG chewable tablet Chew 1 tablet by mouth daily.    [provider]  empagliflozin (JARDIANCE) 10 MG TABS tablet Take 10 mg by mouth daily.    [provider]  ibuprofen (ADVIL) 600 MG tablet Take by mouth. 06/10/17   [provider]  insulin lispro (HUMALOG) 100 UNIT/ML injection Inject into the skin 3 (three) times daily before meals.    [provider]  LANTUS SOLOSTAR 100 UNIT/ML Solostar Pen Inject 40 Units into the skin at bedtime.  02/13/15   [provider]  metFORMIN (GLUCOPHAGE) 500 MG tablet Take by mouth 2 (two) times daily with a meal.    [provider]  metoprolol tartrate (LOPRESSOR) 25 MG tablet Take 25 mg by mouth 2 (two) times daily.    [provider]  ONE TOUCH ULTRA TEST test strip  02/16/15   [provider]  Dola Argyle LANCETS 33G  MISC  02/26/15   [provider]  TRULICITY 0.75 MG/0.5ML SOPN INJECT .75MG  SUBCUTANEOUSLY ONCE EACH WEEK 01/18/19   [provider]    Allergies Prednisone  Family History  Problem Relation Age of Onset  . Breast cancer Maternal Aunt        18s  . Diabetes Maternal Aunt   . Heart Problems Maternal Aunt   . Diabetes Mother   . Breast cancer Maternal Aunt 93  . Breast cancer Cousin        maternal  . Diabetes Sister   . Diabetes Brother   . Breast cancer Cousin        maternal    Social History Social History   Tobacco Use  . Smoking status: Never Smoker  . Smokeless tobacco: Never Used  Vaping Use  . Vaping Use: Never used  Substance Use Topics  . Alcohol use: No    Alcohol/week: 0.0 standard drinks  . Drug use: No    Review of Systems  Constitutional: Positive for fever/chills and generalized weakness. Eyes: No visual changes. ENT: No sore throat. Cardiovascular: Positive for chest pain in the setting of coughing. Respiratory: Positive shortness of breath nonproductive cough. Gastrointestinal: No abdominal pain.  No nausea, no vomiting.  No diarrhea.  No constipation. Genitourinary: Negative for dysuria. Musculoskeletal: Negative for back pain. Skin: Negative for rash. Neurological: Negative for headaches, focal weakness or numbness.   ____________________________________________   PHYSICAL EXAM:  VITAL SIGNS: Vitals:   04/17/20 2051 04/17/20 2240  BP:  100/72  Pulse:  89  Resp:  19  Temp: 98.3 F (36.8 C) 98.3 F (36.8 C)  SpO2:  94%      Constitutional: Alert and oriented.  Sitting up in bed with her eyes closed and appears uncomfortable.  Conversational in phrases only and with obvious tachypnea.  Warm to the touch Eyes: Conjunctivae are normal. PERRL. EOMI. Head: Atraumatic. Nose: No congestion/rhinnorhea. Mouth/Throat: Mucous membranes are dry.  Oropharynx non-erythematous. Neck: No stridor. No cervical spine tenderness to  palpation. Cardiovascular: Tachycardic rate, regular rhythm. Grossly normal heart sounds.  Good peripheral circulation. Respiratory:   No retractions. Lungs CTAB.  Tachypneic to about 30, otherwise no evidence of distress.  Clear lungs throughout. Gastrointestinal: Soft , nondistended, nontender to palpation. No abdominal bruits. No CVA tenderness. Musculoskeletal: No lower extremity tenderness nor edema.  No joint effusions. No signs of acute trauma. Neurologic:  Normal speech and language. No gross focal neurologic deficits are appreciated. No gait instability noted. Skin:  Skin is warm, dry and intact. No rash noted. Psychiatric: Mood and affect are normal. Speech and behavior are normal.  ____________________________________________   LABS (all labs ordered are listed, but only abnormal results are displayed)  Labs Reviewed  LACTIC ACID, PLASMA - Abnormal; Notable for the following components:      Result Value   Lactic Acid, Venous 2.6 (*)    All other components within normal limits  COMPREHENSIVE METABOLIC PANEL - Abnormal; Notable for  the following components:   Glucose, Bld 244 (*)    AST 48 (*)    All other components within normal limits  CBC WITH DIFFERENTIAL/PLATELET - Abnormal; Notable for the following components:   RBC 5.66 (*)    Hemoglobin 15.5 (*)    HCT 46.2 (*)    Lymphs Abs 0.4 (*)    All other components within normal limits  LACTIC ACID, PLASMA - Abnormal; Notable for the following components:   Lactic Acid, Venous 2.3 (*)    All other components within normal limits  BRAIN NATRIURETIC PEPTIDE  URINALYSIS, COMPLETE (UACMP) WITH MICROSCOPIC  TROPONIN I (HIGH SENSITIVITY)  TROPONIN I (HIGH SENSITIVITY)   ____________________________________________  12 Lead EKG  Sinus rhythm, rate of 118 bpm, left axis, normal intervals.  No evidence of acute ischemia. ____________________________________________  RADIOLOGY  ED MD interpretation: CXR with patchy  multifocal opacities consistent with known COVID-19  Official radiology report(s): DG Chest Portable 1 View  Result Date: 04/17/2020 CLINICAL DATA:  COVID-19, febrile, tachycardic EXAM: PORTABLE CHEST 1 VIEW COMPARISON:  Radiograph 11/11/2014 FINDINGS: Low lung volumes. Heterogenous bilateral airspace opacities more focally pronounced in the left lower lung with some associated air bronchograms. No pneumothorax or visible effusion. The cardiomediastinal contours are unremarkable. No acute osseous or soft tissue abnormality. Degenerative changes are present in the imaged spine and right shoulder. IMPRESSION: Low lung volumes with additional findings compatible with multifocal pneumonia in the setting of COVID-19 positivity. Electronically Signed   By: Kreg Shropshire M.D.   On: 04/17/2020 18:36    ____________________________________________   PROCEDURES and INTERVENTIONS  Procedure(s) performed (including Critical Care):  Procedures  Medications  acetaminophen (TYLENOL) tablet 650 mg (650 mg Oral Given 04/17/20 1742)  lactated ringers bolus 1,000 mL (0 mLs Intravenous Stopped 04/17/20 2051)  ketorolac (TORADOL) 30 MG/ML injection 15 mg (15 mg Intravenous Given 04/17/20 1908)    ____________________________________________   MDM / ED COURSE  69 year old diabetic woman presents to the ED with fever and shortness of breath, most consistent with COVID-19, and ultimately amenable to outpatient management.  Patient presented febrile and tachycardic, resolving after fluid resuscitation and Tylenol, otherwise normal vital signs on room air.  Exam initially with an uncomfortable-appearing tachycardic patient, who has no evidence of distress, trauma or neurovascular compromise.  After fluid resuscitation and symptomatic management, patient clinically looks well and reports marked improvement of her symptoms.  Blood work demonstrates isolated lactic acidosis without leukocytosis and hyperglycemia without  acidosis to suggest DKA.  Patient has no productive cough, leukocytosis or lobar infiltration to suggest a bacterial pneumonia or need for antibiotics.  Provided patient 1 L of lactated Ringer's with improving clearance of her lactic acid.  While did not clear all the way and resolved, did improve and patient clinically looks well.  Shared decision making at the bedside regarding observation medical admission versus discharge with PCP follow-up yields plan for outpatient management.  Patient is very motivated to go home and has the support of her husband and is well connected to her PCP.  We discussed outpatient management.  Discussed return precautions for the ED.  Patient medically stable for discharge home.  Clinical Course as of Apr 17 2250  Fri Apr 17, 2020  1943 Reassessed. Tachycardia has resolved and patient looks clinically improved. She reports feeling better and is eager to go home. I advised the patient that we need to ambulate her with pulse oximetry to ensure no hypoxia. We then have to repeat her lactic acid to  ensure it is cleared, and then she may be able to go home after that. We discussed antibody infusion clinic if going home.   [DS]  2057 RN informs me of no hypoxia with ambulation.   [DS]  2143 Reassessed.  Educated the patient and husband on improving lactic acidosis without total clearance.  We discussed her COVID-19 diagnosis and lack of hypoxia and how she is otherwise amenable to outpatient treatment.  Shared decision-making on the utility of discharge with close PCP follow-up versus medical admission now, patient is very eager to go home and is motivated to not be hospitalized.  I discussed treatment of COVID-19 as an outpatient and I urged the patient to push fluids to maintain her hydration and continue clearance of her lactic acid.  We discussed return precautions for the ED.   [DS]    Clinical Course User Index [DS] Delton PrairieSmith, Isahi Godwin, MD      ____________________________________________   FINAL CLINICAL IMPRESSION(S) / ED DIAGNOSES  Final diagnoses:  COVID-19  Hyperglycemia  Sinus tachycardia  Fever in other diseases  Lactic acidosis     ED Discharge Orders    None       Myka Lukins   Note:  This document was prepared using Dragon voice recognition software and may include unintentional dictation errors.   Delton PrairieSmith, Vadis Slabach, MD 04/17/20 [phone]

## 2020-04-17 NOTE — ED Notes (Signed)
Date and time results received: 04/17/20 1921  Test: lactic acid Critical Value: 2.6  Name of Provider Notified: dr Katrinka Blazing md

## 2020-04-17 NOTE — ED Triage Notes (Signed)
Pt reports diagnosed with COVID Friday and now with SOB and fever

## 2020-04-18 ENCOUNTER — Other Ambulatory Visit (HOSPITAL_COMMUNITY): Payer: Self-pay | Admitting: Nurse Practitioner

## 2020-04-18 ENCOUNTER — Encounter: Payer: Self-pay | Admitting: Nurse Practitioner

## 2020-04-18 DIAGNOSIS — U071 COVID-19: Secondary | ICD-10-CM

## 2020-04-18 NOTE — Progress Notes (Signed)
I connected by phone with Charlene Parker on 04/18/2020 at 12:31 PM to discuss the potential use of an new treatment for mild to moderate COVID-19 viral infection in non-hospitalized patients.  This patient is a 69 y.o. female that meets the FDA criteria for Emergency Use Authorization of casirivimab\imdevimab.  Has a (+) direct SARS-CoV-2 viral test result  Has mild or moderate COVID-19   Is ? 68 years of age and weighs ? 40 kg  Is NOT hospitalized due to COVID-19  Is NOT requiring oxygen therapy or requiring an increase in baseline oxygen flow rate due to COVID-19  Is within 10 days of symptom onset  Has at least one of the high risk factor(s) for progression to severe COVID-19 and/or hospitalization as defined in EUA.  Specific high risk criteria : Older age (>/= 68 yo), cad, dm, bmi  Onset 8/27.  Has had 1 moderna vaccine   I have spoken and communicated the following to the patient or parent/caregiver:  1. FDA has authorized the emergency use of casirivimab\imdevimab for the treatment of mild to moderate COVID-19 in adults and pediatric patients with positive results of direct SARS-CoV-2 viral testing who are 48 years of age and older weighing at least 40 kg, and who are at high risk for progressing to severe COVID-19 and/or hospitalization.  2. The significant known and potential risks and benefits of casirivimab\imdevimab, and the extent to which such potential risks and benefits are unknown.  3. Information on available alternative treatments and the risks and benefits of those alternatives, including clinical trials.  4. Patients treated with casirivimab\imdevimab should continue to self-isolate and use infection control measures (e.g., wear mask, isolate, social distance, avoid sharing personal items, clean and disinfect "high touch" surfaces, and frequent handwashing) according to CDC guidelines.   5. The patient or parent/caregiver has the option to accept or refuse  casirivimab\imdevimab .  After reviewing this information with the patient, The patient agreed to proceed with receiving casirivimab\imdevimab infusion and will be provided a copy of the Fact sheet prior to receiving the infusion.Consuello Masse, DNP, AGNP-C [phone] (Infusion Center Hotline)

## 2020-04-19 ENCOUNTER — Inpatient Hospital Stay (HOSPITAL_COMMUNITY)
Admission: EM | Admit: 2020-04-19 | Discharge: 2020-05-08 | DRG: 177 | Disposition: A | Discharge status: Skilled Nursing Facility | Payer: Managed Care, Other (non HMO) | Attending: Internal Medicine | Admitting: Internal Medicine

## 2020-04-19 ENCOUNTER — Other Ambulatory Visit: Payer: Self-pay

## 2020-04-19 ENCOUNTER — Emergency Department (HOSPITAL_COMMUNITY): Payer: Managed Care, Other (non HMO)

## 2020-04-19 ENCOUNTER — Ambulatory Visit (HOSPITAL_COMMUNITY)
Admission: RE | Admit: 2020-04-19 | Discharge: 2020-04-19 | Disposition: A | Discharge status: Home / Self Care | Payer: Managed Care, Other (non HMO) | Source: Ambulatory Visit | Attending: Pulmonary Disease | Admitting: Pulmonary Disease

## 2020-04-19 ENCOUNTER — Encounter (HOSPITAL_COMMUNITY): Payer: Self-pay

## 2020-04-19 DIAGNOSIS — E440 Moderate protein-calorie malnutrition: Secondary | ICD-10-CM | POA: Diagnosis present

## 2020-04-19 DIAGNOSIS — I152 Hypertension secondary to endocrine disorders: Secondary | ICD-10-CM

## 2020-04-19 DIAGNOSIS — Z833 Family history of diabetes mellitus: Secondary | ICD-10-CM | POA: Diagnosis not present

## 2020-04-19 DIAGNOSIS — Z781 Physical restraint status: Secondary | ICD-10-CM

## 2020-04-19 DIAGNOSIS — I10 Essential (primary) hypertension: Secondary | ICD-10-CM | POA: Diagnosis present

## 2020-04-19 DIAGNOSIS — Z794 Long term (current) use of insulin: Secondary | ICD-10-CM

## 2020-04-19 DIAGNOSIS — U071 COVID-19: Secondary | ICD-10-CM | POA: Diagnosis present

## 2020-04-19 DIAGNOSIS — Z803 Family history of malignant neoplasm of breast: Secondary | ICD-10-CM

## 2020-04-19 DIAGNOSIS — Z9049 Acquired absence of other specified parts of digestive tract: Secondary | ICD-10-CM | POA: Diagnosis not present

## 2020-04-19 DIAGNOSIS — G9341 Metabolic encephalopathy: Secondary | ICD-10-CM | POA: Diagnosis present

## 2020-04-19 DIAGNOSIS — E870 Hyperosmolality and hypernatremia: Secondary | ICD-10-CM | POA: Diagnosis not present

## 2020-04-19 DIAGNOSIS — Z6837 Body mass index (BMI) 37.0-37.9, adult: Secondary | ICD-10-CM

## 2020-04-19 DIAGNOSIS — E1165 Type 2 diabetes mellitus with hyperglycemia: Secondary | ICD-10-CM | POA: Diagnosis not present

## 2020-04-19 DIAGNOSIS — R7989 Other specified abnormal findings of blood chemistry: Secondary | ICD-10-CM | POA: Diagnosis not present

## 2020-04-19 DIAGNOSIS — Z79899 Other long term (current) drug therapy: Secondary | ICD-10-CM | POA: Diagnosis not present

## 2020-04-19 DIAGNOSIS — T380X5A Adverse effect of glucocorticoids and synthetic analogues, initial encounter: Secondary | ICD-10-CM | POA: Diagnosis not present

## 2020-04-19 DIAGNOSIS — R9431 Abnormal electrocardiogram [ECG] [EKG]: Secondary | ICD-10-CM | POA: Diagnosis present

## 2020-04-19 DIAGNOSIS — G9349 Other encephalopathy: Secondary | ICD-10-CM | POA: Diagnosis not present

## 2020-04-19 DIAGNOSIS — J9601 Acute respiratory failure with hypoxia: Secondary | ICD-10-CM | POA: Diagnosis present

## 2020-04-19 DIAGNOSIS — R0902 Hypoxemia: Secondary | ICD-10-CM

## 2020-04-19 DIAGNOSIS — E876 Hypokalemia: Secondary | ICD-10-CM | POA: Diagnosis present

## 2020-04-19 DIAGNOSIS — Z4659 Encounter for fitting and adjustment of other gastrointestinal appliance and device: Secondary | ICD-10-CM

## 2020-04-19 DIAGNOSIS — E11649 Type 2 diabetes mellitus with hypoglycemia without coma: Secondary | ICD-10-CM | POA: Diagnosis present

## 2020-04-19 DIAGNOSIS — Y92239 Unspecified place in hospital as the place of occurrence of the external cause: Secondary | ICD-10-CM | POA: Diagnosis not present

## 2020-04-19 DIAGNOSIS — E162 Hypoglycemia, unspecified: Secondary | ICD-10-CM | POA: Diagnosis present

## 2020-04-19 DIAGNOSIS — K219 Gastro-esophageal reflux disease without esophagitis: Secondary | ICD-10-CM | POA: Diagnosis present

## 2020-04-19 DIAGNOSIS — J1282 Pneumonia due to coronavirus disease 2019: Secondary | ICD-10-CM | POA: Diagnosis present

## 2020-04-19 DIAGNOSIS — Z978 Presence of other specified devices: Secondary | ICD-10-CM

## 2020-04-19 LAB — COMPREHENSIVE METABOLIC PANEL WITH GFR
ALT: 28 U/L (ref 0–44)
AST: 40 U/L (ref 15–41)
Albumin: 3.2 g/dL — ABNORMAL LOW (ref 3.5–5.0)
Alkaline Phosphatase: 45 U/L (ref 38–126)
Anion gap: 13 (ref 5–15)
BUN: 16 mg/dL (ref 8–23)
CO2: 25 mmol/L (ref 22–32)
Calcium: 9.3 mg/dL (ref 8.9–10.3)
Chloride: 107 mmol/L (ref 98–111)
Creatinine, Ser: 0.87 mg/dL (ref 0.44–1.00)
GFR calc Af Amer: >60 mL/min
GFR calc non Af Amer: >60 mL/min
Glucose, Bld: 61 mg/dL — ABNORMAL LOW (ref 70–99)
Potassium: 3 mmol/L — ABNORMAL LOW (ref 3.5–5.1)
Sodium: 145 mmol/L (ref 135–145)
Total Bilirubin: 0.7 mg/dL (ref 0.3–1.2)
Total Protein: 7.9 g/dL (ref 6.5–8.1)

## 2020-04-19 LAB — BLOOD GAS, ARTERIAL
Acid-Base Excess: 1.8 mmol/L (ref 0.0–2.0)
Bicarbonate: 24 mmol/L (ref 20.0–28.0)
FIO2: 44
O2 Saturation: 93.4 %
Patient temperature: 98.6
pCO2 arterial: 31.9 mmHg — ABNORMAL LOW (ref 32.0–48.0)
pH, Arterial: 7.489 — ABNORMAL HIGH (ref 7.350–7.450)
pO2, Arterial: 66.9 mmHg — ABNORMAL LOW (ref 83.0–108.0)

## 2020-04-19 LAB — CBG MONITORING, ED
Glucose-Capillary: 59 mg/dL — ABNORMAL LOW (ref 70–99)
Glucose-Capillary: 89 mg/dL (ref 70–99)
Glucose-Capillary: 278 mg/dL — ABNORMAL HIGH (ref 70–99)

## 2020-04-19 LAB — C-REACTIVE PROTEIN: CRP: 19.4 mg/dL — ABNORMAL HIGH

## 2020-04-19 LAB — CBC WITH DIFFERENTIAL/PLATELET
Abs Immature Granulocytes: 0.17 K/uL — ABNORMAL HIGH (ref 0.00–0.07)
Basophils Absolute: 0 K/uL (ref 0.0–0.1)
Basophils Relative: 0 %
Eosinophils Absolute: 0 K/uL (ref 0.0–0.5)
Eosinophils Relative: 0 %
HCT: 46.7 % — ABNORMAL HIGH (ref 36.0–46.0)
Hemoglobin: 15.3 g/dL — ABNORMAL HIGH (ref 12.0–15.0)
Immature Granulocytes: 2 %
Lymphocytes Relative: 8 %
Lymphs Abs: 0.8 K/uL (ref 0.7–4.0)
MCH: 27.1 pg (ref 26.0–34.0)
MCHC: 32.8 g/dL (ref 30.0–36.0)
MCV: 82.7 fL (ref 80.0–100.0)
Monocytes Absolute: 0.3 K/uL (ref 0.1–1.0)
Monocytes Relative: 3 %
Neutro Abs: 7.9 K/uL — ABNORMAL HIGH (ref 1.7–7.7)
Neutrophils Relative %: 87 %
Platelets: 242 K/uL (ref 150–400)
RBC: 5.65 MIL/uL — ABNORMAL HIGH (ref 3.87–5.11)
RDW: 13 % (ref 11.5–15.5)
WBC: 9.1 K/uL (ref 4.0–10.5)
nRBC: 0 % (ref 0.0–0.2)

## 2020-04-19 LAB — FERRITIN: Ferritin: 670 ng/mL — ABNORMAL HIGH (ref 11–307)

## 2020-04-19 LAB — FIBRINOGEN: Fibrinogen: >800 mg/dL — ABNORMAL HIGH (ref 210–475)

## 2020-04-19 LAB — TRIGLYCERIDES: Triglycerides: 96 mg/dL

## 2020-04-19 LAB — LACTIC ACID, PLASMA
Lactic Acid, Venous: 1.9 mmol/L (ref 0.5–1.9)
Lactic Acid, Venous: 1.1 mmol/L (ref 0.5–1.9)

## 2020-04-19 LAB — PROCALCITONIN: Procalcitonin: 0.1 ng/mL

## 2020-04-19 LAB — LACTATE DEHYDROGENASE: LDH: 460 U/L — ABNORMAL HIGH (ref 98–192)

## 2020-04-19 LAB — D-DIMER, QUANTITATIVE: D-Dimer, Quant: 1.01 ug{FEU}/mL — ABNORMAL HIGH (ref 0.00–0.50)

## 2020-04-19 MED ORDER — PREDNISONE 50 MG PO TABS: 50.0000 mg | ORAL_TABLET | Freq: Every day | ORAL | Status: DC

## 2020-04-19 MED ORDER — ENOXAPARIN SODIUM 40 MG/0.4ML ~~LOC~~ SOLN
40.0000 mg | SUBCUTANEOUS | Status: DC
  Administered 2020-04-19 – 2020-04-30 (×12): 40 mg via SUBCUTANEOUS
  Filled 2020-04-19 (×12): qty 0.4

## 2020-04-19 MED ORDER — METOPROLOL TARTRATE 25 MG PO TABS
25.0000 mg | ORAL_TABLET | Freq: Two times a day (BID) | ORAL | Status: DC
  Administered 2020-04-19 – 2020-04-21 (×5): 25 mg via ORAL
  Filled 2020-04-19 (×9): qty 1

## 2020-04-19 MED ORDER — ALBUTEROL SULFATE HFA 108 (90 BASE) MCG/ACT IN AERS
2.0000 | INHALATION_SPRAY | Freq: Four times a day (QID) | RESPIRATORY_TRACT | Status: DC
  Administered 2020-04-19 – 2020-04-20 (×5): 2 via RESPIRATORY_TRACT
  Filled 2020-04-19 (×2): qty 6.7

## 2020-04-19 MED ORDER — METHYLPREDNISOLONE SODIUM SUCC 125 MG IJ SOLR
0.5000 mg/kg | Freq: Two times a day (BID) | INTRAMUSCULAR | Status: DC
  Administered 2020-04-20: 48.125 mg via INTRAVENOUS
  Filled 2020-04-19 (×2): qty 2

## 2020-04-19 MED ORDER — SODIUM CHLORIDE 0.9 % IV SOLN
200.0000 mg | Freq: Once | INTRAVENOUS | Status: AC
  Administered 2020-04-19: 200 mg via INTRAVENOUS
  Filled 2020-04-19: qty 200

## 2020-04-19 MED ORDER — INSULIN ASPART 100 UNIT/ML ~~LOC~~ SOLN
0.0000 [IU] | Freq: Every day | SUBCUTANEOUS | Status: DC
  Administered 2020-04-19: 3 [IU] via SUBCUTANEOUS
  Filled 2020-04-19: qty 0.05

## 2020-04-19 MED ORDER — BARICITINIB 2 MG PO TABS
4.0000 mg | ORAL_TABLET | Freq: Every day | ORAL | Status: DC
  Administered 2020-04-19 – 2020-04-29 (×5): 4 mg via ORAL
  Filled 2020-04-19 (×7): qty 2

## 2020-04-19 MED ORDER — DEXAMETHASONE SODIUM PHOSPHATE 10 MG/ML IJ SOLN
6.0000 mg | Freq: Once | INTRAMUSCULAR | Status: AC
  Administered 2020-04-19: 6 mg via INTRAVENOUS
  Filled 2020-04-19: qty 1

## 2020-04-19 MED ORDER — GUAIFENESIN-DM 100-10 MG/5ML PO SYRP
10.0000 mL | ORAL_SOLUTION | ORAL | Status: DC | PRN
  Administered 2020-04-28 – 2020-04-29 (×2): 10 mL via ORAL
  Filled 2020-04-19 (×3): qty 10

## 2020-04-19 MED ORDER — LINAGLIPTIN 5 MG PO TABS
5.0000 mg | ORAL_TABLET | Freq: Every day | ORAL | Status: DC
  Administered 2020-04-19 – 2020-04-30 (×5): 5 mg via ORAL
  Filled 2020-04-19 (×9): qty 1

## 2020-04-19 MED ORDER — INSULIN ASPART 100 UNIT/ML ~~LOC~~ SOLN
0.0000 [IU] | Freq: Three times a day (TID) | SUBCUTANEOUS | Status: DC
  Administered 2020-04-20 (×2): 7 [IU] via SUBCUTANEOUS
  Filled 2020-04-19: qty 0.2

## 2020-04-19 MED ORDER — SODIUM CHLORIDE 0.9 % IV SOLN
100.0000 mg | Freq: Every day | INTRAVENOUS | Status: AC
  Administered 2020-04-20 – 2020-04-23 (×4): 100 mg via INTRAVENOUS
  Filled 2020-04-19 (×4): qty 20

## 2020-04-19 MED ORDER — DEXTROSE 50 % IV SOLN
1.0000 | Freq: Once | INTRAVENOUS | Status: AC
  Administered 2020-04-19: 50 mL via INTRAVENOUS
  Filled 2020-04-19: qty 50

## 2020-04-19 MED ORDER — INSULIN DETEMIR 100 UNIT/ML ~~LOC~~ SOLN
0.1500 [IU]/kg | Freq: Two times a day (BID) | SUBCUTANEOUS | Status: DC
  Administered 2020-04-19 – 2020-04-20 (×3): 14 [IU] via SUBCUTANEOUS
  Filled 2020-04-19 (×4): qty 0.14

## 2020-04-19 MED ORDER — SODIUM CHLORIDE 0.9 % IV SOLN: 200.0000 mg | Freq: Once | INTRAVENOUS | Status: DC

## 2020-04-19 MED ORDER — SODIUM CHLORIDE 0.9 % IV SOLN: 100.0000 mg | Freq: Every day | INTRAVENOUS | Status: DC

## 2020-04-19 MED ORDER — POTASSIUM CHLORIDE 10 MEQ/100ML IV SOLN
10.0000 meq | Freq: Once | INTRAVENOUS | Status: AC
  Administered 2020-04-19: 10 meq via INTRAVENOUS
  Filled 2020-04-19: qty 100

## 2020-04-19 MED ORDER — ACETAMINOPHEN 325 MG PO TABS
650.0000 mg | ORAL_TABLET | Freq: Four times a day (QID) | ORAL | Status: DC | PRN
  Filled 2020-04-19: qty 2

## 2020-04-19 NOTE — Progress Notes (Signed)
Pt presented to 1W for [facility]en-cov infusion. Upon checking Vitals, her O2 sats were 82% on Room Air. Pt denies oxygen use at home. Pt's breathing labored. Placed pt on supplemental O2, sats at 88% on 6L O2. Then transferred her to a Non-rebreather @ 15L with sats at 99%. Lillia Abed, NP called as well as [facility] ED Charge Nurse Darl Pikes at [phone]. Charge nurse Darl Pikes sent Trey Paula the [facility] over to evaluate patient at 1035. Trey Paula wheeled patient to the ED at 1050.

## 2020-04-19 NOTE — ED Notes (Signed)
Patient repositioned for comfort.

## 2020-04-19 NOTE — ED Notes (Signed)
Patient refused solumedrol injection. States "I am highly allergic and I want none whatsoever. Make sure that is in my chart." MD made aware.
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
## 2020-04-19 NOTE — ED Notes (Signed)
Pt BIB infusion clinic staff. Pt is covid positive; pt was there to get infusion and started having SOB. Pt was stating at 84%; pt was placed on NRB. Pt was then weaned down to [facility] stating at low 90s.

## 2020-04-19 NOTE — H&P (Signed)
History and Physical        Hospital Admission Note Date: 04/19/2020  Patient name: Charlene SimsGlenda H Hockman Medical record number: [id number] Date of birth: 02/09/1951 Age: 69 y.o. Gender: female  PCP: Jaclyn Shaggyate, Denny C, MD  Patient coming from: home Lives with: husband At baseline, ambulates: independently  Chief Complaint    Chief Complaint  Patient presents with  . Shortness of Breath      HPI:   Patient has altered mental status and is a poor historian.  History obtained from ED physician note and husband over the phone  This is a 69 year old female with past medical history of hypertension, GERD, diabetes who presented to the ED via EMS with shortness of breath.  Patient received her first dose of the Moderna vaccine on Monday 8/23 however began having Covid-like symptoms on 8/27 and tested positive at 8/28.  She went to [facility] on 9/30 for shortness of breath, generalized weakness and substernal chest pain and cough and was treated with LR.  She was discharged home from the ED.  She was directed to go to the infusion clinic to get monoclonal antibody however when she went to the infusion clinic this morning she was found to be hypoxic on room air at 84%.  Upon arrival, EMS initially placed her on NRB however she was weaned to 4 L/min.   Husband denies any recent sick contacts or recent travel.  ED Course: In the ED she seemed confused and was stating to the ED physician that EMS stole her orange and M&Ms and was disoriented.  CBG was checked and noted to be 59.  She received D50 and an ABG was unremarkable though she was on supplemental O2 at the time.  Repeat CBG 89, she was given juice.  She repeatedly took the nasal cannula off her face and was satting at 81% on room air.  ED note and was placed back on 6 L.  Notable labs: K3.0, glucose 61, LDH 460, ferritin 670, CRP 19.4, lactic acid 2.3->  1.9, procalcitonin 0.1, WBC 9.1, D-dimer 1.01 and fibrinogen> 800.  CXR consistent with multifocal pneumonia.  Started on Decadron and remdesivir in the ED.  Vitals:   04/19/20 1145 04/19/20 1307  BP: 125/75   Pulse: (!) 101   Resp: (!) 28   Temp:  98.7 F (37.1 C)  SpO2: 96%      Review of Systems:  Review of Systems  Constitutional: Negative for fever.  Respiratory: Positive for cough and shortness of breath.   Cardiovascular: Negative for chest pain.  Gastrointestinal: Positive for nausea. Negative for vomiting.  Musculoskeletal: Positive for back pain and myalgias.  All other systems reviewed and are negative.   Medical/Social/Family History   Past Medical History: Past Medical History:  Diagnosis Date  . Arthritis    knee  . COVID-19 04/10/2020  . Diabetes mellitus without complication (HCC)   . GERD (gastroesophageal reflux disease)   . Hypertension     Past Surgical History:  Procedure Laterality Date  . CARPAL TUNNEL RELEASE Right   . CESAREAN SECTION    . CHOLECYSTECTOMY    . CHONDROPLASTY Left 07/20/2017   Procedure: CHONDROPLASTY;  Surgeon: Signa KellPatel, Sunny, MD;  Location:  [facility];  Service: Orthopedics;  Laterality: Left;  Diabetic - insulin and oral meds  . GALLBLADDER SURGERY    . KNEE ARTHROSCOPY WITH MENISCAL REPAIR Left 07/20/2017   Procedure: KNEE ARTHROSCOPY WITH MENISCAL REPAIR;  Surgeon: Signa Kell, MD;  Location: [facility];  Service: Orthopedics;  Laterality: Left;  ADDUCTOR CANAL NERVE BLOCK  . KNEE SURGERY Right     Medications: Prior to Admission medications   Medication Sig Start Date End Date Taking? Authorizing Provider  acetaminophen (TYLENOL) 500 MG tablet Take by mouth.    [provider]  benzonatate (TESSALON) 100 MG capsule Take 1 capsule (100 mg total) by mouth 3 (three) times daily as needed. 09/15/18   Tommie Sams, DO  calcium carbonate (TUMS - DOSED IN MG ELEMENTAL CALCIUM) 500 MG chewable tablet  Chew 1 tablet by mouth daily.    [provider]  empagliflozin (JARDIANCE) 10 MG TABS tablet Take 10 mg by mouth daily.    [provider]  ibuprofen (ADVIL) 600 MG tablet Take by mouth. 06/10/17   [provider]  insulin lispro (HUMALOG) 100 UNIT/ML injection Inject into the skin 3 (three) times daily before meals.    [provider]  LANTUS SOLOSTAR 100 UNIT/ML Solostar Pen Inject 40 Units into the skin at bedtime.  02/13/15   [provider]  metFORMIN (GLUCOPHAGE) 500 MG tablet Take by mouth 2 (two) times daily with a meal.    [provider]  metoprolol tartrate (LOPRESSOR) 25 MG tablet Take 25 mg by mouth 2 (two) times daily.    [provider]  ONE TOUCH ULTRA TEST test strip  02/16/15   [provider]  Dola Argyle LANCETS 33G MISC  02/26/15   [provider]  TRULICITY 0.75 MG/0.5ML SOPN INJECT .75MG  EACH WEEK 01/18/19   [provider]    Allergies:   Allergies  Allergen Reactions  . Prednisone     Made glucose levels go EXTREMELY high    Social History:  reports that she has never smoked. She has never used smokeless tobacco. She reports that she does not drink alcohol and does not use drugs.  Family History: Family History  Problem Relation Age of Onset  . Breast cancer Maternal Aunt        71s  . Diabetes Maternal Aunt   . Heart Problems Maternal Aunt   . Diabetes Mother   . Breast cancer Maternal Aunt 93  . Breast cancer Cousin        maternal  . Diabetes Sister   . Diabetes Brother   . Breast cancer Cousin        maternal     Objective   Physical Exam: Blood pressure 125/75, pulse (!) 101, temperature 98.7 F (37.1 C), temperature source Oral, resp. rate (!) 28, SpO2 96 %.  Physical Exam Vitals and nursing note reviewed.  Constitutional:      Appearance: Normal appearance. She is obese.     Comments: Sitting upright  HENT:     Head:  Normocephalic and atraumatic.  Eyes:     Conjunctiva/sclera: Conjunctivae normal.  Cardiovascular:     Rate and Rhythm: Normal rate.     Pulses: Normal pulses.  Pulmonary:     Effort: Pulmonary effort is normal. No tachypnea.     Comments: SPO2 90 to 94% on 5 L/min Abdominal:     General: Abdomen is flat.     Palpations: Abdomen is soft.  Musculoskeletal:        General: No swelling or tenderness.  Skin:    Coloration: Skin is not jaundiced or pale.  Neurological:     Mental Status: She is alert. Mental status is at baseline. She is disoriented.     Comments: Awake alert oriented x2 (not to place)  Psychiatric:        Mood and Affect: Mood is not anxious.        Behavior: Behavior is not agitated.     LABS on Admission: I have personally reviewed all the labs and imaging below    Basic Metabolic Panel: Recent Labs  Lab 04/17/20 1829 04/19/20 1223  NA 139 145  K 3.5 3.0*  CL 103 107  CO2 22 25  GLUCOSE 244* 61*  BUN 20 16  CREATININE 0.97 0.87  CALCIUM 9.0 9.3   Liver Function Tests: Recent Labs  Lab 04/17/20 1829 04/19/20 1223  AST 48* 40  ALT 37 28  ALKPHOS 46 45  BILITOT 0.8 0.7  PROT 8.0 7.9  ALBUMIN 3.5 3.2*   No results for input(s): LIPASE, AMYLASE in the last 168 hours. No results for input(s): AMMONIA in the last 168 hours. CBC: Recent Labs  Lab 04/17/20 1829 04/17/20 1829 04/19/20 1223  WBC 7.2  --  9.1  NEUTROABS 6.5   < > 7.9*  HGB 15.5*  --  15.3*  HCT 46.2*  --  46.7*  MCV 81.6   < > 82.7  PLT 183  --  242   < > = values in this interval not displayed.   Cardiac Enzymes: No results for input(s): CKTOTAL, CKMB, CKMBINDEX, TROPONINI in the last 168 hours. BNP: Invalid input(s): POCBNP CBG: Recent Labs  Lab 04/19/20 1304 04/19/20 1534  GLUCAP 59* 89    Radiological Exams on Admission:  DG Chest Port 1 View  Result Date: 04/19/2020 CLINICAL DATA:  Shortness of breath, COVID-19 positive. EXAM: PORTABLE CHEST 1 VIEW COMPARISON:   April 17, 2020. FINDINGS: Stable cardiomediastinal silhouette. Increased bilateral airspace opacities are noted consistent with multifocal pneumonia. No pneumothorax or pleural effusion is noted. The visualized skeletal structures are unremarkable. IMPRESSION: Increased bilateral airspace opacities are noted consistent with multifocal pneumonia. Electronically Signed   By: Lupita Raider M.D.   On: 04/19/2020 12:56   DG Chest Portable 1 View  Result Date: 04/17/2020 CLINICAL DATA:  COVID-19, febrile, tachycardic EXAM: PORTABLE CHEST 1 VIEW COMPARISON:  Radiograph 11/11/2014 FINDINGS: Low lung volumes. Heterogenous bilateral airspace opacities more focally pronounced in the left lower lung with some associated air bronchograms. No pneumothorax or visible effusion. The cardiomediastinal contours are unremarkable. No acute osseous or soft tissue abnormality. Degenerative changes are present in the imaged spine and right shoulder. IMPRESSION: Low lung volumes with additional findings compatible with multifocal pneumonia in the setting of COVID-19 positivity. Electronically Signed   By: Kreg Shropshire M.D.   On: 04/17/2020 18:36      EKG: Independently reviewed.    A & P   Principal Problem:   Acute hypoxemic respiratory failure due to COVID-19 [facility]) Active Problems:   Encephalopathy due to COVID-19 virus   Hypoglycemia   Essential hypertension   Type 2 diabetes mellitus with hypoglycemia without coma (HCC)   1. Acute hypoxemic respiratory failure secondary to COVID-19 a. Afebrile, hemodynamically stable requiring 5 to 6 L nasal cannula b. Satting in the 80s on room air and in the infusion center today, did not get the monoclonal ab infusion c.  Received Moderna x1 on 8/23  d. Start remdesivir, steroids, baricitinib, incentive spirometry, inhalers, antitussives  2. Acute encephalopathy secondary to COVID-19 and hypoglycemia a. Oriented x2 b. Treat COVID-19 as above c. Hypoglycemia  protocol and encourage p.o. intake  3. Hypoglycemia a. Unclear if she took her insulin this morning but she has had poor p.o. intake her husband b. Received D50 x1 c. Monitor glucose with starting steroids and while eating  4. Hypertension a. Continue home metoprolol  5. Diabetes a. Check HbA1c b. Hold home meds c. Start linagliptin and insulin per COVID-19 steroid order set   DVT prophylaxis: Lovenox   Code Status: Not on file  Diet: Heart healthy/carb control Family Communication: Admission, patients condition and plan of care including tests being ordered have been discussed with the patient who indicates understanding and agrees with the plan and Code Status. Patient's husband was updated  Disposition Plan: The appropriate patient status for this patient is INPATIENT. Inpatient status is judged to be reasonable and necessary in order to provide the required intensity of service to ensure the patient's safety. The patient's presenting symptoms, physical exam findings, and initial radiographic and laboratory data in the context of their chronic comorbidities is felt to place them at high risk for further clinical deterioration. Furthermore, it is not anticipated that the patient will be medically stable for discharge from the hospital within 2 midnights of admission. The following factors support the patient status of inpatient.   " The patient's presenting symptoms include shortness of breath, confusion. " The worrisome physical exam findings include confusion, sitting upright. " The initial radiographic and laboratory data are worrisome because of multifocal pneumonia on CXR, recently diagnosed with COVID 19. " The chronic co-morbidities include obesity, diabetes, hypertension.   * I certify that at the point of admission it is my clinical judgment that the patient will require inpatient hospital care spanning beyond 2 midnights from the point of admission due to high intensity of  service, high risk for further deterioration and high frequency of surveillance required.*   Status is: Inpatient  Remains inpatient appropriate because:Altered mental status, IV treatments appropriate due to intensity of illness or inability to take PO and Inpatient level of care appropriate due to severity of illness   Dispo: The patient is from: Home              Anticipated d/c is to: TBD              Anticipated d/c date is: 3 days              Patient currently is not medically stable to d/c.   Consultants  . none  Procedures  . none  Time Spent on Admission: 64 minutes    Jae Dire, DO [facility] Pager [phone] 04/19/2020, 4:37 PM

## 2020-04-20 DIAGNOSIS — K219 Gastro-esophageal reflux disease without esophagitis: Secondary | ICD-10-CM

## 2020-04-20 DIAGNOSIS — E876 Hypokalemia: Secondary | ICD-10-CM

## 2020-04-20 LAB — COMPREHENSIVE METABOLIC PANEL
ALT: 27 U/L (ref 0–44)
AST: 35 U/L (ref 15–41)
Albumin: 2.8 g/dL — ABNORMAL LOW (ref 3.5–5.0)
Alkaline Phosphatase: 42 U/L (ref 38–126)
Anion gap: 13 (ref 5–15)
BUN: 21 mg/dL (ref 8–23)
CO2: 23 mmol/L (ref 22–32)
Calcium: 9.1 mg/dL (ref 8.9–10.3)
Chloride: 104 mmol/L (ref 98–111)
Creatinine, Ser: 0.76 mg/dL (ref 0.44–1.00)
GFR calc Af Amer: >60 mL/min (ref >60)
GFR calc non Af Amer: >60 mL/min (ref >60)
Glucose, Bld: 237 mg/dL — ABNORMAL HIGH (ref 70–99)
Potassium: 3.8 mmol/L (ref 3.5–5.1)
Sodium: 140 mmol/L (ref 135–145)
Total Bilirubin: 0.8 mg/dL (ref 0.3–1.2)
Total Protein: 7 g/dL (ref 6.5–8.1)

## 2020-04-20 LAB — CBC WITH DIFFERENTIAL/PLATELET
Abs Immature Granulocytes: 0.08 10*3/uL — ABNORMAL HIGH (ref 0.00–0.07)
Basophils Absolute: 0 10*3/uL (ref 0.0–0.1)
Basophils Relative: 1 %
Eosinophils Absolute: 0 10*3/uL (ref 0.0–0.5)
Eosinophils Relative: 0 %
HCT: 45.9 % (ref 36.0–46.0)
Hemoglobin: 14.9 g/dL (ref 12.0–15.0)
Immature Granulocytes: 2 %
Lymphocytes Relative: 14 %
Lymphs Abs: 0.7 10*3/uL (ref 0.7–4.0)
MCH: 26.9 pg (ref 26.0–34.0)
MCHC: 32.5 g/dL (ref 30.0–36.0)
MCV: 83 fL (ref 80.0–100.0)
Monocytes Absolute: 0.2 10*3/uL (ref 0.1–1.0)
Monocytes Relative: 4 %
Neutro Abs: 4.2 10*3/uL (ref 1.7–7.7)
Neutrophils Relative %: 79 %
Platelets: 230 10*3/uL (ref 150–400)
RBC: 5.53 MIL/uL — ABNORMAL HIGH (ref 3.87–5.11)
RDW: 13.1 % (ref 11.5–15.5)
WBC: 5.2 10*3/uL (ref 4.0–10.5)
nRBC: 0 % (ref 0.0–0.2)

## 2020-04-20 LAB — C-REACTIVE PROTEIN: CRP: 15.1 mg/dL — ABNORMAL HIGH (ref <1.0)

## 2020-04-20 LAB — D-DIMER, QUANTITATIVE: D-Dimer, Quant: 1.14 ug/mL-FEU — ABNORMAL HIGH (ref 0.00–0.50)

## 2020-04-20 LAB — HIV ANTIBODY (ROUTINE TESTING W REFLEX): HIV Screen 4th Generation wRfx: NONREACTIVE

## 2020-04-20 LAB — FERRITIN: Ferritin: 670 ng/mL — ABNORMAL HIGH (ref 11–307)

## 2020-04-20 LAB — MAGNESIUM: Magnesium: 2.4 mg/dL (ref 1.7–2.4)

## 2020-04-20 MED ORDER — INSULIN ASPART 100 UNIT/ML ~~LOC~~ SOLN
0.0000 [IU] | Freq: Every day | SUBCUTANEOUS | Status: DC
  Administered 2020-04-23: 3 [IU] via SUBCUTANEOUS
  Filled 2020-04-20: qty 0.05

## 2020-04-20 MED ORDER — PANTOPRAZOLE SODIUM 40 MG PO TBEC
40.0000 mg | DELAYED_RELEASE_TABLET | Freq: Every day | ORAL | Status: DC
  Administered 2020-04-20 – 2020-04-21 (×2): 40 mg via ORAL
  Filled 2020-04-20 (×4): qty 1

## 2020-04-20 MED ORDER — INSULIN ASPART 100 UNIT/ML ~~LOC~~ SOLN
0.0000 [IU] | Freq: Three times a day (TID) | SUBCUTANEOUS | Status: DC
  Administered 2020-04-20 – 2020-04-21 (×3): 3 [IU] via SUBCUTANEOUS
  Administered 2020-04-21: 11 [IU] via SUBCUTANEOUS
  Administered 2020-04-22: 5 [IU] via SUBCUTANEOUS
  Administered 2020-04-22: 8 [IU] via SUBCUTANEOUS
  Administered 2020-04-22: 3 [IU] via SUBCUTANEOUS
  Administered 2020-04-23 (×2): 5 [IU] via SUBCUTANEOUS
  Administered 2020-04-23: 3 [IU] via SUBCUTANEOUS
  Administered 2020-04-24: 15 [IU] via SUBCUTANEOUS
  Filled 2020-04-20: qty 0.15

## 2020-04-20 MED ORDER — METHYLPREDNISOLONE SODIUM SUCC 125 MG IJ SOLR
60.0000 mg | Freq: Two times a day (BID) | INTRAMUSCULAR | Status: DC
  Administered 2020-04-20 – 2020-04-29 (×18): 60 mg via INTRAVENOUS
  Filled 2020-04-20 (×18): qty 2

## 2020-04-20 MED ORDER — INSULIN ASPART 100 UNIT/ML ~~LOC~~ SOLN
6.0000 [IU] | Freq: Three times a day (TID) | SUBCUTANEOUS | Status: DC
  Administered 2020-04-21: 6 [IU] via SUBCUTANEOUS
  Filled 2020-04-20: qty 0.06

## 2020-04-20 NOTE — ED Notes (Signed)
Pt was made aware for need of urine specimen.  

## 2020-04-20 NOTE — ED Notes (Signed)
Advised pt U/A per MD order. Pt provided beside commode and toileting items; advised to call for assistance as needed. [employer]

## 2020-04-20 NOTE — Progress Notes (Signed)
PROGRESS NOTE  Charlene Parker:[id number] DOB: [birth year]   PCP: Jaclyn Shaggy, MD  Patient is from: Home.  DOA: 04/19/2020 LOS: 1  Brief Narrative / Interim history: 69 year old female with history of DM-2, HTN, GERD and morbid obesity presenting from infusion clinic with hypoxemia to 84% and shortness of breath.  Patient had her first Moderna vaccine on 8/23.  Developed Covid-like symptoms on 8/27 and tested positive for COVID-19 on 8/28.  Presented to [facility] on 9/3 with SOB, cough, substernal chest pain and generalized weakness.  CXR compatible with multifocal pneumonia in the setting of COVID-19.  Reportedly maintained appropriate saturation with ambulation and discharged home.  She was directed to infusion clinic for monoclonal antibody.   Husband with COVID-19 infection at home.  In ED, confused.  CBG 59.  ABG without significant finding.  Saturating at 81% on RA requiring 6 L.  K3.0.  CRP 19.4.  Other inflammatory markers elevated.  Lactic acid 2.3>> 1.9.  Pro-Cal negative.  CXR consistent with multifocal pneumonia.  Received Decadron and remdesivir in ED.  Admitted for acute hypoxemic respiratory failure due to COVID-19 infection, acute metabolic encephalopathy and hypoglycemia.  She was a started on baricitinib, Solu-Medrol and remdesivir.   Subjective: Seen and examined earlier this morning.  Saline with the edge of the bed tolerated breakfast.  Awake but not quite alert.  Reports myalgia, cough, sore throat, shortness of breath and chest pain with cough.  She denies nausea, vomiting or UTI symptoms but only oriented to self and partial place.  She thinks she is in ICU.  Saturating 92 to 96% on 8 L.  Objective: Vitals:   04/20/20 1230 04/20/20 1245 04/20/20 1300 04/20/20 1315  BP:      Pulse: 64 69 74 69  Resp:    (!) 31  Temp:      TempSrc:      SpO2: 99% 92% (!) 80% 91%   No intake or output data in the 24 hours ending 04/20/20 1503 There were no vitals filed for this  visit.  Examination:  GENERAL: No apparent distress.  Nontoxic. HEENT: MMM.  Vision and hearing grossly intact.  NECK: Supple.  No apparent JVD.  RESP: 92 to 96% on 8 L.  No IWOB.  Fair aeration bilaterally. CVS:  RRR. Heart sounds normal.  ABD/GI/GU: BS+. Abd soft, NTND.  MSK/EXT:  Moves extremities. No apparent deformity. No edema.  SKIN: no apparent skin lesion or wound NEURO: Awake but not quite alert.  Oriented to self and partial place but she thinks she is in ICU.  No apparent focal neuro deficit. PSYCH: Calm. Normal affect.   Procedures:  None  Microbiology summarized: COVID-19 PCR positive.  Assessment & Plan: Acute hypoxemic respiratory failure due to COVID-19 pneumonia/infection-progressive symptoms since 8/27.  Tested positive on 8/28.  Desaturated as low as 81% on RA.  CXR with bilateral infiltrates.  Inflammatory markers elevated.  Currently requiring 8 L to maintain appropriate saturation. Recent Labs    04/19/20 1223 04/20/20 0605  DDIMER 1.01* 1.14*  FERRITIN 670* 670*  LDH 460*  --   CRP 19.4* 15.1*  -Continue baricitinib, Solu-Medrol and remdesivir 9/5>>> increase Solu-Medrol to 60 mg twice daily -Continue supportive care with inhalers, mucolytic's, antitussive, vitamin C/zinc, incentive spirometry -OOB/PT/OT -Continue monitoring inflammatory markers  Acute metabolic encephalopathy due to COVID-19 infection and possibly hypoglycemia-CBG was 51 on arrival.  Encephalopathy seems to have improved.  Now awake but not quite alert.  Oriented to self.  She  thinks she is in ICU.  No apparent focal neuro deficit.  ABG without significant finding. -Treat COVID-19 infection and hypoglycemia -Reorientation and delirium precautions.  Uncontrolled DM-2 with hyperglycemia and hypoglycemia-on insulin, Jardiance, Trulicity and Metformin at home.  No recent A1c in the chart. Recent Labs  Lab 04/19/20 1304 04/19/20 1534 04/19/20 2120  GLUCAP 59* 89 278*  -Check  hemoglobin A1c -Continue SSI-moderate -Continue Levemir 14 units twice daily -Add NovoLog AC 6 units 3 times AC -Continue Tradjenta and statin  Hypokalemia: Resolved.  Essential hypertension: Normotensive. -Continue home metoprolol 25 mg twice daily  GERD-does not seem to be on meds at home. -Start Protonix while on steroid  Morbid obesity: BMI 37.73 in patient with uncontrolled diabetes. -Encourage lifestyle change to lose weight    DVT prophylaxis:  enoxaparin (LOVENOX) injection 40 mg Start: 04/19/20 2200  Code Status: Full code Family Communication: Updated patient's husband over the phone Status is: Inpatient  Remains inpatient appropriate because:IV treatments appropriate due to intensity of illness or inability to take PO and Inpatient level of care appropriate due to severity of illness   Dispo: The patient is from: Home              Anticipated d/c is to: Home              Anticipated d/c date is: > 3 days              Patient currently is not medically stable to d/c.       Consultants:  None   Sch Meds:  Scheduled Meds: . albuterol  2 puff Inhalation Q6H  . baricitinib  4 mg Oral Daily  . enoxaparin (LOVENOX) injection  40 mg Subcutaneous Q24H  . insulin aspart  0-15 Units Subcutaneous TID WC  . insulin aspart  0-5 Units Subcutaneous QHS  . insulin aspart  6 Units Subcutaneous TID WC  . insulin detemir  0.15 Units/kg Subcutaneous BID  . linagliptin  5 mg Oral Daily  . methylPREDNISolone (SOLU-MEDROL) injection  0.5 mg/kg Intravenous Q12H   Followed by  . [START ON 04/22/2020] predniSONE  50 mg Oral Daily  . metoprolol tartrate  25 mg Oral BID  . pantoprazole  40 mg Oral Daily   Continuous Infusions: . remdesivir 100 mg in NS 100 mL 100 mg (04/20/20 0956)   PRN Meds:.acetaminophen, guaiFENesin-dextromethorphan  Antimicrobials: Anti-infectives (From admission, onward)   Start     Dose/Rate Route Frequency Ordered Stop   04/20/20 1000  remdesivir  100 mg in sodium chloride 0.9 % 100 mL IVPB       "Followed by" Linked Group Details   100 mg 200 mL/hr over 30 Minutes Intravenous Daily 04/19/20 1601 04/24/20 0959   04/20/20 1000  remdesivir 100 mg in sodium chloride 0.9 % 100 mL IVPB  Status:  Discontinued       "Followed by" Linked Group Details   100 mg 200 mL/hr over 30 Minutes Intravenous Daily 04/19/20 1915 04/19/20 1919   04/19/20 1915  remdesivir 200 mg in sodium chloride 0.9% 250 mL IVPB  Status:  Discontinued       "Followed by" Linked Group Details   200 mg 580 mL/hr over 30 Minutes Intravenous Once 04/19/20 1915 04/19/20 1919   04/19/20 1700  remdesivir 200 mg in sodium chloride 0.9% 250 mL IVPB       "Followed by" Linked Group Details   200 mg 580 mL/hr over 30 Minutes Intravenous Once 04/19/20 1601 04/19/20 1814  I have personally reviewed the following labs and images: CBC: Recent Labs  Lab 04/17/20 1829 04/19/20 1223 04/20/20 0605  WBC 7.2 9.1 5.2  NEUTROABS 6.5 7.9* 4.2  HGB 15.5* 15.3* 14.9  HCT 46.2* 46.7* 45.9  MCV 81.6 82.7 83.0  PLT 183 242 230   BMP &GFR Recent Labs  Lab 04/17/20 1829 04/19/20 1223 04/20/20 0605  NA 139 145 140  K 3.5 3.0* 3.8  CL 103 107 104  CO2 22 25 23   GLUCOSE 244* 61* 237*  BUN 20 16 21   CREATININE 0.97 0.87 0.76  CALCIUM 9.0 9.3 9.1  MG  --   --  2.4   Estimated Creatinine Clearance: 74.5 mL/min (by C-G formula based on SCr of 0.76 mg/dL). Liver & Pancreas: Recent Labs  Lab 04/17/20 1829 04/19/20 1223 04/20/20 0605  AST 48* 40 35  ALT 37 28 27  ALKPHOS 46 45 42  BILITOT 0.8 0.7 0.8  PROT 8.0 7.9 7.0  ALBUMIN 3.5 3.2* 2.8*   No results for input(s): LIPASE, AMYLASE in the last 168 hours. No results for input(s): AMMONIA in the last 168 hours. Diabetic: No results for input(s): HGBA1C in the last 72 hours. Recent Labs  Lab 04/19/20 1304 04/19/20 1534 04/19/20 2120  GLUCAP 59* 89 278*   Cardiac Enzymes: No results for input(s): CKTOTAL,  CKMB, CKMBINDEX, TROPONINI in the last 168 hours. No results for input(s): PROBNP in the last 8760 hours. Coagulation Profile: No results for input(s): INR, PROTIME in the last 168 hours. Thyroid Function Tests: No results for input(s): TSH, T4TOTAL, FREET4, T3FREE, THYROIDAB in the last 72 hours. Lipid Profile: Recent Labs    04/19/20 1223  TRIG 96   Anemia Panel: Recent Labs    04/19/20 1223 04/20/20 0605  FERRITIN 670* 670*   Urine analysis:    Component Value Date/Time   COLORURINE Yellow 11/11/2014 1041   APPEARANCEUR Hazy 11/11/2014 1041   LABSPEC 1.023 11/11/2014 1041   PHURINE 5.0 11/11/2014 1041   GLUCOSEU >=500 11/11/2014 1041   HGBUR Negative 11/11/2014 1041   BILIRUBINUR Negative 11/11/2014 1041   KETONESUR 1+ 11/11/2014 1041   PROTEINUR 30 mg/dL 11/13/2014 11/13/2014   NITRITE Negative 11/11/2014 1041   LEUKOCYTESUR Negative 11/11/2014 1041   Sepsis Labs: Invalid input(s): PROCALCITONIN, LACTICIDVEN  Microbiology: Recent Results (from the past 240 hour(s))  Blood Culture (routine x 2)     Status: None (Preliminary result)   Collection Time: 04/19/20 12:23 PM   Specimen: BLOOD  Result Value Ref Range Status   Specimen Description   Final    BLOOD LEFT ANTECUBITAL Performed at [facility], [address] [place], [place]    Special Requests   Final    BOTTLES DRAWN AEROBIC AND ANAEROBIC Blood Culture adequate volume Performed at [facility], [address] [place], [place]    Culture   Final    NO GROWTH < 24 HOURS Performed at [facility], [address] [place], [address] [place]    Report Status PENDING  Incomplete  Blood Culture (routine x 2)     Status: None (Preliminary result)   Collection Time: 04/19/20 12:28 PM   Specimen: BLOOD  Result Value Ref Range Status   Specimen Description   Final    BLOOD RIGHT ANTECUBITAL Performed at [facility], [address], [place], [place]    Special Requests   Final    BOTTLES DRAWN AEROBIC  AND ANAEROBIC Blood Culture adequate volume Performed at [facility], [address] [place], [place] [address]    Culture   Final    NO GROWTH < 24 HOURS Performed at [facility], [address] [place], [place] [address]    Report Status PENDING  Incomplete    Radiology Studies: No results found.    Warnell Rasnic T. Micky Overturf [facility]  If 7PM-7AM, please contact night-coverage [email or web address] 04/20/2020, 3:03 PM

## 2020-04-20 NOTE — ED Notes (Signed)
Requested urine from patient. 

## 2020-04-21 ENCOUNTER — Ambulatory Visit: Admit: 2020-04-21 | Payer: Managed Care, Other (non HMO) | Admitting: Ophthalmology

## 2020-04-21 DIAGNOSIS — E1165 Type 2 diabetes mellitus with hyperglycemia: Secondary | ICD-10-CM

## 2020-04-21 DIAGNOSIS — E870 Hyperosmolality and hypernatremia: Secondary | ICD-10-CM

## 2020-04-21 LAB — COMPREHENSIVE METABOLIC PANEL
ALT: 25 U/L (ref 0–44)
AST: 28 U/L (ref 15–41)
Albumin: 3.1 g/dL — ABNORMAL LOW (ref 3.5–5.0)
Alkaline Phosphatase: 45 U/L (ref 38–126)
Anion gap: 17 — ABNORMAL HIGH (ref 5–15)
BUN: 29 mg/dL — ABNORMAL HIGH (ref 8–23)
CO2: 23 mmol/L (ref 22–32)
Calcium: 9.6 mg/dL (ref 8.9–10.3)
Chloride: 107 mmol/L (ref 98–111)
Creatinine, Ser: 0.92 mg/dL (ref 0.44–1.00)
GFR calc Af Amer: >60 mL/min (ref >60)
GFR calc non Af Amer: >60 mL/min (ref >60)
Glucose, Bld: 284 mg/dL — ABNORMAL HIGH (ref 70–99)
Potassium: 4 mmol/L (ref 3.5–5.1)
Sodium: 147 mmol/L — ABNORMAL HIGH (ref 135–145)
Total Bilirubin: 0.8 mg/dL (ref 0.3–1.2)
Total Protein: 7.5 g/dL (ref 6.5–8.1)

## 2020-04-21 LAB — CBC WITH DIFFERENTIAL/PLATELET
Abs Immature Granulocytes: 0.07 10*3/uL (ref 0.00–0.07)
Basophils Absolute: 0 10*3/uL (ref 0.0–0.1)
Basophils Relative: 0 %
Eosinophils Absolute: 0 10*3/uL (ref 0.0–0.5)
Eosinophils Relative: 0 %
HCT: 45.3 % (ref 36.0–46.0)
Hemoglobin: 14.8 g/dL (ref 12.0–15.0)
Immature Granulocytes: 1 %
Lymphocytes Relative: 10 %
Lymphs Abs: 0.7 10*3/uL (ref 0.7–4.0)
MCH: 27.1 pg (ref 26.0–34.0)
MCHC: 32.7 g/dL (ref 30.0–36.0)
MCV: 82.8 fL (ref 80.0–100.0)
Monocytes Absolute: 0.1 10*3/uL (ref 0.1–1.0)
Monocytes Relative: 2 %
Neutro Abs: 6.6 10*3/uL (ref 1.7–7.7)
Neutrophils Relative %: 87 %
Platelets: 238 10*3/uL (ref 150–400)
RBC: 5.47 MIL/uL — ABNORMAL HIGH (ref 3.87–5.11)
RDW: 13.1 % (ref 11.5–15.5)
WBC: 7.5 10*3/uL (ref 4.0–10.5)
nRBC: 0 % (ref 0.0–0.2)

## 2020-04-21 LAB — GLUCOSE, CAPILLARY: Glucose-Capillary: 89 mg/dL (ref 70–99)

## 2020-04-21 LAB — CBG MONITORING, ED
Glucose-Capillary: 204 mg/dL — ABNORMAL HIGH (ref 70–99)
Glucose-Capillary: 244 mg/dL — ABNORMAL HIGH (ref 70–99)
Glucose-Capillary: 241 mg/dL — ABNORMAL HIGH (ref 70–99)
Glucose-Capillary: 191 mg/dL — ABNORMAL HIGH (ref 70–99)
Glucose-Capillary: 132 mg/dL — ABNORMAL HIGH (ref 70–99)
Glucose-Capillary: 213 mg/dL — ABNORMAL HIGH (ref 70–99)
Glucose-Capillary: 313 mg/dL — ABNORMAL HIGH (ref 70–99)
Glucose-Capillary: 195 mg/dL — ABNORMAL HIGH (ref 70–99)

## 2020-04-21 LAB — D-DIMER, QUANTITATIVE: D-Dimer, Quant: 1.63 ug/mL-FEU — ABNORMAL HIGH (ref 0.00–0.50)

## 2020-04-21 LAB — MAGNESIUM: Magnesium: 2.6 mg/dL — ABNORMAL HIGH (ref 1.7–2.4)

## 2020-04-21 LAB — C-REACTIVE PROTEIN: CRP: 7.9 mg/dL — ABNORMAL HIGH (ref <1.0)

## 2020-04-21 LAB — HEMOGLOBIN A1C
Hgb A1c MFr Bld: 7.2 % — ABNORMAL HIGH (ref 4.8–5.6)
Mean Plasma Glucose: 159.94 mg/dL

## 2020-04-21 LAB — FERRITIN: Ferritin: 975 ng/mL — ABNORMAL HIGH (ref 11–307)

## 2020-04-21 SURGERY — PHACOEMULSIFICATION, CATARACT, WITH IOL INSERTION
Anesthesia: Topical | Laterality: Left

## 2020-04-21 MED ORDER — INSULIN ASPART 100 UNIT/ML ~~LOC~~ SOLN
8.0000 [IU] | Freq: Three times a day (TID) | SUBCUTANEOUS | Status: DC
  Administered 2020-04-22: 8 [IU] via SUBCUTANEOUS
  Filled 2020-04-21: qty 0.08

## 2020-04-21 MED ORDER — SODIUM CHLORIDE 0.9 % IV SOLN
1200.0000 mg | Freq: Once | INTRAVENOUS | Status: DC
  Filled 2020-04-21: qty 10

## 2020-04-21 MED ORDER — ALBUTEROL SULFATE HFA 108 (90 BASE) MCG/ACT IN AERS
2.0000 | INHALATION_SPRAY | Freq: Four times a day (QID) | RESPIRATORY_TRACT | Status: DC
  Administered 2020-04-21 – 2020-04-26 (×12): 2 via RESPIRATORY_TRACT
  Filled 2020-04-21 (×2): qty 6.7

## 2020-04-21 MED ORDER — SODIUM CHLORIDE 0.45 % IV SOLN: INTRAVENOUS | Status: DC

## 2020-04-21 MED ORDER — DIPHENHYDRAMINE HCL 50 MG/ML IJ SOLN: 50.0000 mg | Freq: Once | INTRAMUSCULAR | Status: DC | PRN

## 2020-04-21 MED ORDER — INSULIN DETEMIR 100 UNIT/ML ~~LOC~~ SOLN
20.0000 [IU] | Freq: Two times a day (BID) | SUBCUTANEOUS | Status: DC
  Administered 2020-04-21 – 2020-04-24 (×6): 20 [IU] via SUBCUTANEOUS
  Filled 2020-04-21 (×6): qty 0.2

## 2020-04-21 MED ORDER — EPINEPHRINE 0.3 MG/0.3ML IJ SOAJ: 0.3000 mg | Freq: Once | INTRAMUSCULAR | Status: DC | PRN

## 2020-04-21 MED ORDER — METHYLPREDNISOLONE SODIUM SUCC 125 MG IJ SOLR: 125.0000 mg | Freq: Once | INTRAMUSCULAR | Status: DC | PRN

## 2020-04-21 MED ORDER — FAMOTIDINE IN NACL 20-0.9 MG/50ML-% IV SOLN: 20.0000 mg | Freq: Once | INTRAVENOUS | Status: DC | PRN

## 2020-04-21 MED ORDER — ALBUTEROL SULFATE HFA 108 (90 BASE) MCG/ACT IN AERS: 2.0000 | INHALATION_SPRAY | Freq: Once | RESPIRATORY_TRACT | Status: DC | PRN

## 2020-04-21 MED ORDER — SODIUM CHLORIDE 0.9 % IV SOLN: INTRAVENOUS | Status: DC | PRN

## 2020-04-21 NOTE — Progress Notes (Signed)
PROGRESS NOTE  Charlene FearingGlenda H Bessent JXB:[id number] DOB: 08/23/1950   PCP: Jaclyn Shaggyate, Denny C, MD  Patient is from: Home.  DOA: 04/19/2020 LOS: 2  Brief Narrative / Interim history: 69 year old female with history of DM-2, HTN, GERD and morbid obesity presenting from infusion clinic with hypoxemia to 84% and shortness of breath.  Patient had her first Moderna vaccine on 8/23.  Developed Covid-like symptoms on 8/27 and tested positive for COVID-19 on 8/28.  [facility] with SOB, cough, substernal chest pain and generalized weakness.  CXR compatible with multifocal pneumonia in the setting of COVID-19.  Reportedly maintained appropriate saturation with ambulation and discharged home.  She was directed to infusion clinic for monoclonal antibody.   Husband with COVID-19 infection at home.  In ED, confused.  CBG 59.  ABG without significant finding.  Saturating at 81% on RA requiring 6 L.  K3.0.  CRP 19.4.  Other inflammatory markers elevated.  Lactic acid 2.3>> 1.9.  Pro-Cal negative.  CXR consistent with multifocal pneumonia.  Received Decadron and remdesivir in ED.  Admitted for acute hypoxemic respiratory failure due to COVID-19 infection, acute metabolic encephalopathy and hypoglycemia.  She was a started on baricitinib, Solu-Medrol and remdesivir.  She is now requiring 15 L by HFNC/NRB.  She is also in soft four-point restraints as she frequently remove the O2 device and desaturates.   Subjective: Seen and examined earlier this morning.  She took all of the NRB and  and desaturated to 80%.  She was a started on soft restraints.  She is now on 15 L by HFNC/NRB saturating in upper 80s to low 90s.  She is awake but only oriented to self.  Still with shortness of breath.  She says she is uncomfortable with NRB/HFNC.  No apparent focal neuro deficit.   Objective: Vitals:   04/21/20 1130 04/21/20 1200 04/21/20 1230 04/21/20 1300  BP: (!) 157/102 140/81 (!) 153/81 (!) 141/80  Pulse: 70 61 71 70    Resp: 19 19 (!) 26 (!) 28  Temp:      TempSrc:      SpO2: (!) 89% 99% 90% 91%   No intake or output data in the 24 hours ending 04/21/20 1419 There were no vitals filed for this visit.  Examination:  GENERAL: No apparent distress.  Nontoxic. HEENT: MMM.  Vision and hearing grossly intact.  NECK: Supple.  No apparent JVD.  RESP: 87 to 91% on 15 L HFNC/NRB.  No IWOB.  Bilateral fine crackles. CVS:  RRR. Heart sounds normal.  ABD/GI/GU: BS+. Abd soft, NTND.  MSK/EXT:  Moves extremities.  No edema.  Four-point soft restraints SKIN: no apparent skin lesion or wound NEURO: Awake but only oriented to self.  No apparent focal neuro deficit. PSYCH: Calm but disoriented.  Trying to pull off NRB.   Procedures:  None  Microbiology summarized: COVID-19 PCR positive.  Assessment & Plan: Acute hypoxemic respiratory failure due to COVID-19 pneumonia/infection-progressive symptoms since 8/27.  Tested positive on 8/28.  Desaturated as low as 81% on RA.  CXR with bilateral infiltrates.  Inflammatory markers elevated but downtrending.  Now on 15 L by HFNC/NRB Recent Labs    04/19/20 1223 04/20/20 0605 04/21/20 0450  DDIMER 1.01* 1.14* 1.63*  FERRITIN 670* 670* 975*  LDH 460*  --   --   CRP 19.4* 15.1* 7.9*  -Continue baricitinib, Solu-Medrol and remdesivir 9/5>>> increased Solu-Medrol to 60 mg twice daily on 9/6 -Continue supportive care with inhalers, mucolytic's, antitussive, vitamin  C/zinc, incentive spirometry -Continue bilateral wrist restraints as she tries to pull off HFNC/NRB and desaturates -Continue monitoring inflammatory markers -PT/OT eval -Escalate care to stepdown unit given increased oxygen requirement and persistent encephalopathy  Acute metabolic encephalopathy due to COVID-19 infection: Now disoriented and trying to pull off her NRB.  Only oriented to self.  No apparent focal neuro deficit.  ABG without significant finding. -Treat COVID-19 infection and  hypoglycemia -Reorientation and delirium precautions. -Continue bilateral wrist restraints -Discontinue ankle restraints  Uncontrolled DM-2 with hyperglycemia and hypoglycemia-on insulin, Jardiance, Trulicity and Metformin at home.  A1c 7.2%.  Hyperglycemia likely due to steroids. Recent Labs  Lab 04/20/20 1622 04/20/20 2101 04/21/20 0219 04/21/20 0755 04/21/20 1142  GLUCAP 191* 132* 213* 313* 195*  -Continue SSI-moderate -Increase Levemir from 14 to 20 units twice daily -Increase NovoLog from 6 to 8 units AC -Continue Tradjenta and statin  Hypokalemia: Resolved.  Mild hypernatremia: Na 147.  Likely from dehydration. -0.45% NaCl at 75 cc an hour  Essential hypertension: Normotensive. -Continue home metoprolol 25 mg twice daily  GERD-does not seem to be on meds at home. -Start Protonix while on steroid  Morbid obesity: BMI 37.73 in patient with uncontrolled diabetes. -Encourage lifestyle change to lose weight    DVT prophylaxis:  enoxaparin (LOVENOX) injection 40 mg Start: 04/19/20 2200  Code Status: Full code Family Communication: Left voicemail for patient's husband Status is: Inpatient  Remains inpatient appropriate because:Altered mental status, IV treatments appropriate due to intensity of illness or inability to take PO and Inpatient level of care appropriate due to severity of illness.  Escalating care to stepdown unit given increased oxygen requirement and persistent encephalopathy.   Dispo: The patient is from: Home              Anticipated d/c is to: Home              Anticipated d/c date is: > 3 days              Patient currently is not medically stable to d/c.       Consultants:  None   Sch Meds:  Scheduled Meds: . albuterol  2 puff Inhalation Q6H WA  . baricitinib  4 mg Oral Daily  . enoxaparin (LOVENOX) injection  40 mg Subcutaneous Q24H  . insulin aspart  0-15 Units Subcutaneous TID WC  . insulin aspart  0-5 Units Subcutaneous QHS  .  insulin aspart  8 Units Subcutaneous TID WC  . insulin detemir  20 Units Subcutaneous BID  . linagliptin  5 mg Oral Daily  . methylPREDNISolone (SOLU-MEDROL) injection  60 mg Intravenous Q12H  . metoprolol tartrate  25 mg Oral BID  . pantoprazole  40 mg Oral Daily   Continuous Infusions: . sodium chloride 75 mL/hr at 04/21/20 1240  . remdesivir 100 mg in NS 100 mL 100 mg (04/21/20 1240)   PRN Meds:.acetaminophen, guaiFENesin-dextromethorphan  Antimicrobials: Anti-infectives (From admission, onward)   Start     Dose/Rate Route Frequency Ordered Stop   04/20/20 1000  remdesivir 100 mg in sodium chloride 0.9 % 100 mL IVPB       "Followed by" Linked Group Details   100 mg 200 mL/hr over 30 Minutes Intravenous Daily 04/19/20 1601 04/24/20 0959   04/20/20 1000  remdesivir 100 mg in sodium chloride 0.9 % 100 mL IVPB  Status:  Discontinued       "Followed by" Linked Group Details   100 mg 200 mL/hr over 30 Minutes Intravenous  Daily 04/19/20 1915 04/19/20 1919   04/19/20 1915  remdesivir 200 mg in sodium chloride 0.9% 250 mL IVPB  Status:  Discontinued       "Followed by" Linked Group Details   200 mg 580 mL/hr over 30 Minutes Intravenous Once 04/19/20 1915 04/19/20 1919   04/19/20 1700  remdesivir 200 mg in sodium chloride 0.9% 250 mL IVPB       "Followed by" Linked Group Details   200 mg 580 mL/hr over 30 Minutes Intravenous Once 04/19/20 1601 04/19/20 1814       I have personally reviewed the following labs and images: CBC: Recent Labs  Lab 04/17/20 1829 04/19/20 1223 04/20/20 0605 04/21/20 0450  WBC 7.2 9.1 5.2 7.5  NEUTROABS 6.5 7.9* 4.2 6.6  HGB 15.5* 15.3* 14.9 14.8  HCT 46.2* 46.7* 45.9 45.3  MCV 81.6 82.7 83.0 82.8  PLT 183 242 230 238   BMP &GFR Recent Labs  Lab 04/17/20 1829 04/19/20 1223 04/20/20 0605 04/21/20 0450  NA 139 145 140 147*  K 3.5 3.0* 3.8 4.0  CL 103 107 104 107  CO2 22 25 23 23   GLUCOSE 244* 61* 237* 284*  BUN 20 16 21  29*  CREATININE  0.97 0.87 0.76 0.92  CALCIUM 9.0 9.3 9.1 9.6  MG  --   --  2.4 2.6*   Estimated Creatinine Clearance: 64.8 mL/min (by C-G formula based on SCr of 0.92 mg/dL). Liver & Pancreas: Recent Labs  Lab 04/17/20 1829 04/19/20 1223 04/20/20 0605 04/21/20 0450  AST 48* 40 35 28  ALT 37 28 27 25   ALKPHOS 46 45 42 45  BILITOT 0.8 0.7 0.8 0.8  PROT 8.0 7.9 7.0 7.5  ALBUMIN 3.5 3.2* 2.8* 3.1*   No results for input(s): LIPASE, AMYLASE in the last 168 hours. No results for input(s): AMMONIA in the last 168 hours. Diabetic: Recent Labs    04/21/20 0450  HGBA1C 7.2*   Recent Labs  Lab 04/20/20 1622 04/20/20 2101 04/21/20 0219 04/21/20 0755 04/21/20 1142  GLUCAP 191* 132* 213* 313* 195*   Cardiac Enzymes: No results for input(s): CKTOTAL, CKMB, CKMBINDEX, TROPONINI in the last 168 hours. No results for input(s): PROBNP in the last 8760 hours. Coagulation Profile: No results for input(s): INR, PROTIME in the last 168 hours. Thyroid Function Tests: No results for input(s): TSH, T4TOTAL, FREET4, T3FREE, THYROIDAB in the last 72 hours. Lipid Profile: Recent Labs    04/19/20 1223  TRIG 96   Anemia Panel: Recent Labs    04/20/20 0605 04/21/20 0450  FERRITIN 670* 975*   Urine analysis:    Component Value Date/Time   COLORURINE Yellow 11/11/2014 1041   APPEARANCEUR Hazy 11/11/2014 1041   LABSPEC 1.023 11/11/2014 1041   PHURINE 5.0 11/11/2014 1041   GLUCOSEU >=500 11/11/2014 1041   HGBUR Negative 11/11/2014 1041   BILIRUBINUR Negative 11/11/2014 1041   KETONESUR 1+ 11/11/2014 1041   PROTEINUR 30 mg/dL 11/13/2014 11/13/2014   NITRITE Negative 11/11/2014 1041   LEUKOCYTESUR Negative 11/11/2014 1041   Sepsis Labs: Invalid input(s): PROCALCITONIN, LACTICIDVEN  Microbiology: Recent Results (from the past 240 hour(s))  Blood Culture (routine x 2)     Status: None (Preliminary result)   Collection Time: 04/19/20 12:23 PM   Specimen: BLOOD  Result Value Ref Range Status    Specimen Description   Final    BLOOD LEFT ANTECUBITAL Performed at [facility], [address] [place], [place]    Special Requests   Final  BOTTLES DRAWN AEROBIC AND ANAEROBIC Blood Culture adequate volume Performed at [facility], [address] [place], [place] [address]    Culture   Final    NO GROWTH 2 DAYS Performed at [facility], [address], [place], [place] [address]    Report Status PENDING  Incomplete  Blood Culture (routine x 2)     Status: None (Preliminary result)   Collection Time: 04/19/20 12:28 PM   Specimen: BLOOD  Result Value Ref Range Status   Specimen Description   Final    BLOOD RIGHT ANTECUBITAL Performed at [facility], [address] [place], [place] [address]    Special Requests   Final    BOTTLES DRAWN AEROBIC AND ANAEROBIC Blood Culture adequate volume Performed at [facility], [address] [place], [place] [address]    Culture   Final    NO GROWTH 2 DAYS Performed at [facility], [address], [place], [place] [address]    Report Status PENDING  Incomplete    Radiology Studies: No results found.    Azya Barbero T. Jasper Hanf [facility]  If 7PM-7AM, please contact night-coverage [email or web address] 04/21/2020, 2:19 PM

## 2020-04-21 NOTE — ED Notes (Addendum)
Pt sitting at side at end of bed pt assisted back to bed sitting upright . Pt o2 saturation decreased to 82 unable to achieve o2 greater than 85% pt placed on non-rebreather at 15L  in addition to high-flow nasal canula. Respiratory called to bedside. Respiratory therapist stated to leave pt on non-rebreather and humidified highflow both at 15L. Pt current 02 saturation is 97%

## 2020-04-21 NOTE — ED Notes (Signed)
CBG obtain to rule out hypoglycemia due to AMS.

## 2020-04-21 NOTE — ED Notes (Signed)
Please call husband for update [phone]

## 2020-04-21 NOTE — ED Notes (Signed)
Pt took of oxygen therapy o2 saturation decreased to 80% upon assessment high flow nasal canula was leaking. Replace high flow nasal canula with new and reapplied both non-rebreather and high flow at 15 L. Called respriortory explaining. Respiratory advised to try to wean of non-rebreather in appox an hour.

## 2020-04-21 NOTE — ED Notes (Signed)
Pt sitting up in bed, leaning over attempting to grasp/remove NR mask and cannula, attempting to reach for monitor lines, agitated and yelling to take them off.

## 2020-04-21 NOTE — ED Notes (Signed)
Pt CBG 313, will cover with 11 units Novolog per protocal

## 2020-04-21 NOTE — ED Notes (Signed)
Pt CBG 175 covered 3 units Novolog

## 2020-04-21 NOTE — ED Notes (Addendum)
Pt alert to self and Birthday. With assistance able to identify she was in a hospital However, stated she was in [place]. Pt states she is tired and denies pain at this time.

## 2020-04-22 LAB — CBC WITH DIFFERENTIAL/PLATELET
Abs Immature Granulocytes: 0.14 10*3/uL — ABNORMAL HIGH (ref 0.00–0.07)
Basophils Absolute: 0 10*3/uL (ref 0.0–0.1)
Basophils Relative: 0 %
Eosinophils Absolute: 0 10*3/uL (ref 0.0–0.5)
Eosinophils Relative: 0 %
HCT: 47.2 % — ABNORMAL HIGH (ref 36.0–46.0)
Hemoglobin: 15.4 g/dL — ABNORMAL HIGH (ref 12.0–15.0)
Immature Granulocytes: 1 %
Lymphocytes Relative: 6 %
Lymphs Abs: 0.8 10*3/uL (ref 0.7–4.0)
MCH: 27.2 pg (ref 26.0–34.0)
MCHC: 32.6 g/dL (ref 30.0–36.0)
MCV: 83.4 fL (ref 80.0–100.0)
Monocytes Absolute: 0.5 10*3/uL (ref 0.1–1.0)
Monocytes Relative: 4 %
Neutro Abs: 11 10*3/uL — ABNORMAL HIGH (ref 1.7–7.7)
Neutrophils Relative %: 89 %
Platelets: 291 10*3/uL (ref 150–400)
RBC: 5.66 MIL/uL — ABNORMAL HIGH (ref 3.87–5.11)
RDW: 13.2 % (ref 11.5–15.5)
WBC: 12.5 10*3/uL — ABNORMAL HIGH (ref 4.0–10.5)
nRBC: 0 % (ref 0.0–0.2)

## 2020-04-22 LAB — URINALYSIS, ROUTINE W REFLEX MICROSCOPIC
Bilirubin Urine: NEGATIVE
Glucose, UA: 50 mg/dL — AB
Hgb urine dipstick: NEGATIVE
Ketones, ur: 5 mg/dL — AB
Leukocytes,Ua: NEGATIVE
Nitrite: NEGATIVE
Protein, ur: 30 mg/dL — AB
Specific Gravity, Urine: 1.032 — ABNORMAL HIGH (ref 1.005–1.030)
pH: 5 (ref 5.0–8.0)

## 2020-04-22 LAB — COMPREHENSIVE METABOLIC PANEL
ALT: 25 U/L (ref 0–44)
AST: 24 U/L (ref 15–41)
Albumin: 3.2 g/dL — ABNORMAL LOW (ref 3.5–5.0)
Alkaline Phosphatase: 45 U/L (ref 38–126)
Anion gap: 20 — ABNORMAL HIGH (ref 5–15)
BUN: 30 mg/dL — ABNORMAL HIGH (ref 8–23)
CO2: 20 mmol/L — ABNORMAL LOW (ref 22–32)
Calcium: 9.9 mg/dL (ref 8.9–10.3)
Chloride: 111 mmol/L (ref 98–111)
Creatinine, Ser: 0.87 mg/dL (ref 0.44–1.00)
GFR calc Af Amer: >60 mL/min (ref >60)
GFR calc non Af Amer: >60 mL/min (ref >60)
Glucose, Bld: 268 mg/dL — ABNORMAL HIGH (ref 70–99)
Potassium: 4.2 mmol/L (ref 3.5–5.1)
Sodium: 151 mmol/L — ABNORMAL HIGH (ref 135–145)
Total Bilirubin: 0.7 mg/dL (ref 0.3–1.2)
Total Protein: 7.3 g/dL (ref 6.5–8.1)

## 2020-04-22 LAB — CBG MONITORING, ED
Glucose-Capillary: 175 mg/dL — ABNORMAL HIGH (ref 70–99)
Glucose-Capillary: 194 mg/dL — ABNORMAL HIGH (ref 70–99)
Glucose-Capillary: 249 mg/dL — ABNORMAL HIGH (ref 70–99)
Glucose-Capillary: 289 mg/dL — ABNORMAL HIGH (ref 70–99)
Glucose-Capillary: 193 mg/dL — ABNORMAL HIGH (ref 70–99)

## 2020-04-22 LAB — FERRITIN: Ferritin: 1035 ng/mL — ABNORMAL HIGH (ref 11–307)

## 2020-04-22 LAB — C-REACTIVE PROTEIN: CRP: 2.5 mg/dL — ABNORMAL HIGH (ref <1.0)

## 2020-04-22 LAB — MAGNESIUM: Magnesium: 2.7 mg/dL — ABNORMAL HIGH (ref 1.7–2.4)

## 2020-04-22 LAB — D-DIMER, QUANTITATIVE: D-Dimer, Quant: 0.7 ug/mL-FEU — ABNORMAL HIGH (ref 0.00–0.50)

## 2020-04-22 MED ORDER — INSULIN ASPART 100 UNIT/ML ~~LOC~~ SOLN
10.0000 [IU] | Freq: Three times a day (TID) | SUBCUTANEOUS | Status: DC
  Administered 2020-04-22 – 2020-04-24 (×4): 10 [IU] via SUBCUTANEOUS
  Filled 2020-04-22: qty 0.1

## 2020-04-22 MED ORDER — DEXTROSE 5 % IV SOLN: INTRAVENOUS | Status: DC

## 2020-04-22 MED ORDER — HALOPERIDOL LACTATE 5 MG/ML IJ SOLN
2.0000 mg | Freq: Four times a day (QID) | INTRAMUSCULAR | Status: DC | PRN
  Administered 2020-04-22 – 2020-05-02 (×14): 2 mg via INTRAVENOUS
  Filled 2020-04-22 (×15): qty 1

## 2020-04-22 MED ORDER — LORAZEPAM 2 MG/ML IJ SOLN
0.5000 mg | Freq: Four times a day (QID) | INTRAMUSCULAR | Status: DC | PRN
  Administered 2020-04-22: 0.5 mg via INTRAVENOUS
  Filled 2020-04-22: qty 1

## 2020-04-22 MED ORDER — LORAZEPAM 2 MG/ML IJ SOLN
1.0000 mg | Freq: Once | INTRAMUSCULAR | Status: AC
  Administered 2020-04-22: 1 mg via INTRAVENOUS
  Filled 2020-04-22: qty 1

## 2020-04-22 NOTE — ED Notes (Signed)
Multiple unsuccessful IV attempts by ER staff.  

## 2020-04-22 NOTE — Evaluation (Signed)
Physical Therapy Evaluation Patient Details Name: Charlene Parker MRN: [id number] DOB: 13-Jul-1951 Today's Date: 04/22/2020   History of Present Illness  Charlene Parker is a 69 y.o. female with PMHx HTN, GERD, Diabetes who presents to the ED today via EMS for SOB. Pt received her first dose of Moderna vaccine on Monday 08/23; she began developing COVID like symptoms on 08/27 then tested positive on 08/28. Pt went to [facility] ED on 09/03 for SOB, generalized weakness, substernal chest pain/epigastric pain, and cough. She was treated with LR after being found to be tachycardic. She was well appearing afterwards and was discharged home. Pt went to the Infusion Clinic 9/5 and found to be hypoxic at 84% on RA and confused. Pt transferred vis EMS to [facility].    Clinical Impression  Charlene Parker is 69 y.o. female admitted with above HPI and diagnosis. Patient is currently limited by functional impairments below (see PT problem list). Per chart review patient lives with her husband and has support/assist as needed, unclear what pt's baseline is as she was unable to provide history due to impaired cognition. Session very limited by poor ability to follow commands. She was able to follow simple repeated commands to scoot hips back in bed. Pt required mod assist to complete scooting backwards. She will benefit from continued skilled PT interventions to address impairments and progress independence with mobility, recommending SNF with 24/7 assist at this time. Acute PT will follow and progress as able.     Follow Up Recommendations SNF;Supervision/Assistance - 24 hour    Equipment Recommendations  Other (comment) (TBA)    Recommendations for Other Services       Precautions / Restrictions Precautions Precautions: Fall Restrictions Weight Bearing Restrictions: No      Mobility  Bed Mobility Overal bed mobility: Needs Assistance Bed Mobility: Rolling Rolling: Min guard;Min assist          General bed mobility comments: min guard/assist for safety. pt restless and squirmming in bed. pt rolled from spine to prone and required assist for line management/protection. in second portion of session pt had scooted to sit perpendicular on bed and required Max +2 assist to return to sit in safe position on bed. Pt using bil UE's on rails and pushign thorugh Bil LEs with bracing from PT to scoot hips back to raised [facility] for back support.   Transfers       Ambulation/Gait       Stairs       Wheelchair Mobility    Modified Rankin (Stroke Patients Only)             Pertinent Vitals/Pain Pain Assessment: Faces Faces Pain Scale: Hurts little more Pain Location: pt unable to verbalize/state when asked; stating "it hurts" Pain Descriptors / Indicators: Discomfort;Guarding;Grimacing Pain Intervention(s): Monitored during session;Repositioned    Home Living Family/patient expects to be discharged to:: Private residence Living Arrangements: Spouse/significant other               Additional Comments: pt unabel to provide any history or PLOF due to impaired congnition.    Prior Function           Comments: per chart review pt appears to be independent at home with her spouse available for support as needed. She was unable to provide history.     Hand Dominance        Extremity/Trunk Assessment   Upper Extremity Assessment Upper Extremity Assessment: Defer to OT evaluation    Lower  Extremity Assessment Lower Extremity Assessment: Difficult to assess due to impaired cognition    Cervical / Trunk Assessment Cervical / Trunk Assessment: Other exceptions Cervical / Trunk Exceptions: large habitus  Communication   Communication: Other (comment) (incoherrant vocalizations; min engagement in conversation)  Cognition Arousal/Alertness: Awake/alert (however maintained eyes closed)   Overall Cognitive Status: No family/caregiver present to determine baseline  cognitive functioning Area of Impairment: Orientation;Attention;Following commands;Safety/judgement;Awareness                 Orientation Level: Disoriented to;Place;Time;Situation Current Attention Level: Focused   Following Commands: Follows one step commands inconsistently Safety/Judgement: Decreased awareness of safety;Decreased awareness of deficits Awareness: Intellectual   General Comments: pt not able to follow commands for majority of sessions. seen in broken session (first portion with OT, second assisting RN. pt able to follow simple commands to use LE's and UE's to scoot up in bed)      General Comments      Exercises     Assessment/Plan    PT Assessment Patient needs continued PT services  PT Problem List Decreased activity tolerance;Decreased balance;Decreased mobility;Decreased coordination;Decreased cognition;Decreased safety awareness;Decreased knowledge of precautions;Cardiopulmonary status limiting activity;Obesity       PT Treatment Interventions DME instruction;Gait training;Functional mobility training;Therapeutic activities;Therapeutic exercise;Balance training;Cognitive remediation;Patient/family education    PT Goals (Current goals can be found in the Care Plan section)  Acute Rehab PT Goals Patient Stated Goal: none stated PT Goal Formulation: Patient unable to participate in goal setting Time For Goal Achievement: 05/06/20 Potential to Achieve Goals: Fair    Frequency Min 2X/week (increase frequency if pt's ability to participate improves)   Barriers to discharge        Co-evaluation PT/OT/SLP Co-Evaluation/Treatment: Yes Reason for Co-Treatment: Necessary to address cognition/behavior during functional activity;For patient/therapist safety;To address functional/ADL transfers PT goals addressed during session: Mobility/safety with mobility         AM-PAC PT "6 Clicks" Mobility  Outcome Measure Help needed turning from your back to  your side while in a flat bed without using bedrails?: A Little Help needed moving from lying on your back to sitting on the side of a flat bed without using bedrails?: Total Help needed moving to and from a bed to a chair (including a wheelchair)?: Total Help needed standing up from a chair using your arms (e.g., wheelchair or bedside chair)?: Total Help needed to walk in hospital room?: Total Help needed climbing 3-5 steps with a railing? : Total 6 Click Score: 8    End of Session   Activity Tolerance: Treatment limited secondary to agitation;Other (comment) (limited by cognition) Patient left: in bed;with call bell/phone within reach;with restraints reapplied Nurse Communication: Mobility status PT Visit Diagnosis: Other abnormalities of gait and mobility (R26.89);Other symptoms and signs involving the nervous system (R29.898)    Time: 1237 ([phone] ([id number]) PT Time Calculation (min) (ACUTE ONLY): 34 min (26 minutes total)   Charges:   PT Evaluation $PT Eval Low Complexity: 1 Low PT Treatments $Therapeutic Activity: 8-22 mins       Wynn Maudlin, DPT Acute Rehabilitation Services  Office [phone] Pager [phone]  04/22/2020 2:05 PM

## 2020-04-22 NOTE — ED Notes (Signed)
Physical therapy at bedside

## 2020-04-22 NOTE — ED Notes (Signed)
Pt found sitting up on side of bed, naked, oxygen off, unable to follow commands. Placed back in bed. Mitts continued. Placed back on 15L HFNC and on cardiac monitoring. Will continue to monitor.

## 2020-04-22 NOTE — Progress Notes (Signed)
Occupational Therapy Evaluation  Patient with functional deficits listed below impacting safety and independence with self care. Patient moaning throughout session with minimal coherent verbalizations, does state "it hurts" but unable to specify where. Patient very restless and ultimately placed herself in prone position with min G for safety and unable to return patient to supine to attempt further bed mobility. Patient doffing her O2 and had doffed a mitt, OT able to don mitt and O2 with patient saturating in 90s on 15L HFNC. Will continue to follow for patient progress/improved mentation in order to advance patient mobility, activity tolerance needed for independence with ADLs.     04/22/20 1400  OT Visit Information  Last OT Received On 04/22/20  Assistance Needed +2  PT/OT/SLP Co-Evaluation/Treatment Yes  Reason for Co-Treatment Necessary to address cognition/behavior during functional activity;For patient/therapist safety;To address functional/ADL transfers  OT goals addressed during session ADL's and self-care  History of Present Illness Charlene Parker is a 69 y.o. female with PMHx HTN, GERD, Diabetes who presents to the ED today via EMS for SOB. Pt received her first dose of Moderna vaccine on Monday 08/23; she began developing COVID like symptoms on 08/27 then tested positive on 08/28. Pt went to [facility] ED on 09/03 for SOB, generalized weakness, substernal chest pain/epigastric pain, and cough. She was treated with LR after being found to be tachycardic. She was well appearing afterwards and was discharged home. Pt went to the Infusion Clinic 9/5 and found to be hypoxic at 84% on RA and confused. Pt transferred vis EMS to [facility].  Precautions  Precautions Fall  Restrictions  Weight Bearing Restrictions No  Home Living  Family/patient expects to be discharged to: Private residence  Living Arrangements Spouse/significant other  Additional Comments patient unable to provide any  history or PLOF due to impaired cognition  Prior Function  Comments per chart review pt appears to be independent at home with her spouse available for support as needed. She was unable to provide history.  Communication  Communication Other (comment) (incoherrant moaning, minimal engagement in conversation)  Pain Assessment  Pain Assessment Faces  Faces Pain Scale 4  Pain Location pt unable to verbalize/state when asked; stating "it hurts"  Pain Descriptors / Indicators Discomfort;Guarding;Grimacing  Pain Intervention(s) Monitored during session  Cognition  Arousal/Alertness Awake/alert (kept eyes closed despite cues)  Behavior During Therapy Restless  Overall Cognitive Status No family/caregiver present to determine baseline cognitive functioning  Area of Impairment Orientation;Attention;Following commands;Safety/judgement;Awareness  Orientation Level Disoriented to;Place;Time;Situation  Current Attention Level Focused  Following Commands Follows one step commands inconsistently  Safety/Judgement Decreased awareness of safety;Decreased awareness of deficits  Awareness Intellectual  General Comments patient not following 1 step directions for bed mobility, could understand "it hurts" when asked where on multiple occasions unable to specify  Upper Extremity Assessment  Upper Extremity Assessment Difficult to assess due to impaired cognition  Lower Extremity Assessment  Lower Extremity Assessment Defer to PT evaluation  Cervical / Trunk Assessment  Cervical / Trunk Assessment Other exceptions  Cervical / Trunk Exceptions large habitus  ADL  Overall ADL's  Needs assistance/impaired  Eating/Feeding Total assistance  Grooming Total assistance  Grooming Details (indicate cue type and reason) attempted to have patient wash her face however does not initiate, continues to moan  Upper Body Bathing Total assistance  Lower Body Bathing Total assistance  Upper Body Dressing  Total  assistance  Lower Body Dressing Total assistance  Toilet Transfer Details (indicate cue type and reason) unable to  assess safely  Toileting- Clothing Manipulation and Hygiene Total assistance  General ADL Comments patient will move on own volition in bed, very restless however will not follow directions to safely perform bed mobility or functional tasks  Bed Mobility  Overal bed mobility Needs Assistance  Bed Mobility Rolling  Rolling Min guard  General bed mobility comments min guard for safety however patient is very restless and moving around in bed. placed herself in prone without physical assistance other than min G for safety as patient is up against bed rail. will not follow directions to return to supine or attempt to sit up at EOB  Transfers  General transfer comment deferred d/t safety and AMS  General Comments  General comments (skin integrity, edema, etc.) patient removing O2, able to don with saturations maintaining in mid 90s on 15L HFNC  OT - End of Session  Equipment Utilized During Treatment Oxygen  Activity Tolerance Treatment limited secondary to agitation;Other (comment) (AMS)  Patient left in bed;with call bell/phone within reach  Nurse Communication Mobility status  OT Assessment  OT Recommendation/Assessment Patient needs continued OT Services  OT Visit Diagnosis Other abnormalities of gait and mobility (R26.89);Other symptoms and signs involving cognitive function  OT Problem List Decreased activity tolerance;Cardiopulmonary status limiting activity;Decreased cognition;Decreased safety awareness;Obesity  OT Plan  OT Frequency (ACUTE ONLY) Min 2X/week  OT Treatment/Interventions (ACUTE ONLY) Self-care/ADL training;Therapeutic exercise;Energy conservation;DME and/or AE instruction;Therapeutic activities;Cognitive remediation/compensation;Patient/family education;Balance training  AM-PAC OT "6 Clicks" Daily Activity Outcome Measure (Version 2)  Help from another  person eating meals? 1  Help from another person taking care of personal grooming? 1  Help from another person toileting, which includes using toliet, bedpan, or urinal? 1  Help from another person bathing (including washing, rinsing, drying)? 1  Help from another person to put on and taking off regular upper body clothing? 1  Help from another person to put on and taking off regular lower body clothing? 1  6 Click Score 6  OT Recommendation  Follow Up Recommendations SNF;Supervision/Assistance - 24 hour  OT Equipment Other (comment) (defer to next venue)  Individuals Consulted  Consulted and Agree with Results and Recommendations Patient unable/family or caregiver not available  Acute Rehab OT Goals  Patient Stated Goal none stated  OT Goal Formulation Patient unable to participate in goal setting  Time For Goal Achievement 05/06/20  Potential to Achieve Goals Good  OT Time Calculation  OT Start Time (ACUTE ONLY) 1234  OT Stop Time (ACUTE ONLY) 1251  OT Time Calculation (min) 17 min  OT General Charges  $OT Visit 1 Visit  OT Evaluation  $OT Eval Low Complexity 1 Low  Written Expression  Dominant Hand  (unable to specify)   Charlene Parker OT OT pager: [phone]

## 2020-04-22 NOTE — Progress Notes (Signed)
PROGRESS NOTE  Charlene Parker BXU:[id number] DOB: March 29, 1951   PCP: Jaclyn Shaggy, MD  Patient is from: Home.  DOA: 04/19/2020 LOS: 3  Brief Narrative / Interim history: 69 year old female with history of DM-2, HTN, GERD and morbid obesity presenting from infusion clinic with hypoxemia to 84% and shortness of breath.  Patient had her first Moderna vaccine on 8/23.  Developed Covid-like symptoms on 8/27 and tested positive for COVID-19 on 8/28.  Presented to [facility] on 9/3 with SOB, cough, substernal chest pain and generalized weakness.  CXR compatible with multifocal pneumonia in the setting of COVID-19.  Reportedly maintained appropriate saturation with ambulation and discharged home.  She was directed to infusion clinic for monoclonal antibody.   Husband with COVID-19 infection at home.  In ED, confused.  CBG 59.  ABG without significant finding.  Saturating at 81% on RA requiring 6 L.  K3.0.  CRP 19.4.  Other inflammatory markers elevated.  Lactic acid 2.3>> 1.9.  Pro-Cal negative.  CXR consistent with multifocal pneumonia.  Received Decadron and remdesivir in ED.  Admitted for acute hypoxemic respiratory failure due to COVID-19 infection, acute metabolic encephalopathy and hypoglycemia.  She was a started on baricitinib, Solu-Medrol and remdesivir.  She is now requiring 15 L by HFNC/NRB.  She is also in soft four-point restraints as she frequently remove the O2 device and desaturates.   Subjective: Seen and examined this morning.  No major events overnight of this morning.  Still remains confused and agitated at times.  She required Ativan last night.  She is off wrist restraints this morning but still with mittens and bed rail cushions.  Somewhat sleepy.  Only oriented to self.  Does not appear to be in distress.  Objective: Vitals:   04/22/20 1043 04/22/20 1124 04/22/20 1200 04/22/20 1300  BP: (!) 142/90 (!) 147/93 (!) 145/82 139/82  Pulse: 72 77 75 83  Resp: 18 (!) 21 19 (!) 26  Temp:  98 F (36.7 C) 97.9 F (36.6 C)    TempSrc: Oral Oral    SpO2: 99% 99% 94% 90%    Intake/Output Summary (Last 24 hours) at 04/22/2020 1332 Last data filed at 04/22/2020 1040 Gross per 24 hour  Intake 1516.03 ml  Output --  Net 1516.03 ml   There were no vitals filed for this visit.  Examination:  GENERAL: No apparent distress.  Nontoxic. HEENT: MMM.  Vision and hearing grossly intact.  NECK: Supple.  No apparent JVD.  RESP: 96% on 15 L by HFNC.  No IWOB.  Fair aeration but limited exam CVS:  RRR. Heart sounds normal.  ABD/GI/GU: BS+. Abd soft, NTND.  MSK/EXT:  Moves extremities. No apparent deformity. No edema.  SKIN: no apparent skin lesion or wound NEURO: Somewhat drowsy.  Oriented to self.  No apparent focal neuro deficit. PSYCH: Calm. Normal affect.   Procedures:  None  Microbiology summarized: COVID-19 PCR positive.  Assessment & Plan: Acute hypoxemic respiratory failure due to COVID-19 pneumonia/infection-progressive symptoms since 8/27.  Tested positive on 8/28.  Desaturated as low as 81% on RA.  CXR with bilateral infiltrates.  Inflammatory markers elevated but downtrending.  Saturating at 96% on 50 L by HFNC. Recent Labs    04/20/20 0605 04/21/20 0450 04/22/20 0519  DDIMER 1.14* 1.63* 0.70*  FERRITIN 670* 975* 1,035*  CRP 15.1* 7.9* 2.5*  -Continue baricitinib, Solu-Medrol and remdesivir 9/5>>> increased Solu-Medrol to 60 mg twice daily on 9/6 -Continue supportive care with inhalers, mucolytic's, antitussive, vitamin C/zinc, incentive spirometry -  Continue monitoring inflammatory markers -PT/OT eval -Will de-escalate care to telemetry bed at this time  Acute metabolic encephalopathy due to COVID-19 infection: Now disoriented and trying to pull off her NRB.  Only oriented to self.  No apparent focal neuro deficit.  ABG without significant finding.  She is somewhat sleepy this morning likely due to Ativan.  She is off wrist restraints. -Treat COVID-19 infection  as above. -Reorientation and delirium precautions. -IV Haldol 2 mg every 6 hours as needed agitation.  QTC 451 -Discontinue Ativan.  Uncontrolled DM-2 with hyperglycemia and hypoglycemia-on insulin, Jardiance, Trulicity and Metformin at home.  A1c 7.2%.  Hyperglycemia likely due to steroids.  Expect worsening hyperglycemia with D5 for hypernatremia. Recent Labs  Lab 04/21/20 1142 04/21/20 1743 04/21/20 2209 04/22/20 0751 04/22/20 1122  GLUCAP 195* 175* 194* 249* 289*  -Continue SSI-high -Increase Levemir from 20 to 25 units twice daily -Increase NovoLog from 8 to 10 units AC -Continue Tradjenta and statin  Hypokalemia: Resolved.  Hypernatremia: Na 147> 151.  Did not improve with half-normal saline. - start D5 at 125 cc an hour  Essential hypertension: Normotensive. -Continue home metoprolol 25 mg twice daily  GERD-does not seem to be on meds at home. -Start Protonix while on steroid  Prolonged QTC-499 on initial EKG. 451 on repeat EKG.  Morbid obesity: BMI 37.73 in patient with uncontrolled diabetes. -Encourage lifestyle change to lose weight    DVT prophylaxis:  enoxaparin (LOVENOX) injection 40 mg Start: 04/19/20 2200  Code Status: Full code Family Communication: Updated  patient's husband over the phone Status is: Inpatient  Remains inpatient appropriate because:Persistent severe electrolyte disturbances, Altered mental status, IV treatments appropriate due to intensity of illness or inability to take PO and Inpatient level of care appropriate due to severity of illness.     Dispo: The patient is from: Home              Anticipated d/c is to: Home              Anticipated d/c date is: > 3 days              Patient currently is not medically stable to d/c.       Consultants:  None   Sch Meds:  Scheduled Meds:  albuterol  2 puff Inhalation Q6H WA   baricitinib  4 mg Oral Daily   enoxaparin (LOVENOX) injection  40 mg Subcutaneous Q24H   insulin  aspart  0-15 Units Subcutaneous TID WC   insulin aspart  0-5 Units Subcutaneous QHS   insulin aspart  10 Units Subcutaneous TID WC   insulin detemir  20 Units Subcutaneous BID   linagliptin  5 mg Oral Daily   methylPREDNISolone (SOLU-MEDROL) injection  60 mg Intravenous Q12H   metoprolol tartrate  25 mg Oral BID   pantoprazole  40 mg Oral Daily   Continuous Infusions:  dextrose 125 mL/hr at 04/22/20 1029   remdesivir 100 mg in NS 100 mL Stopped (04/22/20 1040)   PRN Meds:.acetaminophen, guaiFENesin-dextromethorphan, LORazepam  Antimicrobials: Anti-infectives (From admission, onward)   Start     Dose/Rate Route Frequency Ordered Stop   04/20/20 1000  remdesivir 100 mg in sodium chloride 0.9 % 100 mL IVPB       "Followed by" Linked Group Details   100 mg 200 mL/hr over 30 Minutes Intravenous Daily 04/19/20 1601 04/24/20 0959   04/20/20 1000  remdesivir 100 mg in sodium chloride 0.9 % 100 mL IVPB  Status:  Discontinued       "Followed by" Linked Group Details   100 mg 200 mL/hr over 30 Minutes Intravenous Daily 04/19/20 1915 04/19/20 1919   04/19/20 1915  remdesivir 200 mg in sodium chloride 0.9% 250 mL IVPB  Status:  Discontinued       "Followed by" Linked Group Details   200 mg 580 mL/hr over 30 Minutes Intravenous Once 04/19/20 1915 04/19/20 1919   04/19/20 1700  remdesivir 200 mg in sodium chloride 0.9% 250 mL IVPB       "Followed by" Linked Group Details   200 mg 580 mL/hr over 30 Minutes Intravenous Once 04/19/20 1601 04/19/20 1814       I have personally reviewed the following labs and images: CBC: Recent Labs  Lab 04/17/20 1829 04/19/20 1223 04/20/20 0605 04/21/20 0450 04/22/20 0519  WBC 7.2 9.1 5.2 7.5 12.5*  NEUTROABS 6.5 7.9* 4.2 6.6 11.0*  HGB 15.5* 15.3* 14.9 14.8 15.4*  HCT 46.2* 46.7* 45.9 45.3 47.2*  MCV 81.6 82.7 83.0 82.8 83.4  PLT 183 242 230 238 291   BMP &GFR Recent Labs  Lab 04/17/20 1829 04/19/20 1223 04/20/20 0605 04/21/20 0450  04/22/20 0519  NA 139 145 140 147* 151*  K 3.5 3.0* 3.8 4.0 4.2  CL 103 107 104 107 111  CO2 22 25 23 23  20*  GLUCOSE 244* 61* 237* 284* 268*  BUN 20 16 21  29* 30*  CREATININE 0.97 0.87 0.76 0.92 0.87  CALCIUM 9.0 9.3 9.1 9.6 9.9  MG  --   --  2.4 2.6* 2.7*   Estimated Creatinine Clearance: 68.5 mL/min (by C-G formula based on SCr of 0.87 mg/dL). Liver & Pancreas: Recent Labs  Lab 04/17/20 1829 04/19/20 1223 04/20/20 0605 04/21/20 0450 04/22/20 0519  AST 48* 40 35 28 24  ALT 37 28 27 25 25   ALKPHOS 46 45 42 45 45  BILITOT 0.8 0.7 0.8 0.8 0.7  PROT 8.0 7.9 7.0 7.5 7.3  ALBUMIN 3.5 3.2* 2.8* 3.1* 3.2*   No results for input(s): LIPASE, AMYLASE in the last 168 hours. No results for input(s): AMMONIA in the last 168 hours. Diabetic: Recent Labs    04/21/20 0450  HGBA1C 7.2*   Recent Labs  Lab 04/21/20 1142 04/21/20 1743 04/21/20 2209 04/22/20 0751 04/22/20 1122  GLUCAP 195* 175* 194* 249* 289*   Cardiac Enzymes: No results for input(s): CKTOTAL, CKMB, CKMBINDEX, TROPONINI in the last 168 hours. No results for input(s): PROBNP in the last 8760 hours. Coagulation Profile: No results for input(s): INR, PROTIME in the last 168 hours. Thyroid Function Tests: No results for input(s): TSH, T4TOTAL, FREET4, T3FREE, THYROIDAB in the last 72 hours. Lipid Profile: No results for input(s): CHOL, HDL, LDLCALC, TRIG, CHOLHDL, LDLDIRECT in the last 72 hours. Anemia Panel: Recent Labs    04/21/20 0450 04/22/20 0519  FERRITIN 975* 1,035*   Urine analysis:    Component Value Date/Time   COLORURINE YELLOW 04/22/2020 0300   APPEARANCEUR CLOUDY (A) 04/22/2020 0300   APPEARANCEUR Hazy 11/11/2014 1041   LABSPEC 1.032 (H) 04/22/2020 0300   LABSPEC 1.023 11/11/2014 1041   PHURINE 5.0 04/22/2020 0300   GLUCOSEU 50 (A) 04/22/2020 0300   GLUCOSEU >=500 11/11/2014 1041   HGBUR NEGATIVE 04/22/2020 0300   BILIRUBINUR NEGATIVE 04/22/2020 0300   BILIRUBINUR Negative 11/11/2014  1041   KETONESUR 5 (A) 04/22/2020 0300   PROTEINUR 30 (A) 04/22/2020 0300   NITRITE NEGATIVE 04/22/2020 0300   LEUKOCYTESUR NEGATIVE 04/22/2020 0300  LEUKOCYTESUR Negative 11/11/2014 1041   Sepsis Labs: Invalid input(s): PROCALCITONIN, LACTICIDVEN  Microbiology: Recent Results (from the past 240 hour(s))  Blood Culture (routine x 2)     Status: None (Preliminary result)   Collection Time: 04/19/20 12:23 PM   Specimen: BLOOD  Result Value Ref Range Status   Specimen Description   Final    BLOOD LEFT ANTECUBITAL Performed at [facility], [address] [place], [place] [address]    Special Requests   Final    BOTTLES DRAWN AEROBIC AND ANAEROBIC Blood Culture adequate volume Performed at [facility], [address] [place], [place] [address]    Culture   Final    NO GROWTH 3 DAYS Performed at [facility], [address] [place], [place] [address]    Report Status PENDING  Incomplete  Blood Culture (routine x 2)     Status: None (Preliminary result)   Collection Time: 04/19/20 12:28 PM   Specimen: BLOOD  Result Value Ref Range Status   Specimen Description   Final    BLOOD RIGHT ANTECUBITAL Performed at [facility], [address] [place], [place] [address]    Special Requests   Final    BOTTLES DRAWN AEROBIC AND ANAEROBIC Blood Culture adequate volume Performed at [facility], [address] [place], [place] [address]    Culture   Final    NO GROWTH 3 DAYS Performed at [facility], [address] [place], [place] [address]    Report Status PENDING  Incomplete    Radiology Studies: No results found.    Marquest Gunkel T. Chontel Warning [facility]  If 7PM-7AM, please contact night-coverage [email or web address] 04/22/2020, 1:32 PM

## 2020-04-22 NOTE — ED Notes (Signed)
Patient attempting to climb out of bed, pulling at mittens, oxygen off, patient readjusted in bed oxygen replaced

## 2020-04-22 NOTE — ED Notes (Signed)
CBG 249 

## 2020-04-22 NOTE — ED Notes (Signed)
Pt linens changed. New gown provided. Attempted to reapply purwick and patient did not tolerate. IV site improved. Warm blankets provided.

## 2020-04-22 NOTE — ED Notes (Signed)
Pt attempting to remove IV again, pt refusing to wear oxygen. Mitten restraints applied.

## 2020-04-22 NOTE — ED Notes (Signed)
Pt not able to follow commands, not able to tolerate meds PO. MD at bedside

## 2020-04-22 NOTE — ED Notes (Signed)
Pt resting comfortably.  O2 titrated to 13L HFNC.  Pt's O2 sat is 92%. Will continue to monitor.

## 2020-04-23 LAB — GLUCOSE, CAPILLARY
Glucose-Capillary: 139 mg/dL — ABNORMAL HIGH (ref 70–99)
Glucose-Capillary: 178 mg/dL — ABNORMAL HIGH (ref 70–99)
Glucose-Capillary: 213 mg/dL — ABNORMAL HIGH (ref 70–99)
Glucose-Capillary: 250 mg/dL — ABNORMAL HIGH (ref 70–99)
Glucose-Capillary: 284 mg/dL — ABNORMAL HIGH (ref 70–99)

## 2020-04-23 LAB — CBC WITH DIFFERENTIAL/PLATELET
Abs Immature Granulocytes: 0.18 10*3/uL — ABNORMAL HIGH (ref 0.00–0.07)
Basophils Absolute: 0 10*3/uL (ref 0.0–0.1)
Basophils Relative: 0 %
Eosinophils Absolute: 0 10*3/uL (ref 0.0–0.5)
Eosinophils Relative: 0 %
HCT: 44.4 % (ref 36.0–46.0)
Hemoglobin: 14.4 g/dL (ref 12.0–15.0)
Immature Granulocytes: 2 %
Lymphocytes Relative: 6 %
Lymphs Abs: 0.6 10*3/uL — ABNORMAL LOW (ref 0.7–4.0)
MCH: 26.8 pg (ref 26.0–34.0)
MCHC: 32.4 g/dL (ref 30.0–36.0)
MCV: 82.7 fL (ref 80.0–100.0)
Monocytes Absolute: 0.4 10*3/uL (ref 0.1–1.0)
Monocytes Relative: 4 %
Neutro Abs: 9.7 10*3/uL — ABNORMAL HIGH (ref 1.7–7.7)
Neutrophils Relative %: 88 %
Platelets: 221 10*3/uL (ref 150–400)
RBC: 5.37 MIL/uL — ABNORMAL HIGH (ref 3.87–5.11)
RDW: 13 % (ref 11.5–15.5)
WBC: 10.9 10*3/uL — ABNORMAL HIGH (ref 4.0–10.5)
nRBC: 0 % (ref 0.0–0.2)

## 2020-04-23 LAB — COMPREHENSIVE METABOLIC PANEL
ALT: 23 U/L (ref 0–44)
AST: 25 U/L (ref 15–41)
Albumin: 2.8 g/dL — ABNORMAL LOW (ref 3.5–5.0)
Alkaline Phosphatase: 43 U/L (ref 38–126)
Anion gap: 9 (ref 5–15)
BUN: 25 mg/dL — ABNORMAL HIGH (ref 8–23)
CO2: 23 mmol/L (ref 22–32)
Calcium: 9.2 mg/dL (ref 8.9–10.3)
Chloride: 110 mmol/L (ref 98–111)
Creatinine, Ser: 0.69 mg/dL (ref 0.44–1.00)
GFR calc Af Amer: >60 mL/min (ref >60)
GFR calc non Af Amer: >60 mL/min (ref >60)
Glucose, Bld: 137 mg/dL — ABNORMAL HIGH (ref 70–99)
Potassium: 3.5 mmol/L (ref 3.5–5.1)
Sodium: 142 mmol/L (ref 135–145)
Total Bilirubin: 0.9 mg/dL (ref 0.3–1.2)
Total Protein: 6.5 g/dL (ref 6.5–8.1)

## 2020-04-23 LAB — FERRITIN: Ferritin: 795 ng/mL — ABNORMAL HIGH (ref 11–307)

## 2020-04-23 LAB — D-DIMER, QUANTITATIVE: D-Dimer, Quant: 0.89 ug/mL-FEU — ABNORMAL HIGH (ref 0.00–0.50)

## 2020-04-23 LAB — C-REACTIVE PROTEIN: CRP: 1.4 mg/dL — ABNORMAL HIGH (ref <1.0)

## 2020-04-23 LAB — MAGNESIUM: Magnesium: 2.6 mg/dL — ABNORMAL HIGH (ref 1.7–2.4)

## 2020-04-23 MED ORDER — LORAZEPAM 2 MG/ML IJ SOLN
0.5000 mg | Freq: Four times a day (QID) | INTRAMUSCULAR | Status: DC | PRN
  Administered 2020-04-23 – 2020-04-24 (×4): 0.5 mg via INTRAVENOUS
  Filled 2020-04-23 (×5): qty 1

## 2020-04-23 MED ORDER — HALOPERIDOL LACTATE 5 MG/ML IJ SOLN
5.0000 mg | Freq: Once | INTRAMUSCULAR | Status: AC
  Administered 2020-04-23: 5 mg via INTRAVENOUS
  Filled 2020-04-23: qty 1

## 2020-04-23 MED ORDER — LORAZEPAM 2 MG/ML IJ SOLN
0.5000 mg | Freq: Once | INTRAMUSCULAR | Status: AC
  Administered 2020-04-23: 0.5 mg via INTRAVENOUS
  Filled 2020-04-23: qty 1

## 2020-04-23 MED ORDER — DEXTROSE-NACL 5-0.2 % IV SOLN: INTRAVENOUS | Status: DC

## 2020-04-23 NOTE — Progress Notes (Signed)
PROGRESS NOTE  Charlene Parker BDZ:[id number] DOB: 07/06/1951   PCP: Jaclyn Shaggy, MD  Patient is from: Home.  DOA: 04/19/2020 LOS: 4  Brief Narrative / Interim history: 69 year old female with history of DM-2, HTN, GERD and morbid obesity presenting from infusion clinic with hypoxemia to 84% and shortness of breath.  Patient had her first Moderna vaccine on 8/23.  Developed Covid-like symptoms on 8/27 and tested positive for COVID-19 on 8/28.  Presented to [facility] on 9/3 with SOB, cough, substernal chest pain and generalized weakness.  CXR compatible with multifocal pneumonia in the setting of COVID-19.  Reportedly maintained appropriate saturation with ambulation and discharged home.  She was directed to infusion clinic for monoclonal antibody.   Husband with COVID-19 infection at home.  In ED, confused.  CBG 59.  ABG without significant finding.  Saturating at 81% on RA requiring 6 L.  K3.0.  CRP 19.4.  Other inflammatory markers elevated.  Lactic acid 2.3>> 1.9.  Pro-Cal negative.  CXR consistent with multifocal pneumonia.  Received Decadron and remdesivir in ED.  Admitted for acute hypoxemic respiratory failure due to COVID-19 infection, acute metabolic encephalopathy and hypoglycemia.  She was a started on baricitinib, Solu-Medrol and remdesivir.  She required up to 15 L by HFNC/NRB.  She remains confused/disoriented requiring chemical and physical restraints as she continued to try to climb out of the bed, pull off oxygen and IV lines.  Subjective: Seen and examined earlier this morning. Continues to attempt to climb out of the bed, pulling at mittens.  She took her oxygen off and desaturated.  She also pulled out her IV line.  She did not respond well to IV Haldol.  Restarted on soft wrist restraints overnight.  She remains confused.  She is now down to 13 L by HFNC.  Objective: Vitals:   04/23/20 0836 04/23/20 0904 04/23/20 1219 04/23/20 1421  BP: 131/78  (!) 153/90   Pulse: 82  (!) 109  96  Resp: (!) 25 20 20    Temp: 98.6 F (37 C)  98.8 F (37.1 C)   TempSrc:      SpO2: 95% 90% 93% 97%  Weight:        Intake/Output Summary (Last 24 hours) at 04/23/2020 1453 Last data filed at 04/23/2020 0700 Gross per 24 hour  Intake --  Output 450 ml  Net -450 ml   Filed Weights   04/23/20 0010  Weight: 97 kg    Examination:  GENERAL: No apparent distress.  Confused.  Sitting on the bed with bilateral wrist restraints HEENT: MMM.  Vision and hearing grossly intact.  NECK: Supple.  No apparent JVD.  RESP: 94% on 13 L by HFNC.  No IWOB.  Fair aeration CVS:  RRR. Heart sounds normal.  ABD/GI/GU: BS+. Abd soft, NTND.  MSK/EXT:  Moves extremities.  Bilateral wrist restraints.  SKIN: no apparent skin lesion or wound NEURO: Awake but not quite alert.  Oriented x0.  Barely follows command.  No apparent focal neuro deficit. PSYCH: Confused.   Procedures:  None  Microbiology summarized: COVID-19 PCR positive.  Assessment & Plan: Acute hypoxemic respiratory failure due to COVID-19 pneumonia/infection-progressive symptoms since 8/27.  Tested positive on 8/28.  Desaturated as low as 81% on RA.  CXR with bilateral infiltrates.  Inflammatory markers elevated but downtrending.  Oxygen requirement improved a little bit.  She is now on 13 L by HFNC. Recent Labs    04/21/20 0450 04/22/20 0519 04/23/20 0444  DDIMER 1.63* 0.70* 0.89*  FERRITIN 975* 1,035* 795*  CRP 7.9* 2.5* 1.4*  -Remdesivir 9/5-9/9 -Continue baricitinib and Solu-Medrol 9/5>>> increased Solu-Medrol to 60 mg twice daily on 9/6 -Continue supportive care with inhalers, mucolytic's, antitussive, vitamin C/zinc, incentive spirometry -Continue monitoring inflammatory markers -PT/OT eval  Acute metabolic encephalopathy due to COVID-19 infection: Remains confused and disoriented.  No apparent focal neuro deficit.  ABG without significant finding.  Restarted on wrist restraints as she continuously tries to get out of the  bed, pull off oxygen.  She also pulled out IV line overnight.  No significant response to as needed Haldol. -Treat COVID-19 infection as above. -Reorientation and delirium precautions. -IV Ativan 0.5 mg every 6 hours as needed agitation.  -Continue bilateral wrist restraints  Uncontrolled DM-2 with hyperglycemia and hypoglycemia-on insulin, Jardiance, Trulicity and Metformin at home.  A1c 7.2%.  Hyperglycemia likely due to steroids. Recent Labs  Lab 04/22/20 1122 04/22/20 1635 04/22/20 2121 04/23/20 0821 04/23/20 1214  GLUCAP 289* 193* 139* 178* 213*  -Continue SSI-high -Continue Levemir 25 units twice daily -Continue NovoLog 10 units AC -Continue Tradjenta and statin  Hypokalemia: K3.5. -Replenish and recheck  Hypernatremia: Na 147> 151> 142.  Improved with D5. -Change D5 to D5-1/2NS-KCl due to risk for dehydration with restraints  Essential hypertension: Normotensive. -Continue home metoprolol 25 mg twice daily  GERD-does not seem to be on meds at home. -Start Protonix while on steroid  Prolonged QTC-499 on initial EKG. 451 on repeat EKG.  Morbid obesity: BMI 37.73 in patient with uncontrolled diabetes. -Encourage lifestyle change to lose weight    DVT prophylaxis:  enoxaparin (LOVENOX) injection 40 mg Start: 04/19/20 2200  Code Status: Full code Family Communication: Updated  patient's husband over the phone Status is: Inpatient  Remains inpatient appropriate because:Altered mental status, IV treatments appropriate due to intensity of illness or inability to take PO and Inpatient level of care appropriate due to severity of illness.     Dispo: The patient is from: Home              Anticipated d/c is to: Home              Anticipated d/c date is: > 3 days              Patient currently is not medically stable to d/c.       Consultants:  None   Sch Meds:  Scheduled Meds: . albuterol  2 puff Inhalation Q6H WA  . baricitinib  4 mg Oral Daily  .  enoxaparin (LOVENOX) injection  40 mg Subcutaneous Q24H  . insulin aspart  0-15 Units Subcutaneous TID WC  . insulin aspart  0-5 Units Subcutaneous QHS  . insulin aspart  10 Units Subcutaneous TID WC  . insulin detemir  20 Units Subcutaneous BID  . linagliptin  5 mg Oral Daily  . methylPREDNISolone (SOLU-MEDROL) injection  60 mg Intravenous Q12H  . metoprolol tartrate  25 mg Oral BID  . pantoprazole  40 mg Oral Daily   Continuous Infusions: . dextrose 5 % and 0.2 % NaCl 100 mL/hr at 04/23/20 1223   PRN Meds:.acetaminophen, guaiFENesin-dextromethorphan, haloperidol lactate, LORazepam  Antimicrobials: Anti-infectives (From admission, onward)   Start     Dose/Rate Route Frequency Ordered Stop   04/20/20 1000  remdesivir 100 mg in sodium chloride 0.9 % 100 mL IVPB       "Followed by" Linked Group Details   100 mg 200 mL/hr over 30 Minutes Intravenous Daily 04/19/20 1601 04/23/20 1256  04/20/20 1000  remdesivir 100 mg in sodium chloride 0.9 % 100 mL IVPB  Status:  Discontinued       "Followed by" Linked Group Details   100 mg 200 mL/hr over 30 Minutes Intravenous Daily 04/19/20 1915 04/19/20 1919   04/19/20 1915  remdesivir 200 mg in sodium chloride 0.9% 250 mL IVPB  Status:  Discontinued       "Followed by" Linked Group Details   200 mg 580 mL/hr over 30 Minutes Intravenous Once 04/19/20 1915 04/19/20 1919   04/19/20 1700  remdesivir 200 mg in sodium chloride 0.9% 250 mL IVPB       "Followed by" Linked Group Details   200 mg 580 mL/hr over 30 Minutes Intravenous Once 04/19/20 1601 04/19/20 1814       I have personally reviewed the following labs and images: CBC: Recent Labs  Lab 04/19/20 1223 04/20/20 0605 04/21/20 0450 04/22/20 0519 04/23/20 0444  WBC 9.1 5.2 7.5 12.5* 10.9*  NEUTROABS 7.9* 4.2 6.6 11.0* 9.7*  HGB 15.3* 14.9 14.8 15.4* 14.4  HCT 46.7* 45.9 45.3 47.2* 44.4  MCV 82.7 83.0 82.8 83.4 82.7  PLT 242 230 238 291 221   BMP &GFR Recent Labs  Lab  04/19/20 1223 04/20/20 0605 04/21/20 0450 04/22/20 0519 04/23/20 0444  NA 145 140 147* 151* 142  K 3.0* 3.8 4.0 4.2 3.5  CL 107 104 107 111 110  CO2 25 23 23  20* 23  GLUCOSE 61* 237* 284* 268* 137*  BUN 16 21 29* 30* 25*  CREATININE 0.87 0.76 0.92 0.87 0.69  CALCIUM 9.3 9.1 9.6 9.9 9.2  MG  --  2.4 2.6* 2.7* 2.6*   Estimated Creatinine Clearance: 74.6 mL/min (by C-G formula based on SCr of 0.69 mg/dL). Liver & Pancreas: Recent Labs  Lab 04/19/20 1223 04/20/20 0605 04/21/20 0450 04/22/20 0519 04/23/20 0444  AST 40 35 28 24 25   ALT 28 27 25 25 23   ALKPHOS 45 42 45 45 43  BILITOT 0.7 0.8 0.8 0.7 0.9  PROT 7.9 7.0 7.5 7.3 6.5  ALBUMIN 3.2* 2.8* 3.1* 3.2* 2.8*   No results for input(s): LIPASE, AMYLASE in the last 168 hours. No results for input(s): AMMONIA in the last 168 hours. Diabetic: Recent Labs    04/21/20 0450  HGBA1C 7.2*   Recent Labs  Lab 04/22/20 1122 04/22/20 1635 04/22/20 2121 04/23/20 0821 04/23/20 1214  GLUCAP 289* 193* 139* 178* 213*   Cardiac Enzymes: No results for input(s): CKTOTAL, CKMB, CKMBINDEX, TROPONINI in the last 168 hours. No results for input(s): PROBNP in the last 8760 hours. Coagulation Profile: No results for input(s): INR, PROTIME in the last 168 hours. Thyroid Function Tests: No results for input(s): TSH, T4TOTAL, FREET4, T3FREE, THYROIDAB in the last 72 hours. Lipid Profile: No results for input(s): CHOL, HDL, LDLCALC, TRIG, CHOLHDL, LDLDIRECT in the last 72 hours. Anemia Panel: Recent Labs    04/22/20 0519 04/23/20 0444  FERRITIN 1,035* 795*   Urine analysis:    Component Value Date/Time   COLORURINE YELLOW 04/22/2020 0300   APPEARANCEUR CLOUDY (A) 04/22/2020 0300   APPEARANCEUR Hazy 11/11/2014 1041   LABSPEC 1.032 (H) 04/22/2020 0300   LABSPEC 1.023 11/11/2014 1041   PHURINE 5.0 04/22/2020 0300   GLUCOSEU 50 (A) 04/22/2020 0300   GLUCOSEU >=500 11/11/2014 1041   HGBUR NEGATIVE 04/22/2020 0300   BILIRUBINUR  NEGATIVE 04/22/2020 0300   BILIRUBINUR Negative 11/11/2014 1041   KETONESUR 5 (A) 04/22/2020 0300   PROTEINUR 30 (A) 04/22/2020  0300   NITRITE NEGATIVE 04/22/2020 0300   LEUKOCYTESUR NEGATIVE 04/22/2020 0300   LEUKOCYTESUR Negative 11/11/2014 1041   Sepsis Labs: Invalid input(s): PROCALCITONIN, LACTICIDVEN  Microbiology: Recent Results (from the past 240 hour(s))  Blood Culture (routine x 2)     Status: None (Preliminary result)   Collection Time: 04/19/20 12:23 PM   Specimen: BLOOD  Result Value Ref Range Status   Specimen Description   Final    BLOOD LEFT ANTECUBITAL Performed at [facility], [address] [place], [place] [address]    Special Requests   Final    BOTTLES DRAWN AEROBIC AND ANAEROBIC Blood Culture adequate volume Performed at [facility], [address], [place], [place] [address]    Culture   Final    NO GROWTH 4 DAYS Performed at [facility], [address] [place], [place] [address]    Report Status PENDING  Incomplete  Blood Culture (routine x 2)     Status: None (Preliminary result)   Collection Time: 04/19/20 12:28 PM   Specimen: BLOOD  Result Value Ref Range Status   Specimen Description   Final    BLOOD RIGHT ANTECUBITAL Performed at [facility], [address], [place], [place] [address]    Special Requests   Final    BOTTLES DRAWN AEROBIC AND ANAEROBIC Blood Culture adequate volume Performed at [facility], [address], [place], [place] [address]    Culture   Final    NO GROWTH 4 DAYS Performed at [facility], [address], [place], [place] [address]    Report Status PENDING  Incomplete    Radiology Studies: No results found.    Jakeel Starliper T. Shanae Luo [facility]  If 7PM-7AM, please contact night-coverage [email or web address] 04/23/2020, 2:53 PM

## 2020-04-24 LAB — CULTURE, BLOOD (ROUTINE X 2)
Culture: NO GROWTH
Culture: NO GROWTH
Special Requests: ADEQUATE
Special Requests: ADEQUATE

## 2020-04-24 LAB — COMPREHENSIVE METABOLIC PANEL
ALT: 27 U/L (ref 0–44)
AST: 31 U/L (ref 15–41)
Albumin: 3 g/dL — ABNORMAL LOW (ref 3.5–5.0)
Alkaline Phosphatase: 42 U/L (ref 38–126)
Anion gap: 14 (ref 5–15)
BUN: 20 mg/dL (ref 8–23)
CO2: 21 mmol/L — ABNORMAL LOW (ref 22–32)
Calcium: 9.3 mg/dL (ref 8.9–10.3)
Chloride: 106 mmol/L (ref 98–111)
Creatinine, Ser: 0.67 mg/dL (ref 0.44–1.00)
GFR calc Af Amer: >60 mL/min (ref >60)
GFR calc non Af Amer: >60 mL/min (ref >60)
Glucose, Bld: 326 mg/dL — ABNORMAL HIGH (ref 70–99)
Potassium: 4 mmol/L (ref 3.5–5.1)
Sodium: 141 mmol/L (ref 135–145)
Total Bilirubin: 0.9 mg/dL (ref 0.3–1.2)
Total Protein: 6.7 g/dL (ref 6.5–8.1)

## 2020-04-24 LAB — CBC WITH DIFFERENTIAL/PLATELET
Abs Immature Granulocytes: 0.6 10*3/uL — ABNORMAL HIGH (ref 0.00–0.07)
Basophils Absolute: 0 10*3/uL (ref 0.0–0.1)
Basophils Relative: 0 %
Eosinophils Absolute: 0 10*3/uL (ref 0.0–0.5)
Eosinophils Relative: 0 %
HCT: 43.7 % (ref 36.0–46.0)
Hemoglobin: 14.5 g/dL (ref 12.0–15.0)
Immature Granulocytes: 5 %
Lymphocytes Relative: 5 %
Lymphs Abs: 0.5 10*3/uL — ABNORMAL LOW (ref 0.7–4.0)
MCH: 27.1 pg (ref 26.0–34.0)
MCHC: 33.2 g/dL (ref 30.0–36.0)
MCV: 81.7 fL (ref 80.0–100.0)
Monocytes Absolute: 0.3 10*3/uL (ref 0.1–1.0)
Monocytes Relative: 3 %
Neutro Abs: 9.5 10*3/uL — ABNORMAL HIGH (ref 1.7–7.7)
Neutrophils Relative %: 87 %
Platelets: 305 10*3/uL (ref 150–400)
RBC: 5.35 MIL/uL — ABNORMAL HIGH (ref 3.87–5.11)
RDW: 12.9 % (ref 11.5–15.5)
WBC: 11 10*3/uL — ABNORMAL HIGH (ref 4.0–10.5)
nRBC: 0 % (ref 0.0–0.2)

## 2020-04-24 LAB — GLUCOSE, CAPILLARY
Glucose-Capillary: 347 mg/dL — ABNORMAL HIGH (ref 70–99)
Glucose-Capillary: 269 mg/dL — ABNORMAL HIGH (ref 70–99)
Glucose-Capillary: 69 mg/dL — ABNORMAL LOW (ref 70–99)
Glucose-Capillary: 77 mg/dL (ref 70–99)
Glucose-Capillary: 99 mg/dL (ref 70–99)

## 2020-04-24 LAB — D-DIMER, QUANTITATIVE: D-Dimer, Quant: 1.05 ug/mL-FEU — ABNORMAL HIGH (ref 0.00–0.50)

## 2020-04-24 LAB — MAGNESIUM: Magnesium: 2.5 mg/dL — ABNORMAL HIGH (ref 1.7–2.4)

## 2020-04-24 LAB — FERRITIN: Ferritin: 731 ng/mL — ABNORMAL HIGH (ref 11–307)

## 2020-04-24 LAB — C-REACTIVE PROTEIN: CRP: 4.4 mg/dL — ABNORMAL HIGH (ref <1.0)

## 2020-04-24 MED ORDER — INSULIN DETEMIR 100 UNIT/ML ~~LOC~~ SOLN
30.0000 [IU] | Freq: Two times a day (BID) | SUBCUTANEOUS | Status: DC
  Administered 2020-04-25 (×2): 30 [IU] via SUBCUTANEOUS
  Filled 2020-04-24 (×2): qty 0.3

## 2020-04-24 MED ORDER — HYDROXYZINE HCL 50 MG/ML IM SOLN
50.0000 mg | Freq: Once | INTRAMUSCULAR | Status: AC
  Administered 2020-04-24: 50 mg via INTRAMUSCULAR
  Filled 2020-04-24: qty 1

## 2020-04-24 MED ORDER — KCL IN DEXTROSE-NACL 20-5-0.45 MEQ/L-%-% IV SOLN
INTRAVENOUS | Status: DC
  Filled 2020-04-24 (×14): qty 1000

## 2020-04-24 MED ORDER — INSULIN ASPART 100 UNIT/ML ~~LOC~~ SOLN
0.0000 [IU] | Freq: Three times a day (TID) | SUBCUTANEOUS | Status: DC
  Administered 2020-04-24 – 2020-04-25 (×4): 11 [IU] via SUBCUTANEOUS
  Administered 2020-04-26 (×2): 4 [IU] via SUBCUTANEOUS
  Administered 2020-04-27 (×2): 7 [IU] via SUBCUTANEOUS
  Administered 2020-04-28: 3 [IU] via SUBCUTANEOUS
  Administered 2020-04-28: 4 [IU] via SUBCUTANEOUS
  Administered 2020-04-28: 3 [IU] via SUBCUTANEOUS
  Administered 2020-04-29 (×2): 7 [IU] via SUBCUTANEOUS

## 2020-04-24 MED ORDER — INSULIN DETEMIR 100 UNIT/ML ~~LOC~~ SOLN: 25.0000 [IU] | Freq: Two times a day (BID) | SUBCUTANEOUS | Status: DC

## 2020-04-24 MED ORDER — HYDROXYZINE HCL 50 MG/ML IM SOLN
25.0000 mg | Freq: Four times a day (QID) | INTRAMUSCULAR | Status: DC | PRN
  Administered 2020-04-24 – 2020-04-26 (×5): 25 mg via INTRAMUSCULAR
  Filled 2020-04-24 (×7): qty 0.5

## 2020-04-24 MED ORDER — METOPROLOL TARTRATE 5 MG/5ML IV SOLN
2.5000 mg | Freq: Four times a day (QID) | INTRAVENOUS | Status: DC
  Administered 2020-04-24 – 2020-05-02 (×34): 2.5 mg via INTRAVENOUS
  Filled 2020-04-24 (×32): qty 5

## 2020-04-24 MED ORDER — DEXTROSE 50 % IV SOLN
INTRAVENOUS | Status: AC
  Administered 2020-04-24: 15 mL
  Filled 2020-04-24: qty 50

## 2020-04-24 MED ORDER — PANTOPRAZOLE SODIUM 40 MG IV SOLR
40.0000 mg | INTRAVENOUS | Status: DC
  Administered 2020-04-24 – 2020-05-04 (×11): 40 mg via INTRAVENOUS
  Filled 2020-04-24 (×11): qty 40

## 2020-04-24 MED ORDER — POTASSIUM CHLORIDE IN NACL 20-0.45 MEQ/L-% IV SOLN
INTRAVENOUS | Status: DC
  Filled 2020-04-24: qty 1000

## 2020-04-24 MED ORDER — INSULIN ASPART 100 UNIT/ML ~~LOC~~ SOLN: 0.0000 [IU] | Freq: Every day | SUBCUTANEOUS | Status: DC

## 2020-04-24 NOTE — Progress Notes (Signed)
RN spoke with husband on the telephone.  Husband stated that only other time patient exhibited confusion is when she had DKA which husband stated was about 1 year ago.

## 2020-04-24 NOTE — TOC Progression Note (Signed)
Transition of Care [facility]) - Progression Note    Patient Details  Name: Charlene Parker MRN: [id number] Date of Birth: September 05, 1950  [facility] CM/SW Contact  Geni Bers, RN Phone Number: 04/24/2020, 4:07 PM  Clinical Narrative:     Pt not ready for discharge.   Expected Discharge Plan: Skilled Nursing Facility Barriers to Discharge: No Barriers Identified  Expected Discharge Plan and Services Expected Discharge Plan: Skilled Nursing Facility                                               Social Determinants of Health (SDOH) Interventions    Readmission Risk Interventions No flowsheet data found.

## 2020-04-24 NOTE — Progress Notes (Signed)
PT Cancellation Note  Patient Details Name: Charlene Parker MRN: [id number] DOB: September 25, 1950   Cancelled Treatment:    Reason Eval/Treat Not Completed: Other (comment) (pt remains disorietned x4 and is restless and not following commands. Will follow up at later date/time as schedule allows and pt more able to participate.)   Renaldo Fiddler PT, DPT Acute Rehabilitation Services  Office [phone] Pager [phone]  04/24/2020 4:34 PM

## 2020-04-24 NOTE — Progress Notes (Signed)
Inpatient Diabetes Program Recommendations  AACE/ADA: New Consensus Statement on Inpatient Glycemic Control (2015)  Target Ranges:  Prepandial:   less than 140 mg/dL      Peak postprandial:   less than 180 mg/dL (1-2 hours)      Critically ill patients:  140 - 180 mg/dL   Lab Results  Component Value Date   GLUCAP 347 (H) 04/24/2020   HGBA1C 7.2 (H) 04/21/2020    Review of Glycemic Control Results for Charlene Parker, Charlene Parker (MRN [id number]) as of 04/24/2020 08:27  Ref. Range 04/23/2020 08:21 04/23/2020 12:14 04/23/2020 15:47 04/23/2020 20:10 04/24/2020 07:57  Glucose-Capillary Latest Ref Range: 70 - 99 mg/dL 122 (H) 449 (H) 753 (H) 284 (H) 347 (H)   Diabetes history: DM 2 Outpatient Diabetes medications: lantus 40 units Daily, metformin 500 mg bid, Jardiance 10 mg Daily, Humalog  Current orders for Inpatient glycemic control:  Levemir 20 units bid Novolog 0-15 tid + hs Novolog 10 units tid meal coverage Tradjenta 5 mg Daily  Solumedrol 60 mg Q12  Inpatient Diabetes Program Recommendations:    NOTE: Novolog meal coverage held yesterday. Unsure why, diet was ordered. Glucose now 300 range.  Watch for now hopefully pt will receive all insulin ordered.  RN-Please give Novolog meal coverage and notify MD if unable to.  Thanks,  Christena Deem RN, MSN, BC-ADM Inpatient Diabetes Coordinator Team Pager [phone] (8a-5p)

## 2020-04-24 NOTE — Progress Notes (Signed)
Hypoglycemic Event  CBG: 66  Treatment: Dextrose 15 mL  Symptoms: None  Follow-up CBG: Time:1759 CBG Result:77  Possible Reasons for Event: Patient not taking anything by mouth  Comments/MD notified: Dr. Patrcia Dolly

## 2020-04-24 NOTE — Progress Notes (Signed)
PROGRESS NOTE  Charlene Parker UYQ:[id number] DOB: 1951-05-10   PCP: Jaclyn Shaggy, MD  Patient is from: Home.  DOA: 04/19/2020 LOS: 5  Brief Narrative / Interim history: 69 year old female with history of DM-2, HTN, GERD and morbid obesity presenting from infusion clinic with hypoxemia to 84% and shortness of breath.  Patient had her first Moderna vaccine on 8/23.  Developed Covid-like symptoms on 8/27 and tested positive for COVID-19 on 8/28.  Presented to [facility] on 9/3 with SOB, cough, substernal chest pain and generalized weakness.  CXR compatible with multifocal pneumonia in the setting of COVID-19.  Reportedly maintained appropriate saturation with ambulation and discharged home.  She was directed to infusion clinic for monoclonal antibody.   Husband with COVID-19 infection at home.  In ED, confused.  CBG 59.  ABG without significant finding.  Saturating at 81% on RA requiring 6 L. K3.0.  CRP 19.4.  Other inflammatory markers elevated.  Lactic acid 2.3>> 1.9.  Pro-Cal negative.  CXR consistent with multifocal pneumonia.  Received Decadron and remdesivir in ED.  Admitted for acute hypoxemic respiratory failure due to COVID-19 infection, acute metabolic encephalopathy and hypoglycemia.  She was a started on baricitinib, Solu-Medrol and remdesivir.  She required up to 15 L by HFNC/NRB.  She remains confused/disoriented requiring chemical and physical restraints as she continued to try to climb out of the bed, pull off oxygen and IV lines.  Subjective: Seen and examined earlier this morning.  Remains confused and agitated.  Per RN, she received 1 dose of Vistaril that helped overnight.  She is still restless and disoriented.  Objective: Vitals:   04/24/20 0146 04/24/20 0239 04/24/20 0611 04/24/20 1126  BP: 101/87  (!) 157/82 121/84  Pulse: 88  (!) 106 97  Resp: (!) 24 20 20 20   Temp: 98.6 F (37 C)  97.8 F (36.6 C) 98.5 F (36.9 C)  TempSrc: Axillary  Axillary Axillary  SpO2: 96% 100%  91% 91%  Weight:        Intake/Output Summary (Last 24 hours) at 04/24/2020 1404 Last data filed at 04/24/2020 1129 Gross per 24 hour  Intake 0 ml  Output 250 ml  Net -250 ml   Filed Weights   04/23/20 0010  Weight: 97 kg    Examination:  GENERAL: Confused and disoriented.  Sitting on bed with bilateral wrist restraints. HEENT: MMM.  Vision and hearing grossly intact.  NECK: Supple.  No apparent JVD.  RESP: 91% on 15 L.  No IWOB.  Fair aeration bilaterally. CVS:  RRR. Heart sounds normal.  ABD/GI/GU: BS+. Abd soft, NTND.  MSK/EXT:  Moves extremities.  Bilateral soft restraints. SKIN: no apparent skin lesion or wound NEURO: Awake but not quite alert.  Disoriented.  Barely follows command.  No apparent focal neuro deficit. PSYCH: Confused and restless.  Procedures:  None  Microbiology summarized: COVID-19 PCR positive.  Assessment & Plan: Acute hypoxemic respiratory failure due to COVID-19 pneumonia/infection-progressive symptoms since 8/27.  Tested positive on 8/28.  Desaturated as low as 81% on RA.  CXR with bilateral infiltrates.  Inflammatory markers uptrending today.  Saturation ranges from 91-100% on 15 L. Recent Labs    04/22/20 0519 04/23/20 0444 04/24/20 0444  DDIMER 0.70* 0.89* 1.05*  FERRITIN 1,035* 795* 731*  CRP 2.5* 1.4* 4.4*  -Remdesivir 9/5-9/9 -Continue baricitinib and Solu-Medrol 9/5>>> -Continue supportive care with inhalers, mucolytic's, antitussive, vitamin C/zinc, incentive spirometry -Continue monitoring inflammatory markers -PT/OT eval  Acute metabolic encephalopathy due to COVID-19 infection: still confused,  disoriented and agitated.  No apparent focal neuro deficit.  ABG without significant finding. No significant response to as needed Haldol.  Per RN, responded to Vistaril overnight -Treat COVID-19 infection as above. -Reorientation and delirium precautions. -IV Vistaril and IV Ativan as needed agitation -Continue bilateral wrist  restraints  Uncontrolled DM-2 with hyperglycemia and hypoglycemia-on insulin, Jardiance, Trulicity and Metformin at home.  A1c 7.2%.  Hyperglycemia likely due to steroids and D5. Recent Labs  Lab 04/23/20 1214 04/23/20 1547 04/23/20 2010 04/24/20 0757 04/24/20 1122  GLUCAP 213* 250* 284* 347* 269*  -Increase SSI to high -Continue Levemir from 20 to 30 units twice daily -Continue NovoLog 10 units AC -Continue Tradjenta and statin  Hypokalemia: Resolved. -Monitor and replenish as appropriate  Hypernatremia: Na 147> 151> 141.  Improved with D5. -Change D5-1/2NS-KCl to 0.45% NaCl-KCl@75cc /hr  Essential hypertension: Normotensive. -Continue home metoprolol 25 mg twice daily  GERD-does not seem to be on meds at home. -Start Protonix while on steroid  Prolonged QTC-499 on initial EKG. 451 on repeat EKG.  Morbid obesity: BMI 37.73 in patient with uncontrolled diabetes. -Encourage lifestyle change to lose weight    DVT prophylaxis:  enoxaparin (LOVENOX) injection 40 mg Start: 04/19/20 2200  Code Status: Full code Family Communication: Updated  patient's husband over the phone Status is: Inpatient  Remains inpatient appropriate because:Altered mental status, IV treatments appropriate due to intensity of illness or inability to take PO and Inpatient level of care appropriate due to severity of illness.     Dispo: The patient is from: Home              Anticipated d/c is to: Home              Anticipated d/c date is: > 3 days              Patient currently is not medically stable to d/c.       Consultants:  None   Sch Meds:  Scheduled Meds:  albuterol  2 puff Inhalation Q6H WA   baricitinib  4 mg Oral Daily   enoxaparin (LOVENOX) injection  40 mg Subcutaneous Q24H   insulin aspart  0-20 Units Subcutaneous TID WC   insulin aspart  0-5 Units Subcutaneous QHS   insulin aspart  10 Units Subcutaneous TID WC   insulin detemir  25 Units Subcutaneous BID    linagliptin  5 mg Oral Daily   methylPREDNISolone (SOLU-MEDROL) injection  60 mg Intravenous Q12H   metoprolol tartrate  2.5 mg Intravenous Q6H   pantoprazole (PROTONIX) IV  40 mg Intravenous Q24H   Continuous Infusions:  dextrose 5 % and 0.2 % NaCl 100 mL/hr at 04/24/20 0639   PRN Meds:.acetaminophen, guaiFENesin-dextromethorphan, haloperidol lactate, hydrOXYzine  Antimicrobials: Anti-infectives (From admission, onward)   Start     Dose/Rate Route Frequency Ordered Stop   04/20/20 1000  remdesivir 100 mg in sodium chloride 0.9 % 100 mL IVPB       "Followed by" Linked Group Details   100 mg 200 mL/hr over 30 Minutes Intravenous Daily 04/19/20 1601 04/23/20 1256   04/20/20 1000  remdesivir 100 mg in sodium chloride 0.9 % 100 mL IVPB  Status:  Discontinued       "Followed by" Linked Group Details   100 mg 200 mL/hr over 30 Minutes Intravenous Daily 04/19/20 1915 04/19/20 1919   04/19/20 1915  remdesivir 200 mg in sodium chloride 0.9% 250 mL IVPB  Status:  Discontinued       "Followed  by" Linked Group Details   200 mg 580 mL/hr over 30 Minutes Intravenous Once 04/19/20 1915 04/19/20 1919   04/19/20 1700  remdesivir 200 mg in sodium chloride 0.9% 250 mL IVPB       "Followed by" Linked Group Details   200 mg 580 mL/hr over 30 Minutes Intravenous Once 04/19/20 1601 04/19/20 1814       I have personally reviewed the following labs and images: CBC: Recent Labs  Lab 04/20/20 0605 04/21/20 0450 04/22/20 0519 04/23/20 0444 04/24/20 0444  WBC 5.2 7.5 12.5* 10.9* 11.0*  NEUTROABS 4.2 6.6 11.0* 9.7* 9.5*  HGB 14.9 14.8 15.4* 14.4 14.5  HCT 45.9 45.3 47.2* 44.4 43.7  MCV 83.0 82.8 83.4 82.7 81.7  PLT 230 238 291 221 305   BMP &GFR Recent Labs  Lab 04/20/20 0605 04/21/20 0450 04/22/20 0519 04/23/20 0444 04/24/20 0444  NA 140 147* 151* 142 141  K 3.8 4.0 4.2 3.5 4.0  CL 104 107 111 110 106  CO2 23 23 20* 23 21*  GLUCOSE 237* 284* 268* 137* 326*  BUN 21 29* 30* 25* 20    CREATININE 0.76 0.92 0.87 0.69 0.67  CALCIUM 9.1 9.6 9.9 9.2 9.3  MG 2.4 2.6* 2.7* 2.6* 2.5*   Estimated Creatinine Clearance: 74.6 mL/min (by C-G formula based on SCr of 0.67 mg/dL). Liver & Pancreas: Recent Labs  Lab 04/20/20 0605 04/21/20 0450 04/22/20 0519 04/23/20 0444 04/24/20 0444  AST 35 28 24 25 31   ALT 27 25 25 23 27   ALKPHOS 42 45 45 43 42  BILITOT 0.8 0.8 0.7 0.9 0.9  PROT 7.0 7.5 7.3 6.5 6.7  ALBUMIN 2.8* 3.1* 3.2* 2.8* 3.0*   No results for input(s): LIPASE, AMYLASE in the last 168 hours. No results for input(s): AMMONIA in the last 168 hours. Diabetic: No results for input(s): HGBA1C in the last 72 hours. Recent Labs  Lab 04/23/20 1214 04/23/20 1547 04/23/20 2010 04/24/20 0757 04/24/20 1122  GLUCAP 213* 250* 284* 347* 269*   Cardiac Enzymes: No results for input(s): CKTOTAL, CKMB, CKMBINDEX, TROPONINI in the last 168 hours. No results for input(s): PROBNP in the last 8760 hours. Coagulation Profile: No results for input(s): INR, PROTIME in the last 168 hours. Thyroid Function Tests: No results for input(s): TSH, T4TOTAL, FREET4, T3FREE, THYROIDAB in the last 72 hours. Lipid Profile: No results for input(s): CHOL, HDL, LDLCALC, TRIG, CHOLHDL, LDLDIRECT in the last 72 hours. Anemia Panel: Recent Labs    04/23/20 0444 04/24/20 0444  FERRITIN 795* 731*   Urine analysis:    Component Value Date/Time   COLORURINE YELLOW 04/22/2020 0300   APPEARANCEUR CLOUDY (A) 04/22/2020 0300   APPEARANCEUR Hazy 11/11/2014 1041   LABSPEC 1.032 (H) 04/22/2020 0300   LABSPEC 1.023 11/11/2014 1041   PHURINE 5.0 04/22/2020 0300   GLUCOSEU 50 (A) 04/22/2020 0300   GLUCOSEU >=500 11/11/2014 1041   HGBUR NEGATIVE 04/22/2020 0300   BILIRUBINUR NEGATIVE 04/22/2020 0300   BILIRUBINUR Negative 11/11/2014 1041   KETONESUR 5 (A) 04/22/2020 0300   PROTEINUR 30 (A) 04/22/2020 0300   NITRITE NEGATIVE 04/22/2020 0300   LEUKOCYTESUR NEGATIVE 04/22/2020 0300   LEUKOCYTESUR  Negative 11/11/2014 1041   Sepsis Labs: Invalid input(s): PROCALCITONIN, LACTICIDVEN  Microbiology: Recent Results (from the past 240 hour(s))  Blood Culture (routine x 2)     Status: None   Collection Time: 04/19/20 12:23 PM   Specimen: BLOOD  Result Value Ref Range Status   Specimen Description   Final  BLOOD LEFT ANTECUBITAL Performed at [facility], [address] [place], [place] [address]    Special Requests   Final    BOTTLES DRAWN AEROBIC AND ANAEROBIC Blood Culture adequate volume Performed at [facility], [address] [place], [place] [address]    Culture   Final    NO GROWTH 5 DAYS Performed at [facility], [address] [place], [place] [address]    Report Status 04/24/2020 FINAL  Final  Blood Culture (routine x 2)     Status: None   Collection Time: 04/19/20 12:28 PM   Specimen: BLOOD  Result Value Ref Range Status   Specimen Description   Final    BLOOD RIGHT ANTECUBITAL Performed at [facility], [address] [place], [place] [address]    Special Requests   Final    BOTTLES DRAWN AEROBIC AND ANAEROBIC Blood Culture adequate volume Performed at [facility], [address] [place], [place] [address]    Culture   Final    NO GROWTH 5 DAYS Performed at [facility], [address] [place], [place] [address]    Report Status 04/24/2020 FINAL  Final    Radiology Studies: No results found.    Jacqulin Brandenburger T. Gedalya Jim [facility]  If 7PM-7AM, please contact night-coverage [email or web address] 04/24/2020, 2:04 PM

## 2020-04-25 LAB — CBC WITH DIFFERENTIAL/PLATELET
Abs Immature Granulocytes: 0.53 10*3/uL — ABNORMAL HIGH (ref 0.00–0.07)
Basophils Absolute: 0 10*3/uL (ref 0.0–0.1)
Basophils Relative: 0 %
Eosinophils Absolute: 0 10*3/uL (ref 0.0–0.5)
Eosinophils Relative: 0 %
HCT: 44.4 % (ref 36.0–46.0)
Hemoglobin: 14.4 g/dL (ref 12.0–15.0)
Immature Granulocytes: 5 %
Lymphocytes Relative: 4 %
Lymphs Abs: 0.5 10*3/uL — ABNORMAL LOW (ref 0.7–4.0)
MCH: 26.8 pg (ref 26.0–34.0)
MCHC: 32.4 g/dL (ref 30.0–36.0)
MCV: 82.7 fL (ref 80.0–100.0)
Monocytes Absolute: 0.2 10*3/uL (ref 0.1–1.0)
Monocytes Relative: 2 %
Neutro Abs: 10.3 10*3/uL — ABNORMAL HIGH (ref 1.7–7.7)
Neutrophils Relative %: 89 %
Platelets: 239 10*3/uL (ref 150–400)
RBC: 5.37 MIL/uL — ABNORMAL HIGH (ref 3.87–5.11)
RDW: 13 % (ref 11.5–15.5)
WBC: 11.6 10*3/uL — ABNORMAL HIGH (ref 4.0–10.5)
nRBC: 0 % (ref 0.0–0.2)

## 2020-04-25 LAB — GLUCOSE, CAPILLARY
Glucose-Capillary: 152 mg/dL — ABNORMAL HIGH (ref 70–99)
Glucose-Capillary: 156 mg/dL — ABNORMAL HIGH (ref 70–99)
Glucose-Capillary: 252 mg/dL — ABNORMAL HIGH (ref 70–99)
Glucose-Capillary: 253 mg/dL — ABNORMAL HIGH (ref 70–99)
Glucose-Capillary: 263 mg/dL — ABNORMAL HIGH (ref 70–99)
Glucose-Capillary: 273 mg/dL — ABNORMAL HIGH (ref 70–99)

## 2020-04-25 LAB — COMPREHENSIVE METABOLIC PANEL
ALT: 33 U/L (ref 0–44)
AST: 32 U/L (ref 15–41)
Albumin: 2.9 g/dL — ABNORMAL LOW (ref 3.5–5.0)
Alkaline Phosphatase: 48 U/L (ref 38–126)
Anion gap: 13 (ref 5–15)
BUN: 17 mg/dL (ref 8–23)
CO2: 23 mmol/L (ref 22–32)
Calcium: 9.2 mg/dL (ref 8.9–10.3)
Chloride: 104 mmol/L (ref 98–111)
Creatinine, Ser: 0.62 mg/dL (ref 0.44–1.00)
GFR calc Af Amer: >60 mL/min (ref >60)
GFR calc non Af Amer: >60 mL/min (ref >60)
Glucose, Bld: 269 mg/dL — ABNORMAL HIGH (ref 70–99)
Potassium: 4.3 mmol/L (ref 3.5–5.1)
Sodium: 140 mmol/L (ref 135–145)
Total Bilirubin: 1.3 mg/dL — ABNORMAL HIGH (ref 0.3–1.2)
Total Protein: 6.4 g/dL — ABNORMAL LOW (ref 6.5–8.1)

## 2020-04-25 LAB — D-DIMER, QUANTITATIVE: D-Dimer, Quant: 1.28 ug/mL-FEU — ABNORMAL HIGH (ref 0.00–0.50)

## 2020-04-25 LAB — C-REACTIVE PROTEIN: CRP: 2.8 mg/dL — ABNORMAL HIGH (ref <1.0)

## 2020-04-25 LAB — FERRITIN: Ferritin: 727 ng/mL — ABNORMAL HIGH (ref 11–307)

## 2020-04-25 MED ORDER — LORAZEPAM 2 MG/ML IJ SOLN
1.0000 mg | Freq: Four times a day (QID) | INTRAMUSCULAR | Status: DC | PRN
  Administered 2020-04-25 – 2020-05-03 (×9): 1 mg via INTRAVENOUS
  Filled 2020-04-25 (×9): qty 1

## 2020-04-25 MED ORDER — INSULIN DETEMIR 100 UNIT/ML ~~LOC~~ SOLN
35.0000 [IU] | Freq: Two times a day (BID) | SUBCUTANEOUS | Status: DC
  Administered 2020-04-25 – 2020-04-27 (×4): 35 [IU] via SUBCUTANEOUS
  Filled 2020-04-25 (×4): qty 0.35

## 2020-04-25 NOTE — Progress Notes (Signed)
PROGRESS NOTE  Charlene FearingGlenda H Petion ZOX:[id number] DOB: 11/24/1950   PCP: Jaclyn Shaggyate, Denny C, MD  Patient is from: Home.  DOA: 04/19/2020 LOS: 6  Brief Narrative / Interim history: 69 year old female with history of DM-2, HTN, GERD and morbid obesity presenting from infusion clinic with hypoxemia to 84% and shortness of breath.  Patient had her first Moderna vaccine on 8/23.  Developed Covid-like symptoms on 8/27 and tested positive for COVID-19 on 8/28.  [facility] with SOB, cough, substernal chest pain and generalized weakness.  CXR compatible with multifocal pneumonia in the setting of COVID-19.  Reportedly maintained appropriate saturation with ambulation and discharged home.  She was directed to infusion clinic for monoclonal antibody.   Husband with COVID-19 infection at home.  In ED, confused.  CBG 59.  ABG without significant finding.  Saturating at 81% on RA requiring 6 L. K3.0.  CRP 19.4.  Other inflammatory markers elevated.  Lactic acid 2.3>> 1.9.  Pro-Cal negative.  CXR consistent with multifocal pneumonia.  Received Decadron and remdesivir in ED.  Admitted for acute hypoxemic respiratory failure due to COVID-19 infection, acute metabolic encephalopathy and hypoglycemia.  She was a started on baricitinib, Solu-Medrol and remdesivir.  She is a still on 50 L by HFNC.  She remains confused/disoriented requiring chemical and physical restraints as she continued to try to climb out of the bed, pull off oxygen and IV lines.  Subjective: Seen and examined earlier this morning.  Remains confused, disoriented and agitated.  Continues to require bilateral wrist restraints with as needed Ativan and Vistaril.  She is restless and moaning but not able to provide history.  Objective: Vitals:   04/25/20 0140 04/25/20 0624 04/25/20 0800 04/25/20 1154  BP:  109/81  (!) 152/95  Pulse:  89  99  Resp:  (!) 26  19  Temp:  98.7 F (37.1 C)  98.1 F (36.7 C)  TempSrc:  Axillary  Oral  SpO2:   (!) 88% 94% 92%  Weight: 97 kg     Height: 5\' 3"  (1.6 m)       Intake/Output Summary (Last 24 hours) at 04/25/2020 1229 Last data filed at 04/25/2020 0639 Gross per 24 hour  Intake 3527.36 ml  Output --  Net 3527.36 ml   Filed Weights   04/23/20 0010 04/25/20 0140  Weight: 97 kg 97 kg    Examination:  GENERAL: No apparent distress.  Nontoxic. HEENT: MMM.  Vision and hearing grossly intact.  NECK: Supple.  No apparent JVD.  RESP: 88 to 93% on 50 L.  No IWOB.  Fair aeration bilaterally. CVS:  RRR. Heart sounds normal.  ABD/GI/GU: BS+. Abd soft, NTND.  MSK/EXT:  Moves extremities. No apparent deformity. No edema.  SKIN: no apparent skin lesion or wound NEURO: Awake but completely disoriented.  Does not follows command.  No apparent focal neuro deficit. PSYCH: Restless and confused.  Procedures:  None  Microbiology summarized: COVID-19 PCR positive.  Assessment & Plan: Acute hypoxemic respiratory failure due to COVID-19 pneumonia/infection-progressive symptoms since 8/27.  Tested positive on 8/28.  Desaturated as low as 81% on RA.  CXR with bilateral infiltrates.  Inflammatory markers downtrending.  Saturation ranges from 88 to 93% on 15 L by HFNC Recent Labs    04/23/20 0444 04/24/20 0444 04/25/20 0517  DDIMER 0.89* 1.05* 1.28*  FERRITIN 795* 731* 727*  CRP 1.4* 4.4* 2.8*  -Remdesivir 9/5-9/9 -Continue baricitinib and Solu-Medrol 9/5>>> -Continue supportive care with inhalers, mucolytic's, antitussive, vitamin C/zinc, incentive  spirometry -Continue monitoring inflammatory markers -PT/OT eval  Acute metabolic encephalopathy due to COVID-19 infection: still confused, disoriented and agitated.  No apparent focal neuro deficit.  ABG without significant finding. No significant response to as needed Haldol.  -Treat COVID-19 infection as above. -Reorientation and delirium precautions. -Continue IV Vistaril and IV Ativan as needed -Continue bilateral wrist  restraints  Uncontrolled DM-2 with hyperglycemia and hypoglycemia-on insulin, Jardiance, Trulicity and Metformin at home.  A1c 7.2%.  Hyperglycemia likely due to steroids and D5. Recent Labs  Lab 04/24/20 2034 04/25/20 0014 04/25/20 0419 04/25/20 0816 04/25/20 1152  GLUCAP 99 156* 252* 263* 273*  -Continue SSI high -Increase Levemir from 30 to 35 units twice daily -Continue NovoLog 10 units AC -Continue Tradjenta and statin-when able to take p.o.  Hypokalemia: Resolved. -Monitor and replenish as appropriate  Hypernatremia: Na 147> 151> 141.  Resolved. -Continue D5-1/2NS-KCl.  Increase rate to 100 cc an hour as she does not take p.o.  Essential hypertension: Normotensive. -IV metoprolol 2.5 mg every 6 hours  GERD-does not seem to be on meds at home. -Continue IV Protonix  Prolonged QTC-499 on initial EKG. 451 on repeat EKG. -Monitor K and Mg and optimize  Morbid obesity: BMI 37.73 in patient with uncontrolled diabetes. -Encourage lifestyle change to lose weight    DVT prophylaxis:  enoxaparin (LOVENOX) injection 40 mg Start: 04/19/20 2200  Code Status: Full code Family Communication: Updated  patient's husband over the phone Status is: Inpatient  Remains inpatient appropriate because:Altered mental status, IV treatments appropriate due to intensity of illness or inability to take PO and Inpatient level of care appropriate due to severity of illness.     Dispo: The patient is from: Home              Anticipated d/c is to: Home              Anticipated d/c date is: > 3 days              Patient currently is not medically stable to d/c.       Consultants:  None   Sch Meds:  Scheduled Meds: . albuterol  2 puff Inhalation Q6H WA  . baricitinib  4 mg Oral Daily  . enoxaparin (LOVENOX) injection  40 mg Subcutaneous Q24H  . insulin aspart  0-20 Units Subcutaneous TID WC  . insulin aspart  0-5 Units Subcutaneous QHS  . insulin aspart  10 Units Subcutaneous TID  WC  . insulin detemir  35 Units Subcutaneous BID  . linagliptin  5 mg Oral Daily  . methylPREDNISolone (SOLU-MEDROL) injection  60 mg Intravenous Q12H  . metoprolol tartrate  2.5 mg Intravenous Q6H  . pantoprazole (PROTONIX) IV  40 mg Intravenous Q24H   Continuous Infusions: . dextrose 5 % and 0.45 % NaCl with KCl 20 mEq/L 100 mL/hr at 04/25/20 1032   PRN Meds:.acetaminophen, guaiFENesin-dextromethorphan, haloperidol lactate, hydrOXYzine, LORazepam  Antimicrobials: Anti-infectives (From admission, onward)   Start     Dose/Rate Route Frequency Ordered Stop   04/20/20 1000  remdesivir 100 mg in sodium chloride 0.9 % 100 mL IVPB       "Followed by" Linked Group Details   100 mg 200 mL/hr over 30 Minutes Intravenous Daily 04/19/20 1601 04/23/20 1256   04/20/20 1000  remdesivir 100 mg in sodium chloride 0.9 % 100 mL IVPB  Status:  Discontinued       "Followed by" Linked Group Details   100 mg 200 mL/hr over 30 Minutes  Intravenous Daily 04/19/20 1915 04/19/20 1919   04/19/20 1915  remdesivir 200 mg in sodium chloride 0.9% 250 mL IVPB  Status:  Discontinued       "Followed by" Linked Group Details   200 mg 580 mL/hr over 30 Minutes Intravenous Once 04/19/20 1915 04/19/20 1919   04/19/20 1700  remdesivir 200 mg in sodium chloride 0.9% 250 mL IVPB       "Followed by" Linked Group Details   200 mg 580 mL/hr over 30 Minutes Intravenous Once 04/19/20 1601 04/19/20 1814       I have personally reviewed the following labs and images: CBC: Recent Labs  Lab 04/21/20 0450 04/22/20 0519 04/23/20 0444 04/24/20 0444 04/25/20 0517  WBC 7.5 12.5* 10.9* 11.0* 11.6*  NEUTROABS 6.6 11.0* 9.7* 9.5* 10.3*  HGB 14.8 15.4* 14.4 14.5 14.4  HCT 45.3 47.2* 44.4 43.7 44.4  MCV 82.8 83.4 82.7 81.7 82.7  PLT 238 291 221 305 239   BMP &GFR Recent Labs  Lab 04/20/20 0605 04/20/20 0605 04/21/20 0450 04/22/20 0519 04/23/20 0444 04/24/20 0444 04/25/20 0517  NA 140   < > 147* 151* 142 141 140  K  3.8   < > 4.0 4.2 3.5 4.0 4.3  CL 104   < > 107 111 110 106 104  CO2 23   < > 23 20* 23 21* 23  GLUCOSE 237*   < > 284* 268* 137* 326* 269*  BUN 21   < > 29* 30* 25* 20 17  CREATININE 0.76   < > 0.92 0.87 0.69 0.67 0.62  CALCIUM 9.1   < > 9.6 9.9 9.2 9.3 9.2  MG 2.4  --  2.6* 2.7* 2.6* 2.5*  --    < > = values in this interval not displayed.   Estimated Creatinine Clearance: 74.6 mL/min (by C-G formula based on SCr of 0.62 mg/dL). Liver & Pancreas: Recent Labs  Lab 04/21/20 0450 04/22/20 0519 04/23/20 0444 04/24/20 0444 04/25/20 0517  AST 28 24 25 31  32  ALT 25 25 23 27  33  ALKPHOS 45 45 43 42 48  BILITOT 0.8 0.7 0.9 0.9 1.3*  PROT 7.5 7.3 6.5 6.7 6.4*  ALBUMIN 3.1* 3.2* 2.8* 3.0* 2.9*   No results for input(s): LIPASE, AMYLASE in the last 168 hours. No results for input(s): AMMONIA in the last 168 hours. Diabetic: No results for input(s): HGBA1C in the last 72 hours. Recent Labs  Lab 04/24/20 2034 04/25/20 0014 04/25/20 0419 04/25/20 0816 04/25/20 1152  GLUCAP 99 156* 252* 263* 273*   Cardiac Enzymes: No results for input(s): CKTOTAL, CKMB, CKMBINDEX, TROPONINI in the last 168 hours. No results for input(s): PROBNP in the last 8760 hours. Coagulation Profile: No results for input(s): INR, PROTIME in the last 168 hours. Thyroid Function Tests: No results for input(s): TSH, T4TOTAL, FREET4, T3FREE, THYROIDAB in the last 72 hours. Lipid Profile: No results for input(s): CHOL, HDL, LDLCALC, TRIG, CHOLHDL, LDLDIRECT in the last 72 hours. Anemia Panel: Recent Labs    04/24/20 0444 04/25/20 0517  FERRITIN 731* 727*   Urine analysis:    Component Value Date/Time   COLORURINE YELLOW 04/22/2020 0300   APPEARANCEUR CLOUDY (A) 04/22/2020 0300   APPEARANCEUR Hazy 11/11/2014 1041   LABSPEC 1.032 (H) 04/22/2020 0300   LABSPEC 1.023 11/11/2014 1041   PHURINE 5.0 04/22/2020 0300   GLUCOSEU 50 (A) 04/22/2020 0300   GLUCOSEU >=500 11/11/2014 1041   HGBUR NEGATIVE  04/22/2020 0300   BILIRUBINUR NEGATIVE 04/22/2020 0300  BILIRUBINUR Negative 11/11/2014 1041   KETONESUR 5 (A) 04/22/2020 0300   PROTEINUR 30 (A) 04/22/2020 0300   NITRITE NEGATIVE 04/22/2020 0300   LEUKOCYTESUR NEGATIVE 04/22/2020 0300   LEUKOCYTESUR Negative 11/11/2014 1041   Sepsis Labs: Invalid input(s): PROCALCITONIN, LACTICIDVEN  Microbiology: Recent Results (from the past 240 hour(s))  Blood Culture (routine x 2)     Status: None   Collection Time: 04/19/20 12:23 PM   Specimen: BLOOD  Result Value Ref Range Status   Specimen Description   Final    BLOOD LEFT ANTECUBITAL Performed at [facility], [address] [place], [place] [address]    Special Requests   Final    BOTTLES DRAWN AEROBIC AND ANAEROBIC Blood Culture adequate volume Performed at [facility], [address] [place], [place] [address]    Culture   Final    NO GROWTH 5 DAYS Performed at [facility], [address] [place], [place] [address]    Report Status 04/24/2020 FINAL  Final  Blood Culture (routine x 2)     Status: None   Collection Time: 04/19/20 12:28 PM   Specimen: BLOOD  Result Value Ref Range Status   Specimen Description   Final    BLOOD RIGHT ANTECUBITAL Performed at [facility], [address] [place], [place] [address]    Special Requests   Final    BOTTLES DRAWN AEROBIC AND ANAEROBIC Blood Culture adequate volume Performed at [facility], [address], [place], [place] [address]    Culture   Final    NO GROWTH 5 DAYS Performed at [facility], [address] [place], [place] [address]    Report Status 04/24/2020 FINAL  Final    Radiology Studies: No results found.    Dolce Sylvia T. Jenne Sellinger [facility]  If 7PM-7AM, please contact night-coverage [email or web address] 04/25/2020, 12:29 PM

## 2020-04-25 NOTE — Progress Notes (Signed)
PT Cancellation Note  Patient Details Name: Charlene Parker MRN: [id number] DOB: 10-Aug-1951   Cancelled Treatment:    Reason Eval/Treat Not Completed: Other (comment) (agitated, confused, defer PT at this time)   [facility] 04/25/2020, 1:22 PM

## 2020-04-26 ENCOUNTER — Inpatient Hospital Stay (HOSPITAL_COMMUNITY): Payer: Managed Care, Other (non HMO)

## 2020-04-26 LAB — D-DIMER, QUANTITATIVE: D-Dimer, Quant: 1.64 ug/mL-FEU — ABNORMAL HIGH (ref 0.00–0.50)

## 2020-04-26 LAB — CBC WITH DIFFERENTIAL/PLATELET
Abs Immature Granulocytes: 0.78 10*3/uL — ABNORMAL HIGH (ref 0.00–0.07)
Basophils Absolute: 0.1 10*3/uL (ref 0.0–0.1)
Basophils Relative: 1 %
Eosinophils Absolute: 0.1 10*3/uL (ref 0.0–0.5)
Eosinophils Relative: 1 %
HCT: 45.1 % (ref 36.0–46.0)
Hemoglobin: 14.8 g/dL (ref 12.0–15.0)
Immature Granulocytes: 5 %
Lymphocytes Relative: 8 %
Lymphs Abs: 1.2 10*3/uL (ref 0.7–4.0)
MCH: 27 pg (ref 26.0–34.0)
MCHC: 32.8 g/dL (ref 30.0–36.0)
MCV: 82.3 fL (ref 80.0–100.0)
Monocytes Absolute: 0.3 10*3/uL (ref 0.1–1.0)
Monocytes Relative: 2 %
Neutro Abs: 13.1 10*3/uL — ABNORMAL HIGH (ref 1.7–7.7)
Neutrophils Relative %: 83 %
Platelets: 328 10*3/uL (ref 150–400)
RBC: 5.48 MIL/uL — ABNORMAL HIGH (ref 3.87–5.11)
RDW: 13.2 % (ref 11.5–15.5)
WBC: 15.5 10*3/uL — ABNORMAL HIGH (ref 4.0–10.5)
nRBC: 0.1 % (ref 0.0–0.2)

## 2020-04-26 LAB — COMPREHENSIVE METABOLIC PANEL
ALT: 31 U/L (ref 0–44)
AST: 32 U/L (ref 15–41)
Albumin: 2.8 g/dL — ABNORMAL LOW (ref 3.5–5.0)
Alkaline Phosphatase: 51 U/L (ref 38–126)
Anion gap: 11 (ref 5–15)
BUN: 14 mg/dL (ref 8–23)
CO2: 26 mmol/L (ref 22–32)
Calcium: 9.4 mg/dL (ref 8.9–10.3)
Chloride: 105 mmol/L (ref 98–111)
Creatinine, Ser: 0.65 mg/dL (ref 0.44–1.00)
GFR calc Af Amer: >60 mL/min (ref >60)
GFR calc non Af Amer: >60 mL/min (ref >60)
Glucose, Bld: 117 mg/dL — ABNORMAL HIGH (ref 70–99)
Potassium: 3.9 mmol/L (ref 3.5–5.1)
Sodium: 142 mmol/L (ref 135–145)
Total Bilirubin: 1.1 mg/dL (ref 0.3–1.2)
Total Protein: 6.7 g/dL (ref 6.5–8.1)

## 2020-04-26 LAB — GLUCOSE, CAPILLARY
Glucose-Capillary: 113 mg/dL — ABNORMAL HIGH (ref 70–99)
Glucose-Capillary: 179 mg/dL — ABNORMAL HIGH (ref 70–99)
Glucose-Capillary: 164 mg/dL — ABNORMAL HIGH (ref 70–99)
Glucose-Capillary: 144 mg/dL — ABNORMAL HIGH (ref 70–99)

## 2020-04-26 LAB — FERRITIN: Ferritin: 701 ng/mL — ABNORMAL HIGH (ref 11–307)

## 2020-04-26 LAB — C-REACTIVE PROTEIN: CRP: 2.1 mg/dL — ABNORMAL HIGH (ref <1.0)

## 2020-04-26 MED ORDER — IOHEXOL 350 MG/ML SOLN
100.0000 mL | Freq: Once | INTRAVENOUS | Status: AC | PRN
  Administered 2020-04-26: 100 mL via INTRAVENOUS

## 2020-04-26 MED ORDER — ALBUTEROL SULFATE HFA 108 (90 BASE) MCG/ACT IN AERS
2.0000 | INHALATION_SPRAY | Freq: Three times a day (TID) | RESPIRATORY_TRACT | Status: DC
  Administered 2020-04-27 – 2020-05-04 (×14): 2 via RESPIRATORY_TRACT
  Filled 2020-04-26: qty 6.7

## 2020-04-26 NOTE — Progress Notes (Signed)
PROGRESS NOTE  Charlene FearingGlenda H Parker ZOX:[id number] DOB: 07/21/1951   PCP: Jaclyn Shaggyate, Denny C, MD  Patient is from: Home.  DOA: 04/19/2020 LOS: 7  Brief Narrative / Interim history: 69 year old female with history of DM-2, HTN, GERD and morbid obesity presenting from infusion clinic with hypoxemia to 84% and shortness of breath.  Patient had her first Moderna vaccine on 8/23.  Developed Covid-like symptoms on 8/27 and tested positive for COVID-19 on 8/28.  [facility] with SOB, cough, substernal chest pain and generalized weakness.  CXR compatible with multifocal pneumonia in the setting of COVID-19.  Reportedly maintained appropriate saturation with ambulation and discharged home.  She was directed to infusion clinic for monoclonal antibody.   Husband with COVID-19 infection at home.  In ED, confused.  CBG 59.  ABG without significant finding.  Saturating at 81% on RA requiring 6 L. K3.0.  CRP 19.4.  Other inflammatory markers elevated.  Lactic acid 2.3>> 1.9.  Pro-Cal negative.  CXR consistent with multifocal pneumonia.  Received Decadron and remdesivir in ED.  Admitted for acute hypoxemic respiratory failure due to COVID-19 infection, acute metabolic encephalopathy and hypoglycemia.  She was a started on baricitinib, Solu-Medrol and remdesivir.  She is a still on 50 L by HFNC.  She remains confused/disoriented requiring chemical and physical restraints as she continued to try to climb out of the bed, pull off oxygen and IV lines.  Assessment & Plan: Acute hypoxemic respiratory failure due to COVID-19 pneumonia/infection-progressive symptoms since 8/27.  Tested positive on 8/28.  Desaturated as low as 81% on RA.  CXR with bilateral infiltrates.  Inflammatory markers mixed/upgoing and downgoing.  Saturation ranges from 88 to 93% on 15 L by HFNC.  Patient's D-dimer continues to go up.  Will check CT angiogram as well as Doppler lower extremity to rule out PE/DVT respectively. -Remdesivir  9/5-9/9 -Continue baricitinib and Solu-Medrol 9/5>>> -Continue supportive care with inhalers, mucolytic's, antitussive, vitamin C/zinc, incentive spirometry -Continue monitoring inflammatory markers -PT/OT   Acute metabolic encephalopathy due to COVID-19 infection: still confused, disoriented and agitated.  No apparent focal neuro deficit.  ABG without significant finding. No significant response to as needed Haldol.  -Treat COVID-19 infection as above. -Reorientation and delirium precautions. -Continue IV Haldol and Ativan as needed but discontinue Vistaril. -Continue bilateral wrist restraints  Uncontrolled DM-2 with hyperglycemia and hypoglycemia-on insulin, Jardiance, Trulicity and Metformin at home.  A1c 7.2%.  Blood sugar much better controlled now.  Continue Levemir 35 units twice daily, Tradjenta, NovoLog 10 units AC and SSI.  Hypokalemia: Resolved. -Monitor and replenish as appropriate  Hypernatremia: Na 147> 151> 141.  Resolved. -Continue D5-1/2NS-KCl as she is not taking anything p.o.  Moderate protein calorie malnutrition: Reportedly patient has not taken anything oral for last 6 days due to being incoherent.  Will consult nutrition.  Patient may need TPN VS feedings through 2.  Essential hypertension: Normotensive. -IV metoprolol 2.5 mg every 6 hours  GERD-does not seem to be on meds at home. -Continue IV Protonix  Prolonged QTC-499 on initial EKG. 451 on repeat EKG. -Monitor K and Mg and optimize  Morbid obesity: BMI 37.73 in patient with uncontrolled diabetes. -Encourage lifestyle change to lose weight    DVT prophylaxis:  enoxaparin (LOVENOX) injection 40 mg Start: 04/19/20 2200  Code Status: Full code Family Communication: Personally updated patient's husband over the phone. Status is: Inpatient  Remains inpatient appropriate because:Altered mental status, IV treatments appropriate due to intensity of illness or inability to take  PO and Inpatient level of  care appropriate due to severity of illness.     Dispo: The patient is from: Home              Anticipated d/c is to: Home              Anticipated d/c date is: > 3 days              Patient currently is not medically stable to d/c.       Consultants:  None   Subjective: Patient seen and examined.  Patient too lethargic to have any sort of conversation.  She keeps moaning anytime she was touched.  Objective: Vitals:   04/26/20 0551 04/26/20 0720 04/26/20 1029 04/26/20 1245  BP: (!) 164/77  (!) 142/63 139/69  Pulse: 91  91 86  Resp: (!) 22  (!) 22 20  Temp: 98.2 F (36.8 C)  99.6 F (37.6 C) 99.1 F (37.3 C)  TempSrc:   Axillary   SpO2: 100% 90% 96% 93%  Weight:      Height:        Intake/Output Summary (Last 24 hours) at 04/26/2020 1320 Last data filed at 04/26/2020 1302 Gross per 24 hour  Intake 1911.64 ml  Output --  Net 1911.64 ml   Filed Weights   04/23/20 0010 04/25/20 0140  Weight: 97 kg 97 kg    Examination:  General exam: Very lethargic Respiratory system: Diminished breath sounds bilaterally respiratory effort normal. Cardiovascular system: S1 & S2 heard, RRR. No JVD, murmurs, rubs, gallops or clicks. No pedal edema. Gastrointestinal system: Abdomen is nondistended, soft and nontender. No organomegaly or masses felt. Normal bowel sounds heard. Central nervous system: Lethargic.  Unable to assess orientation.  Moving extremities spontaneously. Skin: No rashes, lesions or ulcers.    Procedures:  None  Microbiology summarized: COVID-19 PCR positive.   Sch Meds:  Scheduled Meds: . albuterol  2 puff Inhalation Q6H WA  . baricitinib  4 mg Oral Daily  . enoxaparin (LOVENOX) injection  40 mg Subcutaneous Q24H  . insulin aspart  0-20 Units Subcutaneous TID WC  . insulin aspart  0-5 Units Subcutaneous QHS  . insulin aspart  10 Units Subcutaneous TID WC  . insulin detemir  35 Units Subcutaneous BID  . linagliptin  5 mg Oral Daily  .  methylPREDNISolone (SOLU-MEDROL) injection  60 mg Intravenous Q12H  . metoprolol tartrate  2.5 mg Intravenous Q6H  . pantoprazole (PROTONIX) IV  40 mg Intravenous Q24H   Continuous Infusions: . dextrose 5 % and 0.45 % NaCl with KCl 20 mEq/L 100 mL/hr at 04/26/20 1317   PRN Meds:.acetaminophen, guaiFENesin-dextromethorphan, haloperidol lactate, hydrOXYzine, LORazepam  Antimicrobials: Anti-infectives (From admission, onward)   Start     Dose/Rate Route Frequency Ordered Stop   04/20/20 1000  remdesivir 100 mg in sodium chloride 0.9 % 100 mL IVPB       "Followed by" Linked Group Details   100 mg 200 mL/hr over 30 Minutes Intravenous Daily 04/19/20 1601 04/23/20 1256   04/20/20 1000  remdesivir 100 mg in sodium chloride 0.9 % 100 mL IVPB  Status:  Discontinued       "Followed by" Linked Group Details   100 mg 200 mL/hr over 30 Minutes Intravenous Daily 04/19/20 1915 04/19/20 1919   04/19/20 1915  remdesivir 200 mg in sodium chloride 0.9% 250 mL IVPB  Status:  Discontinued       "Followed by" Linked Group Details   200 mg  580 mL/hr over 30 Minutes Intravenous Once 04/19/20 1915 04/19/20 1919   04/19/20 1700  remdesivir 200 mg in sodium chloride 0.9% 250 mL IVPB       "Followed by" Linked Group Details   200 mg 580 mL/hr over 30 Minutes Intravenous Once 04/19/20 1601 04/19/20 1814       I have personally reviewed the following labs and images: CBC: Recent Labs  Lab 04/22/20 0519 04/23/20 0444 04/24/20 0444 04/25/20 0517 04/26/20 0544  WBC 12.5* 10.9* 11.0* 11.6* 15.5*  NEUTROABS 11.0* 9.7* 9.5* 10.3* 13.1*  HGB 15.4* 14.4 14.5 14.4 14.8  HCT 47.2* 44.4 43.7 44.4 45.1  MCV 83.4 82.7 81.7 82.7 82.3  PLT 291 221 305 239 328   BMP &GFR Recent Labs  Lab 04/20/20 0605 04/20/20 0605 04/21/20 0450 04/21/20 0450 04/22/20 0519 04/23/20 0444 04/24/20 0444 04/25/20 0517 04/26/20 0544  NA 140   < > 147*   < > 151* 142 141 140 142  K 3.8   < > 4.0   < > 4.2 3.5 4.0 4.3 3.9   CL 104   < > 107   < > 111 110 106 104 105  CO2 23   < > 23   < > 20* 23 21* 23 26  GLUCOSE 237*   < > 284*   < > 268* 137* 326* 269* 117*  BUN 21   < > 29*   < > 30* 25* 20 17 14   CREATININE 0.76   < > 0.92   < > 0.87 0.69 0.67 0.62 0.65  CALCIUM 9.1   < > 9.6   < > 9.9 9.2 9.3 9.2 9.4  MG 2.4  --  2.6*  --  2.7* 2.6* 2.5*  --   --    < > = values in this interval not displayed.   Estimated Creatinine Clearance: 74.6 mL/min (by C-G formula based on SCr of 0.65 mg/dL). Liver & Pancreas: Recent Labs  Lab 04/22/20 0519 04/23/20 0444 04/24/20 0444 04/25/20 0517 04/26/20 0544  AST 24 25 31  32 32  ALT 25 23 27  33 31  ALKPHOS 45 43 42 48 51  BILITOT 0.7 0.9 0.9 1.3* 1.1  PROT 7.3 6.5 6.7 6.4* 6.7  ALBUMIN 3.2* 2.8* 3.0* 2.9* 2.8*   No results for input(s): LIPASE, AMYLASE in the last 168 hours. No results for input(s): AMMONIA in the last 168 hours. Diabetic: No results for input(s): HGBA1C in the last 72 hours. Recent Labs  Lab 04/25/20 1152 04/25/20 1752 04/25/20 2122 04/26/20 0720 04/26/20 1233  GLUCAP 273* 253* 152* 113* 179*   Cardiac Enzymes: No results for input(s): CKTOTAL, CKMB, CKMBINDEX, TROPONINI in the last 168 hours. No results for input(s): PROBNP in the last 8760 hours. Coagulation Profile: No results for input(s): INR, PROTIME in the last 168 hours. Thyroid Function Tests: No results for input(s): TSH, T4TOTAL, FREET4, T3FREE, THYROIDAB in the last 72 hours. Lipid Profile: No results for input(s): CHOL, HDL, LDLCALC, TRIG, CHOLHDL, LDLDIRECT in the last 72 hours. Anemia Panel: Recent Labs    04/25/20 0517 04/26/20 0544  FERRITIN 727* 701*   Urine analysis:    Component Value Date/Time   COLORURINE YELLOW 04/22/2020 0300   APPEARANCEUR CLOUDY (A) 04/22/2020 0300   APPEARANCEUR Hazy 11/11/2014 1041   LABSPEC 1.032 (H) 04/22/2020 0300   LABSPEC 1.023 11/11/2014 1041   PHURINE 5.0 04/22/2020 0300   GLUCOSEU 50 (A) 04/22/2020 0300   GLUCOSEU  >=500 11/11/2014 1041  HGBUR NEGATIVE 04/22/2020 0300   BILIRUBINUR NEGATIVE 04/22/2020 0300   BILIRUBINUR Negative 11/11/2014 1041   KETONESUR 5 (A) 04/22/2020 0300   PROTEINUR 30 (A) 04/22/2020 0300   NITRITE NEGATIVE 04/22/2020 0300   LEUKOCYTESUR NEGATIVE 04/22/2020 0300   LEUKOCYTESUR Negative 11/11/2014 1041   Sepsis Labs: Invalid input(s): PROCALCITONIN, LACTICIDVEN  Microbiology: Recent Results (from the past 240 hour(s))  Blood Culture (routine x 2)     Status: None   Collection Time: 04/19/20 12:23 PM   Specimen: BLOOD  Result Value Ref Range Status   Specimen Description   Final    BLOOD LEFT ANTECUBITAL Performed at [facility], [address], [place], [place] [address]    Special Requests   Final    BOTTLES DRAWN AEROBIC AND ANAEROBIC Blood Culture adequate volume Performed at [facility], [address] [place], [place] [address]    Culture   Final    NO GROWTH 5 DAYS Performed at [facility], [address], [place], [place] [address]    Report Status 04/24/2020 FINAL  Final  Blood Culture (routine x 2)     Status: None   Collection Time: 04/19/20 12:28 PM   Specimen: BLOOD  Result Value Ref Range Status   Specimen Description   Final    BLOOD RIGHT ANTECUBITAL Performed at [facility], [address], [place], [place] [address]    Special Requests   Final    BOTTLES DRAWN AEROBIC AND ANAEROBIC Blood Culture adequate volume Performed at [facility], [address], [place], [place] [address]    Culture   Final    NO GROWTH 5 DAYS Performed at [facility], [address] [place], [place] [address]    Report Status 04/24/2020 FINAL  Final    Radiology Studies: No results found.    Renne Crigler, MD [facility]  If 7PM-7AM, please contact night-coverage [email or web address] 04/26/2020, 1:20 PM

## 2020-04-26 NOTE — Progress Notes (Signed)
OT Cancellation Note  Patient Details Name: DHALIA ZINGARO MRN: [id number] DOB: 1951/06/22   Cancelled Treatment:    Reason Eval/Treat Not Completed: Medical issues which prohibited therapy. Hold per Herbert Seta, Charity fundraiser. Reports patient confused, agitated and unable to follow commands.  Irfan Veal L Shajuana Mclucas 04/26/2020, 9:06 AM

## 2020-04-26 NOTE — Progress Notes (Signed)
PT Cancellation Note  Patient Details Name: Charlene Parker MRN: [id number] DOB: Jun 25, 1951   Cancelled Treatment:    Reason Eval/Treat Not Completed: Other (comment);Patient not medically ready. Hold per Herbert Seta, Charity fundraiser. Reports patient confused, agitated and unable to follow commands.   [facility] 04/26/2020, 9:17 AM

## 2020-04-26 NOTE — Progress Notes (Signed)
Patient BP presenting as elevated, IV metoprolol due at 2200. Patient is unable to communicate due to encephalopathy and cannot follow instructions to be still for vitals monitoring. Patient was thrashing, moaning, and moving during vitals acquisition. Beginning muse protocol, patient given IV atarax per PRN MAR and vitals to be monitored. Charge nurse informed of patient's status.

## 2020-04-27 ENCOUNTER — Inpatient Hospital Stay (HOSPITAL_COMMUNITY): Payer: Managed Care, Other (non HMO)

## 2020-04-27 DIAGNOSIS — R7989 Other specified abnormal findings of blood chemistry: Secondary | ICD-10-CM

## 2020-04-27 LAB — COMPREHENSIVE METABOLIC PANEL
ALT: 25 U/L (ref 0–44)
AST: 18 U/L (ref 15–41)
Albumin: 2.2 g/dL — ABNORMAL LOW (ref 3.5–5.0)
Alkaline Phosphatase: 45 U/L (ref 38–126)
Anion gap: 9 (ref 5–15)
BUN: 12 mg/dL (ref 8–23)
CO2: 23 mmol/L (ref 22–32)
Calcium: 8.7 mg/dL — ABNORMAL LOW (ref 8.9–10.3)
Chloride: 106 mmol/L (ref 98–111)
Creatinine, Ser: 0.6 mg/dL (ref 0.44–1.00)
GFR calc Af Amer: >60 mL/min (ref >60)
GFR calc non Af Amer: >60 mL/min (ref >60)
Glucose, Bld: 198 mg/dL — ABNORMAL HIGH (ref 70–99)
Potassium: 3.9 mmol/L (ref 3.5–5.1)
Sodium: 138 mmol/L (ref 135–145)
Total Bilirubin: 1 mg/dL (ref 0.3–1.2)
Total Protein: 5.5 g/dL — ABNORMAL LOW (ref 6.5–8.1)

## 2020-04-27 LAB — D-DIMER, QUANTITATIVE: D-Dimer, Quant: 1.61 ug/mL-FEU — ABNORMAL HIGH (ref 0.00–0.50)

## 2020-04-27 LAB — CBC WITH DIFFERENTIAL/PLATELET
Abs Immature Granulocytes: 0.26 10*3/uL — ABNORMAL HIGH (ref 0.00–0.07)
Basophils Absolute: 0 10*3/uL (ref 0.0–0.1)
Basophils Relative: 0 %
Eosinophils Absolute: 0 10*3/uL (ref 0.0–0.5)
Eosinophils Relative: 0 %
HCT: 41.6 % (ref 36.0–46.0)
Hemoglobin: 13.5 g/dL (ref 12.0–15.0)
Immature Granulocytes: 2 %
Lymphocytes Relative: 4 %
Lymphs Abs: 0.5 10*3/uL — ABNORMAL LOW (ref 0.7–4.0)
MCH: 27.2 pg (ref 26.0–34.0)
MCHC: 32.5 g/dL (ref 30.0–36.0)
MCV: 83.7 fL (ref 80.0–100.0)
Monocytes Absolute: 0.2 10*3/uL (ref 0.1–1.0)
Monocytes Relative: 2 %
Neutro Abs: 10.4 10*3/uL — ABNORMAL HIGH (ref 1.7–7.7)
Neutrophils Relative %: 92 %
Platelets: 239 10*3/uL (ref 150–400)
RBC: 4.97 MIL/uL (ref 3.87–5.11)
RDW: 13.2 % (ref 11.5–15.5)
WBC: 11.3 10*3/uL — ABNORMAL HIGH (ref 4.0–10.5)
nRBC: 0 % (ref 0.0–0.2)

## 2020-04-27 LAB — C-REACTIVE PROTEIN: CRP: 9.6 mg/dL — ABNORMAL HIGH (ref <1.0)

## 2020-04-27 LAB — GLUCOSE, CAPILLARY
Glucose-Capillary: 209 mg/dL — ABNORMAL HIGH (ref 70–99)
Glucose-Capillary: 215 mg/dL — ABNORMAL HIGH (ref 70–99)
Glucose-Capillary: 97 mg/dL (ref 70–99)
Glucose-Capillary: 78 mg/dL (ref 70–99)

## 2020-04-27 LAB — FERRITIN: Ferritin: 482 ng/mL — ABNORMAL HIGH (ref 11–307)

## 2020-04-27 MED ORDER — INSULIN DETEMIR 100 UNIT/ML ~~LOC~~ SOLN
40.0000 [IU] | Freq: Two times a day (BID) | SUBCUTANEOUS | Status: DC
  Administered 2020-04-27 – 2020-04-29 (×5): 40 [IU] via SUBCUTANEOUS
  Filled 2020-04-27 (×6): qty 0.4

## 2020-04-27 MED ORDER — PROSOURCE PLUS PO LIQD: 30.0000 mL | Freq: Two times a day (BID) | ORAL | Status: DC

## 2020-04-27 MED ORDER — ADULT MULTIVITAMIN W/MINERALS CH
1.0000 | ORAL_TABLET | Freq: Every day | ORAL | Status: DC
  Administered 2020-04-28 – 2020-04-30 (×3): 1 via ORAL
  Filled 2020-04-27 (×3): qty 1

## 2020-04-27 NOTE — Progress Notes (Signed)
Pt's husband called and asked about update on pt. He has questions for MD. MD aware.

## 2020-04-27 NOTE — Progress Notes (Addendum)
PROGRESS NOTE  Charlene Parker YKZ:[id number] DOB: Dec 29, 1950   PCP: Jaclyn Shaggy, MD  Patient is from: Home.  DOA: 04/19/2020 LOS: 8  Brief Narrative / Interim history: 69 year old female with history of DM-2, HTN, GERD and morbid obesity presenting from infusion clinic with hypoxemia to 84% and shortness of breath.  Patient had her first Moderna vaccine on 8/23.  Developed Covid-like symptoms on 8/27 and tested positive for COVID-19 on 8/28.  Presented to [facility] on 9/3 with SOB, cough, substernal chest pain and generalized weakness.  CXR compatible with multifocal pneumonia in the setting of COVID-19.  Reportedly maintained appropriate saturation with ambulation and discharged home.  She was directed to infusion clinic for monoclonal antibody.   Husband with COVID-19 infection at home.  In ED, confused.  CBG 59.  ABG without significant finding.  Saturating at 81% on RA requiring 6 L. K3.0.  CRP 19.4.  Other inflammatory markers elevated.  Lactic acid 2.3>> 1.9.  Pro-Cal negative.  CXR consistent with multifocal pneumonia.  Received Decadron and remdesivir in ED.  Admitted for acute hypoxemic respiratory failure due to COVID-19 infection, acute metabolic encephalopathy and hypoglycemia.  She was a started on baricitinib, Solu-Medrol and remdesivir.  She is a still on 50 L by HFNC.  She remains confused/disoriented requiring chemical and physical restraints as she continued to try to climb out of the bed, pull off oxygen and IV lines.  Assessment & Plan: Acute hypoxemic respiratory failure due to COVID-19 pneumonia/infection-progressive symptoms since 8/27.  Tested positive on 8/28.  Desaturated as low as 81% on RA.  CXR with bilateral infiltrates.  Inflammatory markers mixed/upgoing and downgoing.  Saturation ranges from 88 to 93% on 15 L by HFNC.  Patient's D-dimer has now stabilized.  She is ruled out of PE and DVT. -Remdesivir 9/5-9/9 -Continue baricitinib and Solu-Medrol 9/5>>> -Continue  supportive care with inhalers, mucolytic's, antitussive, vitamin C/zinc, incentive spirometry.  She is currently on 14 L of high flow nasal cannula oxygen. -Continue monitoring inflammatory markers -PT/OT   Acute metabolic encephalopathy due to COVID-19 infection: still confused, disoriented and but not as agitated.  No apparent focal neuro deficit.  ABG without significant finding. No significant response to as needed Haldol.  -Treat COVID-19 infection as above. -Reorientation and delirium precautions. -Continue IV Haldol and Ativan as needed but discontinue Vistaril. -Continue bilateral wrist restraints when needed.  Now that patient has been encephalopathic for several days.  No CT head in chart.  I will proceed with CT head without contrast to rule out any other unknown intracranial pathology.  Uncontrolled DM-2 with hyperglycemia and hypoglycemia-on insulin, Jardiance, Trulicity and Metformin at home.  A1c 7.2%.  Blood sugar much better controlled but still slightly elevated.  Will increase Levemir to 40 units twice daily and continue Tradjenta and NovoLog 10 units AC and SSI.  Hypokalemia: Resolved. -Monitor and replenish as appropriate  Hypernatremia: Na 147> 151> 141.  Resolved. -Continue D5-1/2NS-KCl as she is not taking anything p.o.  Moderate to severe protein calorie malnutrition: Reportedly patient has not taken anything oral for last 7 days due to being incoherent.  Nutrition consulted.  They recommended starting tube feedings.  We will initiate this.  I expect her to have more hypoglycemia with this.  Essential hypertension: Normotensive. -IV metoprolol 2.5 mg every 6 hours  GERD-does not seem to be on meds at home. -Continue IV Protonix  Prolonged QTC-499 on initial EKG. 451 on repeat EKG. -Monitor K and Mg and optimize  Morbid  obesity: BMI 37.73 in patient with uncontrolled diabetes. -Encourage lifestyle change to lose weight    DVT prophylaxis:  enoxaparin  (LOVENOX) injection 40 mg Start: 04/19/20 2200  Code Status: Full code Family Communication: Left voicemail to patient's husband's phone twice today. Status is: Inpatient  Remains inpatient appropriate because:Altered mental status, IV treatments appropriate due to intensity of illness or inability to take PO and Inpatient level of care appropriate due to severity of illness.     Dispo: The patient is from: Home              Anticipated d/c is to: Home              Anticipated d/c date is: > 3 days              Patient currently is not medically stable to d/c.       Consultants:  None   Subjective: Patient seen and examined.  No change compared to yesterday.  Remains encephalopathic.  Unable to communicate anything.  Moans and withdraws.  Objective: Vitals:   04/26/20 2107 04/27/20 0410 04/27/20 1203 04/27/20 1335  BP: (!) 155/81 (!) 150/78  (!) 139/106  Pulse: 70 85  92  Resp: 18 20  (!) 22  Temp: 97.9 F (36.6 C) 98.3 F (36.8 C)  (!) 97.3 F (36.3 C)  TempSrc:      SpO2: 92% 93% 100% 100%  Weight:      Height:        Intake/Output Summary (Last 24 hours) at 04/27/2020 1346 Last data filed at 04/26/2020 1856 Gross per 24 hour  Intake 400 ml  Output --  Net 400 ml   Filed Weights   04/23/20 0010 04/25/20 0140  Weight: 97 kg 97 kg    Examination:  General exam: Very lethargic Respiratory system: Diminished breath sounds at the bases. Respiratory effort normal. Cardiovascular system: S1 & S2 heard, RRR. No JVD, murmurs, rubs, gallops or clicks. No pedal edema. Gastrointestinal system: Abdomen is nondistended, soft and nontender. No organomegaly or masses felt. Normal bowel sounds heard. Central nervous system: Too lethargic to perform examination. Skin: No rashes, lesions or ulcers.     Procedures:  None  Microbiology summarized: COVID-19 PCR positive.   Sch Meds:  Scheduled Meds: . (feeding supplement) PROSource Plus  30 mL Oral BID BM  .  albuterol  2 puff Inhalation TID  . baricitinib  4 mg Oral Daily  . enoxaparin (LOVENOX) injection  40 mg Subcutaneous Q24H  . insulin aspart  0-20 Units Subcutaneous TID WC  . insulin aspart  0-5 Units Subcutaneous QHS  . insulin aspart  10 Units Subcutaneous TID WC  . insulin detemir  35 Units Subcutaneous BID  . linagliptin  5 mg Oral Daily  . methylPREDNISolone (SOLU-MEDROL) injection  60 mg Intravenous Q12H  . metoprolol tartrate  2.5 mg Intravenous Q6H  . multivitamin with minerals  1 tablet Oral Daily  . pantoprazole (PROTONIX) IV  40 mg Intravenous Q24H   Continuous Infusions: . dextrose 5 % and 0.45 % NaCl with KCl 20 mEq/L 100 mL/hr at 04/27/20 1021   PRN Meds:.acetaminophen, guaiFENesin-dextromethorphan, haloperidol lactate, LORazepam  Antimicrobials: Anti-infectives (From admission, onward)   Start     Dose/Rate Route Frequency Ordered Stop   04/20/20 1000  remdesivir 100 mg in sodium chloride 0.9 % 100 mL IVPB       "Followed by" Linked Group Details   100 mg 200 mL/hr over 30 Minutes Intravenous Daily  04/19/20 1601 04/23/20 1256   04/20/20 1000  remdesivir 100 mg in sodium chloride 0.9 % 100 mL IVPB  Status:  Discontinued       "Followed by" Linked Group Details   100 mg 200 mL/hr over 30 Minutes Intravenous Daily 04/19/20 1915 04/19/20 1919   04/19/20 1915  remdesivir 200 mg in sodium chloride 0.9% 250 mL IVPB  Status:  Discontinued       "Followed by" Linked Group Details   200 mg 580 mL/hr over 30 Minutes Intravenous Once 04/19/20 1915 04/19/20 1919   04/19/20 1700  remdesivir 200 mg in sodium chloride 0.9% 250 mL IVPB       "Followed by" Linked Group Details   200 mg 580 mL/hr over 30 Minutes Intravenous Once 04/19/20 1601 04/19/20 1814       I have personally reviewed the following labs and images: CBC: Recent Labs  Lab 04/23/20 0444 04/24/20 0444 04/25/20 0517 04/26/20 0544 04/27/20 0452  WBC 10.9* 11.0* 11.6* 15.5* 11.3*  NEUTROABS 9.7* 9.5*  10.3* 13.1* 10.4*  HGB 14.4 14.5 14.4 14.8 13.5  HCT 44.4 43.7 44.4 45.1 41.6  MCV 82.7 81.7 82.7 82.3 83.7  PLT 221 305 239 328 239   BMP &GFR Recent Labs  Lab 04/21/20 0450 04/21/20 0450 04/22/20 0519 04/22/20 0519 04/23/20 0444 04/24/20 0444 04/25/20 0517 04/26/20 0544 04/27/20 0452  NA 147*   < > 151*   < > 142 141 140 142 138  K 4.0   < > 4.2   < > 3.5 4.0 4.3 3.9 3.9  CL 107   < > 111   < > 110 106 104 105 106  CO2 23   < > 20*   < > 23 21* GLUCOSE 284*   < > 268*   < > 137* 326* 269* 117* 198*  BUN 29*   < > 30*   < > 25* CREATININE 0.92   < > 0.87   < > 0.69 0.67 0.62 0.65 0.60  CALCIUM 9.6   < > 9.9   < > 9.2 9.3 9.2 9.4 8.7*  MG 2.6*  --  2.7*  --  2.6* 2.5*  --   --   --    < > = values in this interval not displayed.   Estimated Creatinine Clearance: 74.6 mL/min (by C-G formula based on SCr of 0.6 mg/dL). Liver & Pancreas: Recent Labs  Lab 04/23/20 0444 04/24/20 0444 04/25/20 0517 04/26/20 0544 04/27/20 0452  AST 25 31 32 32 18  ALT 23 27 33 31 25  ALKPHOS 43 42 48 51 45  BILITOT 0.9 0.9 1.3* 1.1 1.0  PROT 6.5 6.7 6.4* 6.7 5.5*  ALBUMIN 2.8* 3.0* 2.9* 2.8* 2.2*   No results for input(s): LIPASE, AMYLASE in the last 168 hours. No results for input(s): AMMONIA in the last 168 hours. Diabetic: No results for input(s): HGBA1C in the last 72 hours. Recent Labs  Lab 04/26/20 1233 04/26/20 1617 04/26/20 2104 04/27/20 0847 04/27/20 1119  GLUCAP 179* 164* 144* 209* 215*   Cardiac Enzymes: No results for input(s): CKTOTAL, CKMB, CKMBINDEX, TROPONINI in the last 168 hours. No results for input(s): PROBNP in the last 8760 hours. Coagulation Profile: No results for input(s): INR, PROTIME in the last 168 hours. Thyroid Function Tests: No results for input(s): TSH, T4TOTAL, FREET4, T3FREE, THYROIDAB in the last 72 hours. Lipid Profile: No results for input(s): CHOL, HDL, LDLCALC, TRIG,  CHOLHDL, LDLDIRECT in the last 72  hours. Anemia Panel: Recent Labs    04/26/20 0544 04/27/20 0452  FERRITIN 701* 482*   Urine analysis:    Component Value Date/Time   COLORURINE YELLOW 04/22/2020 0300   APPEARANCEUR CLOUDY (A) 04/22/2020 0300   APPEARANCEUR Hazy 11/11/2014 1041   LABSPEC 1.032 (H) 04/22/2020 0300   LABSPEC 1.023 11/11/2014 1041   PHURINE 5.0 04/22/2020 0300   GLUCOSEU 50 (A) 04/22/2020 0300   GLUCOSEU >=500 11/11/2014 1041   HGBUR NEGATIVE 04/22/2020 0300   BILIRUBINUR NEGATIVE 04/22/2020 0300   BILIRUBINUR Negative 11/11/2014 1041   KETONESUR 5 (A) 04/22/2020 0300   PROTEINUR 30 (A) 04/22/2020 0300   NITRITE NEGATIVE 04/22/2020 0300   LEUKOCYTESUR NEGATIVE 04/22/2020 0300   LEUKOCYTESUR Negative 11/11/2014 1041   Sepsis Labs: Invalid input(s): PROCALCITONIN, LACTICIDVEN  Microbiology: Recent Results (from the past 240 hour(s))  Blood Culture (routine x 2)     Status: None   Collection Time: 04/19/20 12:23 PM   Specimen: BLOOD  Result Value Ref Range Status   Specimen Description   Final    BLOOD LEFT ANTECUBITAL Performed at [facility], [address] [place], [place] [address]    Special Requests   Final    BOTTLES DRAWN AEROBIC AND ANAEROBIC Blood Culture adequate volume Performed at [facility], [address] [place], [place] [address]    Culture   Final    NO GROWTH 5 DAYS Performed at [facility], [address] [place], [place] [address]    Report Status 04/24/2020 FINAL  Final  Blood Culture (routine x 2)     Status: None   Collection Time: 04/19/20 12:28 PM   Specimen: BLOOD  Result Value Ref Range Status   Specimen Description   Final    BLOOD RIGHT ANTECUBITAL Performed at [facility], [address], [place], [place] [address]    Special Requests   Final    BOTTLES DRAWN AEROBIC AND ANAEROBIC Blood Culture adequate volume Performed at [facility], [address],  [place], [place] [address]    Culture   Final    NO GROWTH 5 DAYS Performed at [facility], [address], [place], [place] [address]    Report Status 04/24/2020 FINAL  Final    Radiology Studies: CT ANGIO CHEST PE W OR WO CONTRAST  Result Date: 04/26/2020 CLINICAL DATA:  Proxy Mia, elevated D-dimer, COVID-19 positive, on 15 L of O2, question pulmonary embolism EXAM: CT ANGIOGRAPHY CHEST WITH CONTRAST TECHNIQUE: Multidetector CT imaging of the chest was performed using the standard protocol during bolus administration of intravenous contrast. Multiplanar CT image reconstructions and MIPs were obtained to evaluate the vascular anatomy. Examination degraded by patient motion artifacts. CONTRAST:  OMNIPAQUE IOHEXOL 350 MG/ML SOLN IV COMPARISON:  None FINDINGS: Cardiovascular: Aorta normal caliber. No gross evidence of aneurysm or dissection. Heart unremarkable. No pericardial effusion. Examination is nondiagnostic for presence of pulmonary embolism due to motion artifacts. No large central pulmonary emboli are identified but significant portions of the pulmonary arterial tree bilaterally are unable to be adequately assessed. Mediastinum/Nodes: Esophagus unremarkable. Base of cervical region normal appearance. No thoracic adenopathy grossly identified. Lungs/Pleura: Patchy BILATERAL airspace infiltrates consistent with multifocal pneumonia and COVID-19. No gross pleural effusion, pneumothorax or mass within limitations of motion. Upper Abdomen: Mass at anterior aspect LEFT lobe liver 2.8 x 2.8 cm, low attenuation, question cyst though requiring confirmation by ultrasound. Streak  artifacts from arms traverse liver and spleen. Gallbladder surgically absent. Musculoskeletal: No gross osseous findings. Review of the MIP images confirms the above findings. IMPRESSION: Nondiagnostic exam for presence of pulmonary embolism due to significant patient and respiratory motion artifacts; no large central  pulmonary emboli identified. Extensive BILATERAL pulmonary infiltrates consistent with multifocal pneumonia and COVID-19. 2.8 x 2.8 cm diameter lesion at anterior LEFT lobe liver, likely cyst, recommend non emergent ultrasound characterization. Electronically Signed   By: Ulyses SouthwardMark  Boles M.D.   On: 04/26/2020 14:50   VAS US LOWER EXTREMITY VENOUS (DVT)  Result Date: 04/27/2020  Lower Venous DVTStudy Indications: Elevated Ddimer.  Risk Factors: COVID 19 positive. Limitations: Body habitus, poor ultrasound/tissue interface and patient constant movement, patient positioning, poor patient cooperation. Comparison Study: No prior studies. Performing Technologist: Chanda BusingGregory Collins RVT  Examination Guidelines: A complete evaluation includes B-mode imaging, spectral Doppler, color Doppler, and power Doppler as needed of all accessible portions of each vessel. Bilateral testing is considered an integral part of a complete examination. Limited examinations for reoccurring indications may be performed as noted. The reflux portion of the exam is performed with the patient in reverse Trendelenburg.  +---------+---------------+---------+-----------+----------+--------------+ RIGHT    CompressibilityPhasicitySpontaneityPropertiesThrombus Aging +---------+---------------+---------+-----------+----------+--------------+ CFV      Full           Yes      Yes                                 +---------+---------------+---------+-----------+----------+--------------+ SFJ      Full                                                        +---------+---------------+---------+-----------+----------+--------------+ FV Prox  Full                                                        +---------+---------------+---------+-----------+----------+--------------+ FV Mid   Full                                                        +---------+---------------+---------+-----------+----------+--------------+ FV  DistalFull                                                        +---------+---------------+---------+-----------+----------+--------------+ PFV      Full                                                        +---------+---------------+---------+-----------+----------+--------------+ POP      Full           Yes      Yes                                 +---------+---------------+---------+-----------+----------+--------------+  PTV      Full                                                        +---------+---------------+---------+-----------+----------+--------------+ PERO     Full                                                        +---------+---------------+---------+-----------+----------+--------------+   +---------+---------------+---------+-----------+----------+--------------+ LEFT     CompressibilityPhasicitySpontaneityPropertiesThrombus Aging +---------+---------------+---------+-----------+----------+--------------+ CFV      Full           Yes      Yes                                 +---------+---------------+---------+-----------+----------+--------------+ SFJ      Full                                                        +---------+---------------+---------+-----------+----------+--------------+ FV Prox  Full                                                        +---------+---------------+---------+-----------+----------+--------------+ FV Mid   Full                                                        +---------+---------------+---------+-----------+----------+--------------+ FV DistalFull                                                        +---------+---------------+---------+-----------+----------+--------------+ PFV      Full                                                        +---------+---------------+---------+-----------+----------+--------------+ POP      Full                                                         +---------+---------------+---------+-----------+----------+--------------+ PTV  Not visualized +---------+---------------+---------+-----------+----------+--------------+ PERO                                                  Not visualized +---------+---------------+---------+-----------+----------+--------------+ Unable to obtain doppler images of the popliteal vein waveforms due to constant patient movement. Unable to obtain images of the posterior tibial and peroneal veins due to constant patient movement.    Summary: RIGHT: - There is no evidence of deep vein thrombosis in the lower extremity. However, portions of this examination were limited- see technologist comments above.  - No cystic structure found in the popliteal fossa.  LEFT: - There is no evidence of deep vein thrombosis in the lower extremity. However, portions of this examination were limited- see technologist comments above.  - No cystic structure found in the popliteal fossa.  *See table(s) above for measurements and observations. Electronically signed by Lemar Livings MD on 04/27/2020 at 1:01:45 PM.    Final       Renne Crigler, MD [facility]  If 7PM-7AM, please contact night-coverage [email or web address] 04/27/2020, 1:46 PM

## 2020-04-27 NOTE — Progress Notes (Signed)
Occupational Therapy Treatment Patient Details Name: Charlene Parker MRN: [id number] DOB: 1951-04-21 Today's Date: 04/27/2020    History of present illness Eleonor Ocon Alcindor is a 69 y.o. female with PMHx HTN, GERD, Diabetes who presents to the ED today via EMS for SOB. Pt received her first dose of Moderna vaccine on Monday 08/23; she began developing COVID like symptoms on 08/27 then tested positive on 08/28. Pt went to [facility] ED on 09/03 for SOB, generalized weakness, substernal chest pain/epigastric pain, and cough. She was treated with LR after being found to be tachycardic. She was well appearing afterwards and was discharged home. Pt went to the Infusion Clinic 9/5 and found to be hypoxic at 84% on RA and confused. Pt [facility] vis EMS to [facility].   OT comments  Patient unable to demonstrate any progress towards OT goals compared to previous session. Patient limited by confusion, inability to follow any commands, keeping eyes closed with consistent moaning, and restlessness in bed, along with deficits noted below. Pt currently has fair  rehab potential and could benefit from continued skilled OT to increase safety and independence with ADLs and functional transfers to allow pt to return home safely and reduce caregiver burden and fall risk.   Follow Up Recommendations  SNF;Supervision/Assistance - 24 hour    Equipment Recommendations       Recommendations for Other Services      Precautions / Restrictions Precautions Precautions: Fall Precaution Comments: AMS, Airborn/Contact Restrictions Weight Bearing Restrictions: No       Mobility Bed Mobility    Unable to safely assess              Transfers    Unable to safely assess                     Balance                                           ADL either performed or assessed with clinical judgement   ADL Overall ADL's : Needs assistance/impaired     Grooming: Total  assistance;Bed level Grooming Details (indicate cue type and reason): attempted to have patient wash her face however does not initiate, continues to moan.  Pt assisted with opening eyes as they were sealed shut with eye discharge.  Pt assisted with warm washcloth and eyes/lids cleaned. Pt's face gently washed with attempts to have pt attend to activity without success.                   Toilet Transfer Details (indicate cue type and reason): unable to assess safely           General ADL Comments: patient will move on own volition in bed, very restless however will not follow directions to safely perform bed mobility or functional tasks     Vision   Additional Comments: Pt not opening eyes to command. Pt assisted to open eyes with unfocused gaze and no eye contact.   Perception     Praxis      Cognition Arousal/Alertness: Awake/alert Behavior During Therapy: Restless Overall Cognitive Status: No family/caregiver present to determine baseline cognitive functioning Area of Impairment: Orientation;Attention;Following commands;Safety/judgement;Awareness                 Orientation Level: Disoriented to;Place;Time;Situation Current Attention Level: Focused   Following Commands: Follows one step  commands inconsistently (Follows no commands) Safety/Judgement: Decreased awareness of safety;Decreased awareness of deficits Awareness: Intellectual   General Comments: patient not following 1 step directions for bed mobility or movement of UEs.        Exercises     Shoulder Instructions       General Comments      Pertinent Vitals/ Pain       Pain Location: Pt groaning and agitated but unable to to indicate pain. Pain Intervention(s): Monitored during session  Home Living                                          Prior Functioning/Environment              Frequency  Min 2X/week        Progress Toward Goals  OT Goals(current goals  can now be found in the care plan section)  Progress towards OT goals: Not progressing toward goals - comment (see assessment in note)  Acute Rehab OT Goals OT Goal Formulation: Patient unable to participate in goal setting Time For Goal Achievement: 05/06/20 Potential to Achieve Goals: Fair  Plan Discharge plan remains appropriate    Co-evaluation                 AM-PAC OT "6 Clicks" Daily Activity     Outcome Measure   Help from another person eating meals?: Total Help from another person taking care of personal grooming?: Total Help from another person toileting, which includes using toliet, bedpan, or urinal?: Total Help from another person bathing (including washing, rinsing, drying)?: Total Help from another person to put on and taking off regular upper body clothing?: Total Help from another person to put on and taking off regular lower body clothing?: Total 6 Click Score: 6    End of Session Equipment Utilized During Treatment: Oxygen (HFNC)  OT Visit Diagnosis: Other abnormalities of gait and mobility (R26.89);Other symptoms and signs involving cognitive function   Activity Tolerance Treatment limited secondary to agitation;Other (comment)   Patient Left in bed;with call bell/phone within reach   Nurse Communication Other (comment) (Current restlessness; following no commands.)        Time: 7673-4193 OT Time Calculation (min): 10 min  Charges: OT General Charges $OT Visit: 1 Visit OT Treatments $Self Care/Home Management : 8-22 mins  Victorino Dike, OT Acute Rehab Services Office: [phone] 04/27/2020    Theodoro Clock 04/27/2020, 11:23 AM

## 2020-04-27 NOTE — Progress Notes (Addendum)
Initial Nutrition Assessment  RD working remotely.  DOCUMENTATION CODES:   Obesity unspecified  INTERVENTION:  - will order Hormel Shake once/day, each supplement provides 500 kcal and 22 grams protein. - will order 30 mL Prosource Plus BID, each supplement provides 100 kcal and 15 grams of protein. - will order 1 tablet multivitamin with minerals/day.  - patient has been admitted x1 week and has eaten nothing to very little since that time. Highly recommend small bore NGT placement at bedside and initiation of TF.    NUTRITION DIAGNOSIS:   Increased nutrient needs related to acute illness, catabolic illness (COVID-19 infection) as evidenced by estimated needs  GOAL:   Patient will meet greater than or equal to 90% of their needs  MONITOR:   PO intake, Supplement acceptance, Labs, Weight trends  REASON FOR ASSESSMENT:   Consult Assessment of nutrition requirement/status  ASSESSMENT:   69 year old female with a medical history of type 2 DM, HTN, GERD, and morbid obesity. She presented to the ED from the COVID infusion clinic with hypoxemia to 84% and shortness of breath. Patient had her first Moderna vaccine on 8/23, developed Covid-like symptoms on 8/27, and tested positive for COVID-19 on 8/28. She presented to [facility] on 9/3 with SOB, cough, substernal chest pain and generalized weakness. Her husband has also been found to be COVID-19 positive. She was admitted for acute hypoxemic respiratory failure due to COVID-19 infection, acute metabolic encephalopathy, and hypoglycemia. She was on 50L HFNC at the time of admission.  Flow sheet documentation indicates that patient is disoriented x4; did not enter patient's room as it was felt that no meaningful information would be able to be obtained.   Flow sheet documentation indicates that patient consumed 0% of all meals on 9/10; 0% of breakfast and lunch on 9/11 (no documentation on dinner); 0% of lunch and dinner on 9/12 (no  documentation on breakfast).   Weight on 9/9 and 9/11 both documented at 214 lb. Weight appears to have been stable since 01/2019.    Labs reviewed; CBG: 209 mg/dl, Ca: 8.7 mg/dl. Medications reviewed; sliding scale novolog, 10 units novolog TID, 35 units levemir BID, 5 mg tradjenta/day, 60 mg solu-medrol BID, 40 mg IV protonix/day.  IVF; D5-1/2 NS-20 mEq KCl 2 100 ml/hr (408 kcal).     NUTRITION - FOCUSED PHYSICAL EXAM:  unable to complete at this time.   Diet Order:   Diet Order            Diet Carb Modified Fluid consistency: Thin; Room service appropriate? Yes  Diet effective now                 EDUCATION NEEDS:   No education needs have been identified at this time  Skin:  Skin Assessment: Skin Integrity Issues: Skin Integrity Issues:: Other (Comment) Other: MASD to bilateral lower breasts  Last BM:  PTA/unknown  Height:   Ht Readings from Last 1 Encounters:  04/25/20 5\' 3"  (1.6 m)    Weight:   Wt Readings from Last 1 Encounters:  04/25/20 97 kg     Estimated Nutritional Needs:  Kcal:  1800-2000 kcal Protein:  80-90 grams Fluid:  >/= 1.8 L/day     06/25/20, MS, RD, LDN, CNSC Inpatient Clinical Dietitian RD pager # available in AMION  After hours/weekend pager # available in [facility]

## 2020-04-27 NOTE — Progress Notes (Signed)
Bilateral lower extremity venous duplex has been completed. Preliminary results can be found in CV Proc through chart review.   04/27/20 10:13 AM Olen Cordial RVT

## 2020-04-28 ENCOUNTER — Inpatient Hospital Stay (HOSPITAL_COMMUNITY): Payer: Managed Care, Other (non HMO)

## 2020-04-28 ENCOUNTER — Encounter (HOSPITAL_COMMUNITY): Payer: Self-pay | Admitting: Student

## 2020-04-28 LAB — CBC WITH DIFFERENTIAL/PLATELET
Abs Immature Granulocytes: 0.34 10*3/uL — ABNORMAL HIGH (ref 0.00–0.07)
Basophils Absolute: 0.1 10*3/uL (ref 0.0–0.1)
Basophils Relative: 0 %
Eosinophils Absolute: 0 10*3/uL (ref 0.0–0.5)
Eosinophils Relative: 0 %
HCT: 46.3 % — ABNORMAL HIGH (ref 36.0–46.0)
Hemoglobin: 15.2 g/dL — ABNORMAL HIGH (ref 12.0–15.0)
Immature Granulocytes: 2 %
Lymphocytes Relative: 3 %
Lymphs Abs: 0.5 10*3/uL — ABNORMAL LOW (ref 0.7–4.0)
MCH: 27 pg (ref 26.0–34.0)
MCHC: 32.8 g/dL (ref 30.0–36.0)
MCV: 82.4 fL (ref 80.0–100.0)
Monocytes Absolute: 0.2 10*3/uL (ref 0.1–1.0)
Monocytes Relative: 1 %
Neutro Abs: 15 10*3/uL — ABNORMAL HIGH (ref 1.7–7.7)
Neutrophils Relative %: 94 %
Platelets: 238 10*3/uL (ref 150–400)
RBC: 5.62 MIL/uL — ABNORMAL HIGH (ref 3.87–5.11)
RDW: 13.3 % (ref 11.5–15.5)
WBC: 16 10*3/uL — ABNORMAL HIGH (ref 4.0–10.5)
nRBC: 0 % (ref 0.0–0.2)

## 2020-04-28 LAB — COMPREHENSIVE METABOLIC PANEL
ALT: 26 U/L (ref 0–44)
AST: 24 U/L (ref 15–41)
Albumin: 2.6 g/dL — ABNORMAL LOW (ref 3.5–5.0)
Alkaline Phosphatase: 52 U/L (ref 38–126)
Anion gap: 11 (ref 5–15)
BUN: 10 mg/dL (ref 8–23)
CO2: 21 mmol/L — ABNORMAL LOW (ref 22–32)
Calcium: 8.9 mg/dL (ref 8.9–10.3)
Chloride: 105 mmol/L (ref 98–111)
Creatinine, Ser: 0.58 mg/dL (ref 0.44–1.00)
GFR calc Af Amer: >60 mL/min (ref >60)
GFR calc non Af Amer: >60 mL/min (ref >60)
Glucose, Bld: 105 mg/dL — ABNORMAL HIGH (ref 70–99)
Potassium: 4.2 mmol/L (ref 3.5–5.1)
Sodium: 137 mmol/L (ref 135–145)
Total Bilirubin: 1 mg/dL (ref 0.3–1.2)
Total Protein: 6.3 g/dL — ABNORMAL LOW (ref 6.5–8.1)

## 2020-04-28 LAB — GLUCOSE, CAPILLARY
Glucose-Capillary: 123 mg/dL — ABNORMAL HIGH (ref 70–99)
Glucose-Capillary: 133 mg/dL — ABNORMAL HIGH (ref 70–99)
Glucose-Capillary: 140 mg/dL — ABNORMAL HIGH (ref 70–99)
Glucose-Capillary: 148 mg/dL — ABNORMAL HIGH (ref 70–99)
Glucose-Capillary: 196 mg/dL — ABNORMAL HIGH (ref 70–99)

## 2020-04-28 LAB — PROCALCITONIN: Procalcitonin: <0.1 ng/mL

## 2020-04-28 LAB — FERRITIN: Ferritin: 539 ng/mL — ABNORMAL HIGH (ref 11–307)

## 2020-04-28 LAB — MAGNESIUM: Magnesium: 2.3 mg/dL (ref 1.7–2.4)

## 2020-04-28 LAB — D-DIMER, QUANTITATIVE: D-Dimer, Quant: 2.28 ug/mL-FEU — ABNORMAL HIGH (ref 0.00–0.50)

## 2020-04-28 LAB — PHOSPHORUS: Phosphorus: 2.6 mg/dL (ref 2.5–4.6)

## 2020-04-28 LAB — C-REACTIVE PROTEIN: CRP: 6.3 mg/dL — ABNORMAL HIGH (ref <1.0)

## 2020-04-28 MED ORDER — FREE WATER
100.0000 mL | Status: DC
  Administered 2020-04-28 – 2020-05-01 (×18): 100 mL

## 2020-04-28 MED ORDER — GLUCERNA 1.5 CAL PO LIQD
1000.0000 mL | ORAL | Status: DC
  Administered 2020-04-28 – 2020-05-01 (×4): 1000 mL
  Filled 2020-04-28 (×6): qty 1000

## 2020-04-28 MED ORDER — GLUCERNA 1.2 CAL PO LIQD
1000.0000 mL | ORAL | Status: DC
  Filled 2020-04-28: qty 1000

## 2020-04-28 MED ORDER — VITAL HIGH PROTEIN PO LIQD: 1000.0000 mL | ORAL | Status: DC

## 2020-04-28 NOTE — Progress Notes (Signed)
Physical Therapy Treatment / Discharge Patient Details Name: Charlene Parker MRN: [id number] DOB: [birth year] Today's Date: 04/28/2020    History of Present Illness Charlene Parker is a 69 y.o. female with PMHx HTN, GERD, Diabetes who presents to the ED today via EMS for SOB. Pt received her first dose of Moderna vaccine on Monday 08/23; she began developing COVID like symptoms on 08/27 then tested positive on 08/28. Pt went to [facility] ED on 09/03 for SOB, generalized weakness, substernal chest pain/epigastric pain, and cough. She was treated with LR after being found to be tachycardic. She was well appearing afterwards and was discharged home. Pt went to the Infusion Clinic 9/5 and found to be hypoxic at 84% on RA and confused. Pt transferred vis EMS to [facility].    PT Comments    Pt not following commands or assisting with movement.  Provided some ROM to LEs and pt not assisting and at times resisting movement.  Pt repositioned in bed.  Pt occasionally opens eyes and groans.  Pt remains with decreased cognition and unable to participate in therapy at this time.  PT to sign off since pt not progressing.  Please re-order if/when pt able to participate.   Follow Up Recommendations  SNF;Supervision/Assistance - 24 hour     Equipment Recommendations  Other (comment) (unable to recommend at this time)    Recommendations for Other Services       Precautions / Restrictions Precautions Precautions: Fall Precaution Comments: restraints, mittens, NG tube    Mobility  Bed Mobility Overal bed mobility: Needs Assistance             General bed mobility comments: total assist to reposition as pt slid down in bed  Transfers                 General transfer comment: deferred d/t safety and AMS  Ambulation/Gait                 Stairs             Wheelchair Mobility    Modified Rankin (Stroke Patients Only)       Balance                                             Cognition Arousal/Alertness: Lethargic Behavior During Therapy: Restless Overall Cognitive Status: No family/caregiver present to determine baseline cognitive functioning Area of Impairment: Attention;Following commands;Safety/judgement;Awareness                       Following Commands:  (Follows no commands) Safety/Judgement: Decreased awareness of safety;Decreased awareness of deficits Awareness: Intellectual   General Comments: pt restless and groaning when awake however does closes eyes and appear to be sleeping at times as well, pt has restrained UEs and mittens, not following simple commands or answering questions      Exercises General Exercises - Lower Extremity Ankle Circles/Pumps: PROM;Both;10 reps Heel Slides: PROM;Both;10 reps Hip ABduction/ADduction: PROM;Both;10 reps    General Comments        Pertinent Vitals/Pain Pain Assessment: Faces Faces Pain Scale: Hurts little more Pain Location: groaning and then falling asleep Pain Descriptors / Indicators: Discomfort;Guarding;Grimacing Pain Intervention(s): Repositioned;Monitored during session    Home Living  Prior Function            PT Goals (current goals can now be found in the care plan section) Progress towards PT goals: Not progressing toward goals - comment    Frequency    Min 2X/week      PT Plan Other (comment) (not progressing, cognition limiting)    Co-evaluation              AM-PAC PT "6 Clicks" Mobility   Outcome Measure  Help needed turning from your back to your side while in a flat bed without using bedrails?: Total Help needed moving from lying on your back to sitting on the side of a flat bed without using bedrails?: Total Help needed moving to and from a bed to a chair (including a wheelchair)?: Total Help needed standing up from a chair using your arms (e.g., wheelchair or bedside chair)?: Total Help  needed to walk in hospital room?: Total Help needed climbing 3-5 steps with a railing? : Total 6 Click Score: 6    End of Session   Activity Tolerance: Other (comment) (limited by cognition) Patient left: in bed;with call bell/phone within reach;with restraints reapplied   PT Visit Diagnosis: Other symptoms and signs involving the nervous system (Z36.644)     Time: 0347-4259 PT Time Calculation (min) (ACUTE ONLY): 24 min  Charges:  $Therapeutic Activity: 8-22 mins                    Thomasene Mohair PT, DPT Acute Rehabilitation Services Pager: [phone] Office: [phone]  Maida Sale E 04/28/2020, 4:51 PM

## 2020-04-28 NOTE — Progress Notes (Signed)
NUTRITION NOTE RD working remotely.   Full assessment completed 9/13. NGT being placed by nursing staff today. Cortrak service is only available on the [employer]. If nursing staff on the floor unable to place NGT, will need to coordinate with ICU team for bedside placement.   Monitor magnesium, potassium, and phosphorus daily for at least 3 days, MD to replete as needed, as pt is at risk for refeeding syndrome given poor nutrition for at least 1 week.  Will place TF order: Glucerna 1.2 @ 20 ml/hr to advance by 10 ml every 8 hours to reach goal rate of 60 ml/hr with 100 ml free water every 4 hours.  At goal rate, this regimen will provide 1728 kcal, 86 grams protein, and 1759 ml free water.    Estimated Nutritional Needs:  Kcal:  1800-2000 kcal Protein:  80-90 grams Fluid:  >/= 1.8 L/day       Trenton Gammon, MS, RD, LDN, CNSC Inpatient Clinical Dietitian RD pager # available in AMION  After hours/weekend pager # available in [facility]

## 2020-04-28 NOTE — Progress Notes (Signed)
PROGRESS NOTE  Charlene Parker:[id number] DOB: August 26, 1950   PCP: Jaclyn Shaggy, MD  Patient is from: Home.  DOA: 04/19/2020 LOS: 9  Brief Narrative / Interim history: 69 year old female with history of DM-2, HTN, GERD and morbid obesity presenting from infusion clinic with hypoxemia to 84% and shortness of breath.  Patient had her first Moderna vaccine on 8/23.  Developed Covid-like symptoms on 8/27 and tested positive for COVID-19 on 8/28.  Presented to [facility] on 9/3 with SOB, cough, substernal chest pain and generalized weakness.  CXR compatible with multifocal pneumonia in the setting of COVID-19.  Reportedly maintained appropriate saturation with ambulation and discharged home.  She was directed to infusion clinic for monoclonal antibody. Husband with COVID-19 infection at home.  In ED, confused.  CBG 59.  ABG without significant finding.  Saturating at 81% on RA requiring 6 L. K3.0.  CRP 19.4.  Other inflammatory markers elevated.  Lactic acid 2.3>> 1.9.  Pro-Cal negative.  CXR consistent with multifocal pneumonia.  Received Decadron and remdesivir in ED.  Admitted for acute hypoxemic respiratory failure due to COVID-19 infection, acute metabolic encephalopathy and hypoglycemia.  She was a started on baricitinib, Solu-Medrol and remdesivir.  She was on 50 L by HFNC.  She remains confused/disoriented requiring chemical and physical restraints as she continued to try to climb out of the bed, pull off oxygen and IV lines.  For the first time today, she has been taken off of the oxygen and she is saturating well about 90%.  Due to rising D-dimer, I obtained CT angiogram of the chest as well as Doppler lower extremity and she was ruled out of PE and DVT.  Due to confusion, I have ordered CT head today.  Assessment & Plan: Acute hypoxemic respiratory failure due to COVID-19 pneumonia/infection-progressive symptoms since 8/27.  Tested positive on 8/28.  Desaturated as low as 81% on RA.  CXR with  bilateral infiltrates.  Inflammatory markers mixed/upgoing and downgoing.  She is now on room air for the first time today.  She is ruled out of PE and DVT. -Remdesivir 9/5-9/9 -Continue baricitinib and Solu-Medrol 9/5>>> if she remains on room air by tomorrow, consideration can be given to starting baricitinib and switching Solu-Medrol to tapering dose of prednisone tomorrow. -Continue supportive care with inhalers, mucolytic's, antitussive, vitamin C/zinc, incentive spirometry.  She is currently on 14 L of high flow nasal cannula oxygen. -Continue monitoring inflammatory markers -PT/OT   Acute metabolic encephalopathy likely due to COVID-19 infection: still confused, disoriented and but not as agitated.  No apparent focal neuro deficit.  ABG without significant finding. No significant response to as needed Haldol.  -Treat COVID-19 infection as above. -Reorientation and delirium precautions. -Continue IV Haldol and Ativan as needed but discontinue Vistaril. -Continue bilateral wrist restraints when needed.  Due to prolonged episode of encephalopathy despite of improving from Covid perspective, I had ordered CT of the head yesterday which is still pending.   Uncontrolled DM-2 with hyperglycemia and hypoglycemia-on insulin, Jardiance, Trulicity and Metformin at home.  A1c 7.2%.  Blood sugar much better controlled.  Will continue Levemir to 40 units twice daily and continue Tradjenta and NovoLog 10 units AC and SSI.  Hypokalemia: Resolved. -Monitor and replenish as appropriate  Hypernatremia: Na 147> 151> 141.  Resolved. -Continue D5-1/2NS-KCl as she is not taking anything p.o.  Moderate to severe protein calorie malnutrition: Reportedly patient has not taken anything oral for last 8 days due to being incoherent.  Nutrition consulted.  They recommended starting tube feedings and per my discussion, they were going to place NG tube and start feedings but patient did not have tube feedings today.  RN  was advised to reach out to nutrition department about this and find out if they are recommending NG tube vs cortrak and start tube feedings.  Essential hypertension: Fairly controlled. -IV metoprolol 2.5 mg every 6 hours  GERD-does not seem to be on meds at home. -Continue IV Protonix  Prolonged QTC-499 on initial EKG. 451 on repeat EKG. -Monitor K and Mg and optimize  Morbid obesity: BMI 37.73 in patient with uncontrolled diabetes. -Encourage lifestyle change to lose weight  DVT prophylaxis:  enoxaparin (LOVENOX) injection 40 mg Start: 04/19/20 2200  Code Status: Full code Family Communication: Updated husband over the phone. Status is: Inpatient  Remains inpatient appropriate because:Altered mental status, IV treatments appropriate due to intensity of illness or inability to take PO and Inpatient level of care appropriate due to severity of illness.     Dispo: The patient is from: Home              Anticipated d/c is to: Home              Anticipated d/c date is: > 3 days              Patient currently is not medically stable to d/c.       Consultants:  None   Subjective: Seen and examined.  Remains encephalopathy.  Continues to moan when touched.  Withdraws.  Objective: Vitals:   04/27/20 2233 04/28/20 0117 04/28/20 0524 04/28/20 0800  BP:   (!) 155/96   Pulse:   83   Resp:      Temp:   98.9 F (37.2 C)   TempSrc:      SpO2: 92% 97%  100%  Weight:      Height:        Intake/Output Summary (Last 24 hours) at 04/28/2020 1119 Last data filed at 04/28/2020 0527 Gross per 24 hour  Intake 1018.31 ml  Output 1200 ml  Net -181.69 ml   Filed Weights   04/23/20 0010 04/25/20 0140  Weight: 97 kg 97 kg    Examination:  General exam: Lethargic and incoherent Respiratory system: Clear to auscultation with diminished breath sounds due to body habitus. Respiratory effort normal. Cardiovascular system: S1 & S2 heard, RRR. No JVD, murmurs, rubs, gallops or  clicks. No pedal edema. Gastrointestinal system: Abdomen is nondistended, soft and nontender. No organomegaly or masses felt. Normal bowel sounds heard. Central nervous system: Lethargic   Procedures:  None  Microbiology summarized: COVID-19 PCR positive.   Sch Meds:  Scheduled Meds: . (feeding supplement) PROSource Plus  30 mL Oral BID BM  . albuterol  2 puff Inhalation TID  . baricitinib  4 mg Oral Daily  . enoxaparin (LOVENOX) injection  40 mg Subcutaneous Q24H  . insulin aspart  0-20 Units Subcutaneous TID WC  . insulin aspart  0-5 Units Subcutaneous QHS  . insulin aspart  10 Units Subcutaneous TID WC  . insulin detemir  40 Units Subcutaneous BID  . linagliptin  5 mg Oral Daily  . methylPREDNISolone (SOLU-MEDROL) injection  60 mg Intravenous Q12H  . metoprolol tartrate  2.5 mg Intravenous Q6H  . multivitamin with minerals  1 tablet Oral Daily  . pantoprazole (PROTONIX) IV  40 mg Intravenous Q24H   Continuous Infusions: . dextrose 5 % and 0.45 % NaCl with KCl 20 mEq/L  100 mL/hr at 04/28/20 0527   PRN Meds:.acetaminophen, guaiFENesin-dextromethorphan, haloperidol lactate, LORazepam  Antimicrobials: Anti-infectives (From admission, onward)   Start     Dose/Rate Route Frequency Ordered Stop   04/20/20 1000  remdesivir 100 mg in sodium chloride 0.9 % 100 mL IVPB       "Followed by" Linked Group Details   100 mg 200 mL/hr over 30 Minutes Intravenous Daily 04/19/20 1601 04/23/20 1256   04/20/20 1000  remdesivir 100 mg in sodium chloride 0.9 % 100 mL IVPB  Status:  Discontinued       "Followed by" Linked Group Details   100 mg 200 mL/hr over 30 Minutes Intravenous Daily 04/19/20 1915 04/19/20 1919   04/19/20 1915  remdesivir 200 mg in sodium chloride 0.9% 250 mL IVPB  Status:  Discontinued       "Followed by" Linked Group Details   200 mg 580 mL/hr over 30 Minutes Intravenous Once 04/19/20 1915 04/19/20 1919   04/19/20 1700  remdesivir 200 mg in sodium chloride 0.9% 250 mL  IVPB       "Followed by" Linked Group Details   200 mg 580 mL/hr over 30 Minutes Intravenous Once 04/19/20 1601 04/19/20 1814       I have personally reviewed the following labs and images: CBC: Recent Labs  Lab 04/24/20 0444 04/25/20 0517 04/26/20 0544 04/27/20 0452 04/28/20 0441  WBC 11.0* 11.6* 15.5* 11.3* 16.0*  NEUTROABS 9.5* 10.3* 13.1* 10.4* 15.0*  HGB 14.5 14.4 14.8 13.5 15.2*  HCT 43.7 44.4 45.1 41.6 46.3*  MCV 81.7 82.7 82.3 83.7 82.4  PLT 305 239 328 239 238   BMP &GFR Recent Labs  Lab 04/22/20 0519 04/22/20 0519 04/23/20 0444 04/23/20 0444 04/24/20 0444 04/25/20 0517 04/26/20 0544 04/27/20 0452 04/28/20 0441  NA 151*   < > 142   < > 141 140 142 138 137  K 4.2   < > 3.5   < > 4.0 4.3 3.9 3.9 4.2  CL 111   < > 110   < > 106 104 105 106 105  CO2 20*   < > 23   < > 21* 23 26 23  21*  GLUCOSE 268*   < > 137*   < > 326* 269* 117* 198* 105*  BUN 30*   < > 25*   < > 20 17 14 12 10   CREATININE 0.87   < > 0.69   < > 0.67 0.62 0.65 0.60 0.58  CALCIUM 9.9   < > 9.2   < > 9.3 9.2 9.4 8.7* 8.9  MG 2.7*  --  2.6*  --  2.5*  --   --   --   --    < > = values in this interval not displayed.   Estimated Creatinine Clearance: 74.6 mL/min (by C-G formula based on SCr of 0.58 mg/dL). Liver & Pancreas: Recent Labs  Lab 04/24/20 0444 04/25/20 0517 04/26/20 0544 04/27/20 0452 04/28/20 0441  AST 31 32 32 18 24  ALT 27 33 31 25 26   ALKPHOS 42 48 51 45 52  BILITOT 0.9 1.3* 1.1 1.0 1.0  PROT 6.7 6.4* 6.7 5.5* 6.3*  ALBUMIN 3.0* 2.9* 2.8* 2.2* 2.6*   No results for input(s): LIPASE, AMYLASE in the last 168 hours. No results for input(s): AMMONIA in the last 168 hours. Diabetic: No results for input(s): HGBA1C in the last 72 hours. Recent Labs  Lab 04/27/20 0847 04/27/20 1119 04/27/20 1728 04/27/20 2157 04/28/20 0758  GLUCAP 209*  215* 97 78 123*   Cardiac Enzymes: No results for input(s): CKTOTAL, CKMB, CKMBINDEX, TROPONINI in the last 168 hours. No results  for input(s): PROBNP in the last 8760 hours. Coagulation Profile: No results for input(s): INR, PROTIME in the last 168 hours. Thyroid Function Tests: No results for input(s): TSH, T4TOTAL, FREET4, T3FREE, THYROIDAB in the last 72 hours. Lipid Profile: No results for input(s): CHOL, HDL, LDLCALC, TRIG, CHOLHDL, LDLDIRECT in the last 72 hours. Anemia Panel: Recent Labs    04/27/20 0452 04/28/20 0441  FERRITIN 482* 539*   Urine analysis:    Component Value Date/Time   COLORURINE YELLOW 04/22/2020 0300   APPEARANCEUR CLOUDY (A) 04/22/2020 0300   APPEARANCEUR Hazy 11/11/2014 1041   LABSPEC 1.032 (H) 04/22/2020 0300   LABSPEC 1.023 11/11/2014 1041   PHURINE 5.0 04/22/2020 0300   GLUCOSEU 50 (A) 04/22/2020 0300   GLUCOSEU >=500 11/11/2014 1041   HGBUR NEGATIVE 04/22/2020 0300   BILIRUBINUR NEGATIVE 04/22/2020 0300   BILIRUBINUR Negative 11/11/2014 1041   KETONESUR 5 (A) 04/22/2020 0300   PROTEINUR 30 (A) 04/22/2020 0300   NITRITE NEGATIVE 04/22/2020 0300   LEUKOCYTESUR NEGATIVE 04/22/2020 0300   LEUKOCYTESUR Negative 11/11/2014 1041   Sepsis Labs: Invalid input(s): PROCALCITONIN, LACTICIDVEN  Microbiology: Recent Results (from the past 240 hour(s))  Blood Culture (routine x 2)     Status: None   Collection Time: 04/19/20 12:23 PM   Specimen: BLOOD  Result Value Ref Range Status   Specimen Description   Final    BLOOD LEFT ANTECUBITAL Performed at [facility], [address] [place], [place] [address]    Special Requests   Final    BOTTLES DRAWN AEROBIC AND ANAEROBIC Blood Culture adequate volume Performed at [facility], [address] [place], [place] [address]    Culture   Final    NO GROWTH 5 DAYS Performed at [facility], [address], [place], [place] [address]    Report Status 04/24/2020 FINAL  Final  Blood Culture (routine x 2)     Status: None   Collection Time: 04/19/20 12:28 PM   Specimen: BLOOD  Result  Value Ref Range Status   Specimen Description   Final    BLOOD RIGHT ANTECUBITAL Performed at [facility], [address], [place], [place] [address]    Special Requests   Final    BOTTLES DRAWN AEROBIC AND ANAEROBIC Blood Culture adequate volume Performed at [facility], [address] [place], [place] [address]    Culture   Final    NO GROWTH 5 DAYS Performed at [facility], [address] [place], [place] [address]    Report Status 04/24/2020 FINAL  Final    Radiology Studies: CT HEAD WO CONTRAST  Result Date: 04/28/2020 CLINICAL DATA:  Acute metabolic encephalopathy due to COVID-19 infection. EXAM: CT HEAD WITHOUT CONTRAST TECHNIQUE: Contiguous axial images were obtained from the base of the skull through the vertex without intravenous contrast. COMPARISON:  CT head 01/07/2012 FINDINGS: Brain: Motion degraded study.  Two temps were made due to motion. Ventricle size and cerebral volume normal for age. Mild patchy white matter hypodensity bilaterally appears chronic. These findings have progressed since 2013. Negative for acute infarct, hemorrhage, mass. Vascular: Negative for hyperdense vessel Skull: Negative Sinuses/Orbits: Paranasal sinuses clear.  Negative orbit. Other: None IMPRESSION: Motion degraded study No acute abnormality identified. Electronically Signed   By: Marlan Palau M.D.   On: 04/28/2020  11:08   Renne Crigler, MD [facility]  If 7PM-7AM, please contact night-coverage [email or web address] 04/28/2020, 11:19 AM

## 2020-04-28 NOTE — Progress Notes (Signed)
NGT placed via L nare. Pt tolerated well. Awaiting for abd xray to confirm placement.

## 2020-04-28 NOTE — Progress Notes (Signed)
Patient continues to groan and pull away from care. Patient continually moving, kicking off blankets, any interaction with the patient including noise in patient room results in patient's movement. Head CT ordered, CT chest results near unreadable due to movement. Unable to verbally instruct patient.

## 2020-04-29 LAB — CBC WITH DIFFERENTIAL/PLATELET
Abs Immature Granulocytes: 0.22 10*3/uL — ABNORMAL HIGH (ref 0.00–0.07)
Basophils Absolute: 0 10*3/uL (ref 0.0–0.1)
Basophils Relative: 0 %
Eosinophils Absolute: 0 10*3/uL (ref 0.0–0.5)
Eosinophils Relative: 0 %
HCT: 45.8 % (ref 36.0–46.0)
Hemoglobin: 14.8 g/dL (ref 12.0–15.0)
Immature Granulocytes: 2 %
Lymphocytes Relative: 5 %
Lymphs Abs: 0.5 10*3/uL — ABNORMAL LOW (ref 0.7–4.0)
MCH: 26.8 pg (ref 26.0–34.0)
MCHC: 32.3 g/dL (ref 30.0–36.0)
MCV: 83 fL (ref 80.0–100.0)
Monocytes Absolute: 0.2 10*3/uL (ref 0.1–1.0)
Monocytes Relative: 2 %
Neutro Abs: 10 10*3/uL — ABNORMAL HIGH (ref 1.7–7.7)
Neutrophils Relative %: 91 %
Platelets: 250 10*3/uL (ref 150–400)
RBC: 5.52 MIL/uL — ABNORMAL HIGH (ref 3.87–5.11)
RDW: 13.5 % (ref 11.5–15.5)
WBC: 11 10*3/uL — ABNORMAL HIGH (ref 4.0–10.5)
nRBC: 0 % (ref 0.0–0.2)

## 2020-04-29 LAB — COMPREHENSIVE METABOLIC PANEL
ALT: 22 U/L (ref 0–44)
AST: 16 U/L (ref 15–41)
Albumin: 2.5 g/dL — ABNORMAL LOW (ref 3.5–5.0)
Alkaline Phosphatase: 60 U/L (ref 38–126)
Anion gap: 12 (ref 5–15)
BUN: 15 mg/dL (ref 8–23)
CO2: 20 mmol/L — ABNORMAL LOW (ref 22–32)
Calcium: 9 mg/dL (ref 8.9–10.3)
Chloride: 105 mmol/L (ref 98–111)
Creatinine, Ser: 0.56 mg/dL (ref 0.44–1.00)
GFR calc Af Amer: >60 mL/min (ref >60)
GFR calc non Af Amer: >60 mL/min (ref >60)
Glucose, Bld: 240 mg/dL — ABNORMAL HIGH (ref 70–99)
Potassium: 4.2 mmol/L (ref 3.5–5.1)
Sodium: 137 mmol/L (ref 135–145)
Total Bilirubin: 0.9 mg/dL (ref 0.3–1.2)
Total Protein: 6.1 g/dL — ABNORMAL LOW (ref 6.5–8.1)

## 2020-04-29 LAB — GLUCOSE, CAPILLARY
Glucose-Capillary: 111 mg/dL — ABNORMAL HIGH (ref 70–99)
Glucose-Capillary: 162 mg/dL — ABNORMAL HIGH (ref 70–99)
Glucose-Capillary: 169 mg/dL — ABNORMAL HIGH (ref 70–99)
Glucose-Capillary: 214 mg/dL — ABNORMAL HIGH (ref 70–99)
Glucose-Capillary: 222 mg/dL — ABNORMAL HIGH (ref 70–99)
Glucose-Capillary: 246 mg/dL — ABNORMAL HIGH (ref 70–99)

## 2020-04-29 LAB — MAGNESIUM: Magnesium: 2.4 mg/dL (ref 1.7–2.4)

## 2020-04-29 LAB — D-DIMER, QUANTITATIVE: D-Dimer, Quant: 1.72 ug/mL-FEU — ABNORMAL HIGH (ref 0.00–0.50)

## 2020-04-29 LAB — VITAMIN B12: Vitamin B-12: 674 pg/mL (ref 180–914)

## 2020-04-29 LAB — FERRITIN: Ferritin: 501 ng/mL — ABNORMAL HIGH (ref 11–307)

## 2020-04-29 LAB — PHOSPHORUS: Phosphorus: 3.1 mg/dL (ref 2.5–4.6)

## 2020-04-29 LAB — C-REACTIVE PROTEIN: CRP: 3.3 mg/dL — ABNORMAL HIGH (ref <1.0)

## 2020-04-29 MED ORDER — QUETIAPINE FUMARATE 25 MG PO TABS
25.0000 mg | ORAL_TABLET | Freq: Every day | ORAL | Status: DC
  Administered 2020-04-29 – 2020-05-02 (×3): 25 mg via ORAL
  Filled 2020-04-29 (×3): qty 1

## 2020-04-29 MED ORDER — INSULIN ASPART 100 UNIT/ML ~~LOC~~ SOLN
0.0000 [IU] | SUBCUTANEOUS | Status: DC
  Administered 2020-04-29 – 2020-04-30 (×3): 2 [IU] via SUBCUTANEOUS
  Administered 2020-04-30 – 2020-05-01 (×3): 1 [IU] via SUBCUTANEOUS
  Administered 2020-05-01 (×2): 2 [IU] via SUBCUTANEOUS
  Administered 2020-05-01: 3 [IU] via SUBCUTANEOUS
  Administered 2020-05-03 (×3): 1 [IU] via SUBCUTANEOUS
  Administered 2020-05-03: 2 [IU] via SUBCUTANEOUS
  Administered 2020-05-04: 1 [IU] via SUBCUTANEOUS
  Administered 2020-05-04: 3 [IU] via SUBCUTANEOUS
  Administered 2020-05-04 (×2): 2 [IU] via SUBCUTANEOUS
  Administered 2020-05-04: 1 [IU] via SUBCUTANEOUS
  Administered 2020-05-04: 2 [IU] via SUBCUTANEOUS
  Administered 2020-05-05: 3 [IU] via SUBCUTANEOUS
  Administered 2020-05-05: 1 [IU] via SUBCUTANEOUS
  Administered 2020-05-05: 3 [IU] via SUBCUTANEOUS
  Administered 2020-05-06: 1 [IU] via SUBCUTANEOUS
  Administered 2020-05-06: 2 [IU] via SUBCUTANEOUS
  Administered 2020-05-06: 3 [IU] via SUBCUTANEOUS
  Administered 2020-05-06: 2 [IU] via SUBCUTANEOUS
  Administered 2020-05-06: 1 [IU] via SUBCUTANEOUS
  Administered 2020-05-06: 2 [IU] via SUBCUTANEOUS
  Administered 2020-05-07: 1 [IU] via SUBCUTANEOUS
  Administered 2020-05-07: 3 [IU] via SUBCUTANEOUS
  Administered 2020-05-07: 1 [IU] via SUBCUTANEOUS
  Administered 2020-05-07: 2 [IU] via SUBCUTANEOUS
  Administered 2020-05-07 (×2): 3 [IU] via SUBCUTANEOUS
  Administered 2020-05-08 (×2): 1 [IU] via SUBCUTANEOUS
  Administered 2020-05-08: 3 [IU] via SUBCUTANEOUS
  Administered 2020-05-08: 1 [IU] via SUBCUTANEOUS
  Administered 2020-05-08: 2 [IU] via SUBCUTANEOUS
  Administered 2020-05-08: 1 [IU] via SUBCUTANEOUS

## 2020-04-29 MED ORDER — INSULIN ASPART 100 UNIT/ML ~~LOC~~ SOLN
4.0000 [IU] | SUBCUTANEOUS | Status: DC
  Administered 2020-04-29 – 2020-05-01 (×11): 4 [IU] via SUBCUTANEOUS

## 2020-04-29 NOTE — Progress Notes (Signed)
PROGRESS NOTE    Charlene Parker  XBJ:[id number] DOB: 1951-07-29 DOA: 04/19/2020 PCP: Jaclyn Shaggy, MD    Brief Narrative:  69 year old female with type 2 diabetes on insulin, hypertension, GERD and morbid obesity presented from infusion clinic with hypoxemia to 84% and shortness of breath. Took first more than a vaccine on 8/23 Covid-like symptoms on 8/27, tested positive on 8/28. Presented to [facility] on 9/3 with shortness of breath cough and substernal chest pain as well as generalized weakness.  Discharged home to attend infusion clinic for monoclonal antibody.  Found hypoxic and confused and sent to ER and admitted on 9/5. At the emergency room, confused.  Blood sugars 59.  81% on room air initially requiring 6 L of oxygen.  Procalcitonin normal.  Multifocal pneumonia. Patient was treated with baricitinib, Solu-Medrol and remdesivir along with other supportive treatments.  Was on 15 L high flow nasal cannula.  Remained confused and disoriented requiring chemical and physical restraints. 9/15, weaned off to the room air however remains persistently agitated and confused.   Assessment & Plan:   Principal Problem:   Acute hypoxemic respiratory failure due to COVID-19 [facility]) Active Problems:   Encephalopathy due to COVID-19 virus   Hypoglycemia   Essential hypertension   Type 2 diabetes mellitus with hypoglycemia without coma (HCC)  Acute hypoxemic respiratory failure due to COVID-19 infection/COVID-19 pneumonia: Continue to monitor due to significant symptoms now with encephalopathy. chest physiotherapy, incentive spirometry, deep breathing exercises, sputum induction, mucolytic's and bronchodilators as much as she can tolerate. Supplemental oxygen to keep saturations more than 90%.  Mostly on room air. Covid directed therapy with , steroids, patient received 11 days of high-dose steroids, she is on room air now, will discontinue due to persistent encephalopathy. remdesivir, completed 5  days of therapy Baricitinib, day 11.  Now on room air.  Unknown cause of encephalopathy.  Will discontinue. COVID-19 Labs  Recent Labs    04/27/20 0452 04/28/20 0441 04/29/20 0505  DDIMER 1.61* 2.28* 1.72*  FERRITIN 482* 539* 501*  CRP 9.6* 6.3* 3.3*    Lab Results  Component Value Date   SARSCOV2NAA Not Detected 09/10/2019   SpO2: 91 % O2 Flow Rate (L/min): 3.5 L/min FiO2 (%): 100 %  Acute metabolic encephalopathy likely due to COVID-19 infection: Patient remains persistently confused, disoriented and intermittently agitated.  No focal neurological deficit.  Thrashing all extremities equally. CT head with no acute findings, shows chronic microvascular changes. To protect lines and prevent from fall, will use soft restraints as needed. Since patient has NG tube now, will start patient on scheduled Seroquel.  As needed Ativan and Haldol for agitation. We will check B12 and folic acid.  Uncontrolled type 2 diabetes with hyperglycemia, hypoglycemic on arrival: On multiple regimen at home.  A1c 7.2.  Blood sugars better controlled now on insulin regimen. Patient is n.p.o. and now tolerating tube feeding, continue Levemir 40 twice a day, will add scheduled NovoLog every 4 hours as well as sliding scale.  Hypernatremia: Resolved.  Discontinue dextrose containing fluid.   DVT prophylaxis: enoxaparin (LOVENOX) injection 40 mg Start: 04/19/20 2200    Code Status: Full code Family Communication: Husband on the phone Disposition Plan: Status is: Inpatient  Remains inpatient appropriate because:Unsafe d/c plan and Inpatient level of care appropriate due to severity of illness   Dispo: The patient is from: Home              Anticipated d/c is to: Unknown at this time  Anticipated d/c date is: > 3 days              Patient currently is not medically stable to d/c.         Consultants:   None  Procedures:   None  Antimicrobials:   Completed  remdesivir   Subjective: Patient seen and examined.  Overnight intermittently agitated requiring soft restraints.  Patient keeps her eyes closed and does not communicate.  She moves all extremities.  Objective: Vitals:   04/29/20 0450 04/29/20 0453 04/29/20 0559 04/29/20 1118  BP: (!) 174/97 (!) 163/81 (!) 153/75 (!) 153/94  Pulse: (!) 105 94 79 88  Resp:    20  Temp: 98.8 F (37.1 C)   98.6 F (37 C)  TempSrc:    Axillary  SpO2:    91%  Weight:      Height:        Intake/Output Summary (Last 24 hours) at 04/29/2020 1400 Last data filed at 04/29/2020 0900 Gross per 24 hour  Intake 1440 ml  Output 1150 ml  Net 290 ml   Filed Weights   04/23/20 0010 04/25/20 0140  Weight: 97 kg 97 kg    Examination:  Physical Exam Constitutional:      Comments: Patient looks sick, lethargic, incoherent.  Not following commands.  On room air.  HENT:     Head: Atraumatic.  Cardiovascular:     Rate and Rhythm: Regular rhythm.  Pulmonary:     Comments: Decreased bilateral air entry.  No added sounds. Abdominal:     General: Bowel sounds are normal.     Palpations: Abdomen is soft.  Musculoskeletal:     Left lower leg: No tenderness. No edema.  Neurological:     General: No focal deficit present.     Mental Status: She is disoriented.  Psychiatric:        Mood and Affect: Mood is anxious.        Behavior: Behavior is agitated.       Data Reviewed: I have personally reviewed following labs and imaging studies  CBC: Recent Labs  Lab 04/25/20 0517 04/26/20 0544 04/27/20 0452 04/28/20 0441 04/29/20 0505  WBC 11.6* 15.5* 11.3* 16.0* 11.0*  NEUTROABS 10.3* 13.1* 10.4* 15.0* 10.0*  HGB 14.4 14.8 13.5 15.2* 14.8  HCT 44.4 45.1 41.6 46.3* 45.8  MCV 82.7 82.3 83.7 82.4 83.0  PLT 239 328 239 238 250   Basic Metabolic Panel: Recent Labs  Lab 04/23/20 0444 04/23/20 0444 04/24/20 0444 04/24/20 0444 04/25/20 0517 04/26/20 0544 04/27/20 0452 04/28/20 0441 04/29/20 0505   NA 142   < > 141   < > 140 142 138 137 137  K 3.5   < > 4.0   < > 4.3 3.9 3.9 4.2 4.2  CL 110   < > 106   < > 104 105 106 105 105  CO2 23   < > 21*   < > 23 26 23  21* 20*  GLUCOSE 137*   < > 326*   < > 269* 117* 198* 105* 240*  BUN 25*   < > 20   < > 17 14 12 10 15   CREATININE 0.69   < > 0.67   < > 0.62 0.65 0.60 0.58 0.56  CALCIUM 9.2   < > 9.3   < > 9.2 9.4 8.7* 8.9 9.0  MG 2.6*  --  2.5*  --   --   --   --  2.3 2.4  PHOS  --   --   --   --   --   --   --  2.6 3.1   < > = values in this interval not displayed.   GFR: Estimated Creatinine Clearance: 74.6 mL/min (by C-G formula based on SCr of 0.56 mg/dL). Liver Function Tests: Recent Labs  Lab 04/25/20 0517 04/26/20 0544 04/27/20 0452 04/28/20 0441 04/29/20 0505  AST 32 32 18 24 16   ALT 33 31 25 26 22   ALKPHOS 48 51 45 52 60  BILITOT 1.3* 1.1 1.0 1.0 0.9  PROT 6.4* 6.7 5.5* 6.3* 6.1*  ALBUMIN 2.9* 2.8* 2.2* 2.6* 2.5*   No results for input(s): LIPASE, AMYLASE in the last 168 hours. No results for input(s): AMMONIA in the last 168 hours. Coagulation Profile: No results for input(s): INR, PROTIME in the last 168 hours. Cardiac Enzymes: No results for input(s): CKTOTAL, CKMB, CKMBINDEX, TROPONINI in the last 168 hours. BNP (last 3 results) No results for input(s): PROBNP in the last 8760 hours. HbA1C: No results for input(s): HGBA1C in the last 72 hours. CBG: Recent Labs  Lab 04/28/20 2005 04/28/20 2352 04/29/20 0429 04/29/20 0841 04/29/20 1113  GLUCAP 133* 148* 222* 246* 214*   Lipid Profile: No results for input(s): CHOL, HDL, LDLCALC, TRIG, CHOLHDL, LDLDIRECT in the last 72 hours. Thyroid Function Tests: No results for input(s): TSH, T4TOTAL, FREET4, T3FREE, THYROIDAB in the last 72 hours. Anemia Panel: Recent Labs    04/28/20 0441 04/29/20 0505  FERRITIN 539* 501*   Sepsis Labs: Recent Labs  Lab 04/28/20 0441  PROCALCITON <0.10    No results found for this or any previous visit (from the past 240  hour(s)).       Radiology Studies: DG Abd 1 View  Result Date: 04/28/2020 CLINICAL DATA:  Enteric tube placement. EXAM: ABDOMEN - 1 VIEW COMPARISON:  CTA chest dated April 26, 2020. FINDINGS: Enteric tube in the distal stomach. The bowel gas pattern is normal. No radio-opaque calculi or other significant radiographic abnormality are seen. Patchy asymmetric opacities at both lung bases. No acute osseous abnormality. IMPRESSION: 1. Enteric tube in the distal stomach. 2. Bibasilar pneumonia. Electronically Signed   By: 04/30/2020 M.D.   On: 04/28/2020 16:02   CT HEAD WO CONTRAST  Result Date: 04/28/2020 CLINICAL DATA:  Acute metabolic encephalopathy due to COVID-19 infection. EXAM: CT HEAD WITHOUT CONTRAST TECHNIQUE: Contiguous axial images were obtained from the base of the skull through the vertex without intravenous contrast. COMPARISON:  CT head 01/07/2012 FINDINGS: Brain: Motion degraded study.  Two temps were made due to motion. Ventricle size and cerebral volume normal for age. Mild patchy white matter hypodensity bilaterally appears chronic. These findings have progressed since 2013. Negative for acute infarct, hemorrhage, mass. Vascular: Negative for hyperdense vessel Skull: Negative Sinuses/Orbits: Paranasal sinuses clear.  Negative orbit. Other: None IMPRESSION: Motion degraded study No acute abnormality identified. Electronically Signed   By: 01/09/2012 M.D.   On: 04/28/2020 11:08        Scheduled Meds: . albuterol  2 puff Inhalation TID  . enoxaparin (LOVENOX) injection  40 mg Subcutaneous Q24H  . free water  100 mL Per Tube Q4H  . insulin aspart  0-9 Units Subcutaneous Q4H  . insulin aspart  4 Units Subcutaneous Q4H  . insulin detemir  40 Units Subcutaneous BID  . linagliptin  5 mg Oral Daily  . metoprolol tartrate  2.5 mg Intravenous Q6H  . multivitamin with minerals  1 tablet  Oral Daily  . pantoprazole (PROTONIX) IV  40 mg Intravenous Q24H  . QUEtiapine  25 mg  Oral QHS   Continuous Infusions: . feeding supplement (GLUCERNA 1.5 CAL) 1,000 mL (04/28/20 1619)     LOS: 10 days    Time spent: 30 minutes    Dorcas CarrowKuber Aashi Derrington, MD [facility] Pager [phone]

## 2020-04-29 NOTE — Progress Notes (Signed)
Inpatient Diabetes Program Recommendations  AACE/ADA: New Consensus Statement on Inpatient Glycemic Control (2015)  Target Ranges:  Prepandial:   less than 140 mg/dL      Peak postprandial:   less than 180 mg/dL (1-2 hours)      Critically ill patients:  140 - 180 mg/dL   Lab Results  Component Value Date   GLUCAP 246 (H) 04/29/2020   HGBA1C 7.2 (H) 04/21/2020    Review of Glycemic Control  Diabetes history:  Outpatient Diabetes medications: Lantus 40 units qhs, Humalog, Metformin 500 mg bid Current orders for Inpatient glycemic control:  Levemir 40 units bid Novolog 0-20 units tid + hs Novolog 10 units tid meal coverage  Glucerna 1.5 60 ml/hour Solumedrol 60 mg Q12 hours  Inpatient Diabetes Program Recommendations:    Tube Feeds running. While on Tube feeds:  -  Change Novolog Correction to Q4 hours -  Add Novolog Tube Feed Coverage 4 units Q4 hours  -  D/c Novolog 10 units tid meal coverage  Thanks,  Christena Deem RN, MSN, BC-ADM Inpatient Diabetes Coordinator Team Pager [phone] (8a-5p)

## 2020-04-30 ENCOUNTER — Inpatient Hospital Stay (HOSPITAL_COMMUNITY): Payer: Managed Care, Other (non HMO)

## 2020-04-30 LAB — CBC WITH DIFFERENTIAL/PLATELET
Abs Immature Granulocytes: 0.25 10*3/uL — ABNORMAL HIGH (ref 0.00–0.07)
Basophils Absolute: 0.1 10*3/uL (ref 0.0–0.1)
Basophils Relative: 0 %
Eosinophils Absolute: 0 10*3/uL (ref 0.0–0.5)
Eosinophils Relative: 0 %
HCT: 46.1 % — ABNORMAL HIGH (ref 36.0–46.0)
Hemoglobin: 15 g/dL (ref 12.0–15.0)
Immature Granulocytes: 2 %
Lymphocytes Relative: 10 %
Lymphs Abs: 1.7 10*3/uL (ref 0.7–4.0)
MCH: 26.9 pg (ref 26.0–34.0)
MCHC: 32.5 g/dL (ref 30.0–36.0)
MCV: 82.8 fL (ref 80.0–100.0)
Monocytes Absolute: 0.6 10*3/uL (ref 0.1–1.0)
Monocytes Relative: 4 %
Neutro Abs: 13.9 10*3/uL — ABNORMAL HIGH (ref 1.7–7.7)
Neutrophils Relative %: 84 %
Platelets: 209 10*3/uL (ref 150–400)
RBC: 5.57 MIL/uL — ABNORMAL HIGH (ref 3.87–5.11)
RDW: 13.8 % (ref 11.5–15.5)
WBC: 16.5 10*3/uL — ABNORMAL HIGH (ref 4.0–10.5)
nRBC: 0.1 % (ref 0.0–0.2)

## 2020-04-30 LAB — COMPREHENSIVE METABOLIC PANEL
ALT: 23 U/L (ref 0–44)
AST: 20 U/L (ref 15–41)
Albumin: 2.5 g/dL — ABNORMAL LOW (ref 3.5–5.0)
Alkaline Phosphatase: 64 U/L (ref 38–126)
Anion gap: 9 (ref 5–15)
BUN: 20 mg/dL (ref 8–23)
CO2: 23 mmol/L (ref 22–32)
Calcium: 9 mg/dL (ref 8.9–10.3)
Chloride: 105 mmol/L (ref 98–111)
Creatinine, Ser: 0.69 mg/dL (ref 0.44–1.00)
GFR calc Af Amer: >60 mL/min (ref >60)
GFR calc non Af Amer: >60 mL/min (ref >60)
Glucose, Bld: 93 mg/dL (ref 70–99)
Potassium: 4 mmol/L (ref 3.5–5.1)
Sodium: 137 mmol/L (ref 135–145)
Total Bilirubin: 0.8 mg/dL (ref 0.3–1.2)
Total Protein: 6.1 g/dL — ABNORMAL LOW (ref 6.5–8.1)

## 2020-04-30 LAB — GLUCOSE, CAPILLARY
Glucose-Capillary: 136 mg/dL — ABNORMAL HIGH (ref 70–99)
Glucose-Capillary: 136 mg/dL — ABNORMAL HIGH (ref 70–99)
Glucose-Capillary: 139 mg/dL — ABNORMAL HIGH (ref 70–99)
Glucose-Capillary: 142 mg/dL — ABNORMAL HIGH (ref 70–99)
Glucose-Capillary: 168 mg/dL — ABNORMAL HIGH (ref 70–99)
Glucose-Capillary: 174 mg/dL — ABNORMAL HIGH (ref 70–99)
Glucose-Capillary: 79 mg/dL (ref 70–99)

## 2020-04-30 LAB — PHOSPHORUS: Phosphorus: 2.9 mg/dL (ref 2.5–4.6)

## 2020-04-30 LAB — MAGNESIUM: Magnesium: 2.5 mg/dL — ABNORMAL HIGH (ref 1.7–2.4)

## 2020-04-30 LAB — FOLATE: Folate: 8.8 ng/mL (ref >5.9)

## 2020-04-30 MED ORDER — INSULIN DETEMIR 100 UNIT/ML ~~LOC~~ SOLN
20.0000 [IU] | Freq: Two times a day (BID) | SUBCUTANEOUS | Status: DC
  Administered 2020-04-30 – 2020-05-01 (×3): 20 [IU] via SUBCUTANEOUS
  Filled 2020-04-30 (×4): qty 0.2

## 2020-04-30 NOTE — Progress Notes (Signed)
PROGRESS NOTE    Charlene Parker  VQQ:[id number] DOB: [birth year] DOA: 04/19/2020 PCP: Jaclyn Shaggy, MD    Brief Narrative:  69 year old female with type 2 diabetes on insulin, hypertension, GERD and morbid obesity presented from infusion clinic with hypoxemia to 84% and shortness of breath. Took first more than a vaccine on 8/23 Covid-like symptoms on 8/27, tested positive on 8/28. Presented to [facility] on 9/3 with shortness of breath cough and substernal chest pain as well as generalized weakness.  Discharged home to attend infusion clinic for monoclonal antibody.  Found hypoxic and confused and sent to ER and admitted on 9/5. At the emergency room, confused.  Blood sugars 59.  81% on room air initially requiring 6 L of oxygen.  Procalcitonin normal.  Multifocal pneumonia. Patient was treated with baricitinib, Solu-Medrol and remdesivir along with other supportive treatments.  Was on 15 L high flow nasal cannula.  Remained confused and disoriented requiring chemical and physical restraints. 9/15, weaned off to the room air however remains persistently agitated and confused.   Assessment & Plan:   Principal Problem:   Acute hypoxemic respiratory failure due to COVID-19 [facility]) Active Problems:   Encephalopathy due to COVID-19 virus   Hypoglycemia   Essential hypertension   Type 2 diabetes mellitus with hypoglycemia without coma (HCC)  Acute hypoxemic respiratory failure due to COVID-19 infection/COVID-19 pneumonia: Continue to monitor due to significant symptoms now with encephalopathy. chest physiotherapy, incentive spirometry, deep breathing exercises, sputum induction, mucolytic's and bronchodilators as much as she can tolerate. Supplemental oxygen to keep saturations more than 90%.  Mostly on room air. Covid directed therapy with , steroids, patient received 11 days of high-dose steroids, she is on room air now, discontinued his steroids because of persistent encephalopathy.     remdesivir, completed 5 days of therapy Baricitinib, day 11.  Now on room air.  Discontinued. COVID-19 Labs  Recent Labs    04/28/20 0441 04/29/20 0505  DDIMER 2.28* 1.72*  FERRITIN 539* 501*  CRP 6.3* 3.3*    Lab Results  Component Value Date   SARSCOV2NAA Not Detected 09/10/2019   SpO2: 90 % O2 Flow Rate (L/min): 3.5 L/min FiO2 (%): 100 %  Acute metabolic encephalopathy likely due to COVID-19 infection: Patient remains persistently confused, disoriented and intermittently agitated.  No focal neurological deficit.  CT head with no acute findings, shows chronic microvascular changes. To protect lines and prevent from fall, will use soft restraints as needed. As needed Ativan and Haldol for agitation.  Started on Seroquel at night. B12 and folic acid levels were normal. With persistent encephalopathy, will try to check MRI of the brain today.  Will give Haldol before the scan.  Uncontrolled type 2 diabetes with hyperglycemia, hypoglycemic on arrival: On multiple regimen at home.  A1c 7.2.  Blood sugars better controlled now on insulin regimen. Patient is n.p.o. and now tolerating tube feeding,  Blood sugars trending down after stopping high-dose steroids, decrease Levemir.  Continue NovoLog insulin coverage.    Hypernatremia: Resolved.     DVT prophylaxis: enoxaparin (LOVENOX) injection 40 mg Start: 04/19/20 2200    Code Status: Full code Family Communication: None.  Has been unable to pick up the phone.  Will try again. Disposition Plan: Status is: Inpatient  Remains inpatient appropriate because:Unsafe d/c plan and Inpatient level of care appropriate due to severity of illness   Dispo: The patient is from: Home              Anticipated d/c  is to: Unknown at this time              Anticipated d/c date is: > 3 days              Patient currently is not medically stable to d/c.         Consultants:   None  Procedures:   None  Antimicrobials:    Completed remdesivir   Subjective: Patient seen and examined.  Remains lethargic, intermittently agitated.  Unable to provide any history.  Today she was able to follow some simple commands on repeated stimulation like moving the right leg.  Objective: Vitals:   04/29/20 0559 04/29/20 1118 04/29/20 2030 04/30/20 0445  BP: (!) 153/75 (!) 153/94 (!) 165/92 138/70  Pulse: 79 88 90 85  Resp:  20 (!) 22 20  Temp:  98.6 F (37 C) 98.2 F (36.8 C) 98.6 F (37 C)  TempSrc:  Axillary Axillary Axillary  SpO2:  91% 96% 90%  Weight:    96.8 kg  Height:        Intake/Output Summary (Last 24 hours) at 04/30/2020 1123 Last data filed at 04/30/2020 1000 Gross per 24 hour  Intake 2790 ml  Output 2050 ml  Net 740 ml   Filed Weights   04/23/20 0010 04/25/20 0140 04/30/20 0445  Weight: 97 kg 97 kg 96.8 kg    Examination:  Physical Exam Constitutional:      Comments: Patient looks sick, lethargic, incoherent.  She did follow very simple commands on repeated stimulation.  HENT:     Head: Atraumatic.  Cardiovascular:     Rate and Rhythm: Regular rhythm.  Pulmonary:     Comments: Decreased bilateral air entry.  No added sounds. Abdominal:     General: Bowel sounds are normal.     Palpations: Abdomen is soft.  Musculoskeletal:     Left lower leg: No tenderness. No edema.  Neurological:     General: No focal deficit present.     Mental Status: She is disoriented.  Psychiatric:        Mood and Affect: Mood is anxious.        Behavior: Behavior is agitated.       Data Reviewed: I have personally reviewed following labs and imaging studies  CBC: Recent Labs  Lab 04/26/20 0544 04/27/20 0452 04/28/20 0441 04/29/20 0505 04/30/20 0455  WBC 15.5* 11.3* 16.0* 11.0* 16.5*  NEUTROABS 13.1* 10.4* 15.0* 10.0* 13.9*  HGB 14.8 13.5 15.2* 14.8 15.0  HCT 45.1 41.6 46.3* 45.8 46.1*  MCV 82.3 83.7 82.4 83.0 82.8  PLT 328 239 238 250 209   Basic Metabolic Panel: Recent Labs  Lab  04/24/20 0444 04/25/20 0517 04/26/20 0544 04/27/20 0452 04/28/20 0441 04/29/20 0505 04/30/20 0455  NA 141   < > 142 138 137 137 137  K 4.0   < > 3.9 3.9 4.2 4.2 4.0  CL 106   < > 105 106 105 105 105  CO2 21*   < > 26 23 21* 20* 23  GLUCOSE 326*   < > 117* 198* 105* 240* 93  BUN 20   < > 14 12 10 15 20   CREATININE 0.67   < > 0.65 0.60 0.58 0.56 0.69  CALCIUM 9.3   < > 9.4 8.7* 8.9 9.0 9.0  MG 2.5*  --   --   --  2.3 2.4 2.5*  PHOS  --   --   --   --  2.6  3.1 2.9   < > = values in this interval not displayed.   GFR: Estimated Creatinine Clearance: 74.6 mL/min (by C-G formula based on SCr of 0.69 mg/dL). Liver Function Tests: Recent Labs  Lab 04/26/20 0544 04/27/20 0452 04/28/20 0441 04/29/20 0505 04/30/20 0455  AST 32 18 24 16 20   ALT 31 25 26 22 23   ALKPHOS 51 45 52 60 64  BILITOT 1.1 1.0 1.0 0.9 0.8  PROT 6.7 5.5* 6.3* 6.1* 6.1*  ALBUMIN 2.8* 2.2* 2.6* 2.5* 2.5*   No results for input(s): LIPASE, AMYLASE in the last 168 hours. No results for input(s): AMMONIA in the last 168 hours. Coagulation Profile: No results for input(s): INR, PROTIME in the last 168 hours. Cardiac Enzymes: No results for input(s): CKTOTAL, CKMB, CKMBINDEX, TROPONINI in the last 168 hours. BNP (last 3 results) No results for input(s): PROBNP in the last 8760 hours. HbA1C: No results for input(s): HGBA1C in the last 72 hours. CBG: Recent Labs  Lab 04/29/20 2022 04/29/20 2357 04/30/20 0437 04/30/20 0750 04/30/20 1013  GLUCAP 162* 111* 79 142* 136*   Lipid Profile: No results for input(s): CHOL, HDL, LDLCALC, TRIG, CHOLHDL, LDLDIRECT in the last 72 hours. Thyroid Function Tests: No results for input(s): TSH, T4TOTAL, FREET4, T3FREE, THYROIDAB in the last 72 hours. Anemia Panel: Recent Labs    04/28/20 0441 04/29/20 0505 04/30/20 0455  VITAMINB12  --  674  --   FOLATE  --   --  8.8  FERRITIN 539* 501*  --    Sepsis Labs: Recent Labs  Lab 04/28/20 0441  PROCALCITON <0.10     No results found for this or any previous visit (from the past 240 hour(s)).       Radiology Studies: DG Abd 1 View  Result Date: 04/28/2020 CLINICAL DATA:  Enteric tube placement. EXAM: ABDOMEN - 1 VIEW COMPARISON:  CTA chest dated April 26, 2020. FINDINGS: Enteric tube in the distal stomach. The bowel gas pattern is normal. No radio-opaque calculi or other significant radiographic abnormality are seen. Patchy asymmetric opacities at both lung bases. No acute osseous abnormality. IMPRESSION: 1. Enteric tube in the distal stomach. 2. Bibasilar pneumonia. Electronically Signed   By: 04/30/2020 M.D.   On: 04/28/2020 16:02        Scheduled Meds: . albuterol  2 puff Inhalation TID  . enoxaparin (LOVENOX) injection  40 mg Subcutaneous Q24H  . free water  100 mL Per Tube Q4H  . insulin aspart  0-9 Units Subcutaneous Q4H  . insulin aspart  4 Units Subcutaneous Q4H  . insulin detemir  20 Units Subcutaneous BID  . linagliptin  5 mg Oral Daily  . metoprolol tartrate  2.5 mg Intravenous Q6H  . multivitamin with minerals  1 tablet Oral Daily  . pantoprazole (PROTONIX) IV  40 mg Intravenous Q24H  . QUEtiapine  25 mg Oral QHS   Continuous Infusions: . feeding supplement (GLUCERNA 1.5 CAL) 60 mL/hr at 04/30/20 1000     LOS: 11 days    Time spent: 30 minutes    04/30/2020, MD [facility] Pager [phone]

## 2020-04-30 NOTE — Progress Notes (Signed)
Unable to safely clear pt for MRI. Will speak with family tomorrow to clear pt.

## 2020-04-30 NOTE — Plan of Care (Signed)

## 2020-05-01 ENCOUNTER — Inpatient Hospital Stay (HOSPITAL_COMMUNITY)
Admit: 2020-05-01 | Discharge: 2020-05-01 | Disposition: A | Discharge status: Home / Self Care | Payer: Managed Care, Other (non HMO) | Attending: Internal Medicine | Admitting: Internal Medicine

## 2020-05-01 ENCOUNTER — Inpatient Hospital Stay (HOSPITAL_COMMUNITY): Payer: Managed Care, Other (non HMO)

## 2020-05-01 LAB — CBC WITH DIFFERENTIAL/PLATELET
Abs Immature Granulocytes: 0.29 10*3/uL — ABNORMAL HIGH (ref 0.00–0.07)
Basophils Absolute: 0.1 10*3/uL (ref 0.0–0.1)
Basophils Relative: 0 %
Eosinophils Absolute: 0.2 10*3/uL (ref 0.0–0.5)
Eosinophils Relative: 1 %
HCT: 47.4 % — ABNORMAL HIGH (ref 36.0–46.0)
Hemoglobin: 15.4 g/dL — ABNORMAL HIGH (ref 12.0–15.0)
Immature Granulocytes: 2 %
Lymphocytes Relative: 8 %
Lymphs Abs: 1.2 10*3/uL (ref 0.7–4.0)
MCH: 26.8 pg (ref 26.0–34.0)
MCHC: 32.5 g/dL (ref 30.0–36.0)
MCV: 82.6 fL (ref 80.0–100.0)
Monocytes Absolute: 0.7 10*3/uL (ref 0.1–1.0)
Monocytes Relative: 5 %
Neutro Abs: 13.2 10*3/uL — ABNORMAL HIGH (ref 1.7–7.7)
Neutrophils Relative %: 84 %
Platelets: 219 10*3/uL (ref 150–400)
RBC: 5.74 MIL/uL — ABNORMAL HIGH (ref 3.87–5.11)
RDW: 14.2 % (ref 11.5–15.5)
WBC: 15.6 10*3/uL — ABNORMAL HIGH (ref 4.0–10.5)
nRBC: 0.2 % (ref 0.0–0.2)

## 2020-05-01 LAB — GLUCOSE, CAPILLARY
Glucose-Capillary: 158 mg/dL — ABNORMAL HIGH (ref 70–99)
Glucose-Capillary: 166 mg/dL — ABNORMAL HIGH (ref 70–99)
Glucose-Capillary: 195 mg/dL — ABNORMAL HIGH (ref 70–99)
Glucose-Capillary: 208 mg/dL — ABNORMAL HIGH (ref 70–99)
Glucose-Capillary: 66 mg/dL — ABNORMAL LOW (ref 70–99)
Glucose-Capillary: 79 mg/dL (ref 70–99)
Glucose-Capillary: 93 mg/dL (ref 70–99)

## 2020-05-01 LAB — COMPREHENSIVE METABOLIC PANEL
ALT: 26 U/L (ref 0–44)
AST: 19 U/L (ref 15–41)
Albumin: 2.4 g/dL — ABNORMAL LOW (ref 3.5–5.0)
Alkaline Phosphatase: 78 U/L (ref 38–126)
Anion gap: 10 (ref 5–15)
BUN: 23 mg/dL (ref 8–23)
CO2: 26 mmol/L (ref 22–32)
Calcium: 8.9 mg/dL (ref 8.9–10.3)
Chloride: 100 mmol/L (ref 98–111)
Creatinine, Ser: 0.77 mg/dL (ref 0.44–1.00)
GFR calc Af Amer: >60 mL/min (ref >60)
GFR calc non Af Amer: >60 mL/min (ref >60)
Glucose, Bld: 190 mg/dL — ABNORMAL HIGH (ref 70–99)
Potassium: 4 mmol/L (ref 3.5–5.1)
Sodium: 136 mmol/L (ref 135–145)
Total Bilirubin: 0.6 mg/dL (ref 0.3–1.2)
Total Protein: 5.9 g/dL — ABNORMAL LOW (ref 6.5–8.1)

## 2020-05-01 LAB — TSH: TSH: 2.807 u[IU]/mL (ref 0.350–4.500)

## 2020-05-01 MED ORDER — LINAGLIPTIN 5 MG PO TABS
5.0000 mg | ORAL_TABLET | Freq: Every day | ORAL | Status: DC
  Filled 2020-05-01: qty 1

## 2020-05-01 MED ORDER — ADULT MULTIVITAMIN LIQUID CH
15.0000 mL | Freq: Every day | ORAL | Status: DC
  Administered 2020-05-01: 15 mL
  Filled 2020-05-01 (×3): qty 15

## 2020-05-01 MED ORDER — INSULIN DETEMIR 100 UNIT/ML ~~LOC~~ SOLN
10.0000 [IU] | Freq: Two times a day (BID) | SUBCUTANEOUS | Status: DC
  Filled 2020-05-01 (×2): qty 0.1

## 2020-05-01 MED ORDER — GUAIFENESIN-DM 100-10 MG/5ML PO SYRP
10.0000 mL | ORAL_SOLUTION | ORAL | Status: DC | PRN
  Administered 2020-05-07 – 2020-05-08 (×2): 10 mL
  Filled 2020-05-01 (×2): qty 10

## 2020-05-01 MED ORDER — DEXTROSE 50 % IV SOLN
12.5000 g | INTRAVENOUS | Status: AC
  Administered 2020-05-01: 12.5 g via INTRAVENOUS
  Filled 2020-05-01: qty 50

## 2020-05-01 NOTE — Progress Notes (Signed)
EEG completed, results pending. 

## 2020-05-01 NOTE — TOC Progression Note (Signed)
Transition of Care [facility]) - Progression Note    Patient Details  Name: Charlene Parker MRN: [id number] Date of Birth: [birth year]  [facility]) CM/SW Contact  Geni Bers, RN Phone Number: 05/01/2020, 1:48 PM  Clinical Narrative:    Pt is AMS, not ready for discharge.    Expected Discharge Plan: Skilled Nursing Facility Barriers to Discharge: No Barriers Identified  Expected Discharge Plan and Services Expected Discharge Plan: Skilled Nursing Facility                                               Social Determinants of Health (SDOH) Interventions    Readmission Risk Interventions No flowsheet data found.

## 2020-05-01 NOTE — Progress Notes (Signed)
PROGRESS NOTE    Charlene Parker  OXB:[id number] DOB: 1950-09-26 DOA: 04/19/2020 PCP: Jaclyn Shaggy, MD    Brief Narrative:  69 year old female with type 2 diabetes on insulin, hypertension, GERD and morbid obesity presented from infusion clinic with hypoxemia to 84% and shortness of breath. Took first dose of Moderna vaccine on 8/23 Covid-like symptoms on 8/27, tested positive on 8/28. Presented to [facility] on 9/3 with shortness of breath cough and substernal chest pain as well as generalized weakness.  Discharged home to attend infusion clinic for monoclonal antibody.  Found hypoxic and confused and sent to ER and admitted on 9/5. At the emergency room, confused.  Blood sugars 59.  81% on room air initially requiring 6 L of oxygen.  Procalcitonin normal.  Multifocal pneumonia. Patient was treated with baricitinib, Solu-Medrol and remdesivir along with other supportive treatments.  Was on 15 L high flow nasal cannula.  Remained confused and disoriented requiring chemical and physical restraints. 9/15, weaned off to the room air however remains persistently agitated and confused.   Assessment & Plan:   Principal Problem:   Acute hypoxemic respiratory failure due to COVID-19 [facility]) Active Problems:   Encephalopathy due to COVID-19 virus   Hypoglycemia   Essential hypertension   Type 2 diabetes mellitus with hypoglycemia without coma (HCC)  Acute hypoxemic respiratory failure due to COVID-19 infection/COVID-19 pneumonia: Continue to monitor due to significant symptoms now with encephalopathy. chest physiotherapy, incentive spirometry, deep breathing exercises, sputum induction, mucolytic's and bronchodilators as much as she can tolerate. Supplemental oxygen to keep saturations more than 90%.  Mostly on room air. Covid directed therapy with , steroids, patient received 11 days of high-dose steroids, she is on room air now, discontinued his steroids because of persistent encephalopathy.     remdesivir, completed 5 days of therapy Baricitinib, day 11.  Now on room air.  Discontinued. COVID-19 Labs  Recent Labs    04/29/20 0505  DDIMER 1.72*  FERRITIN 501*  CRP 3.3*    Lab Results  Component Value Date   SARSCOV2NAA Not Detected 09/10/2019  Results available from Care Everywhere.  Tested positive on 8/28.  Acute metabolic encephalopathy likely due to COVID-19 infection: Persistently confused and disoriented.  Intermittently agitated.  No focal deficits.   CT head with no acute findings, shows chronic microvascular changes. To protect lines and prevent from fall, will use soft restraints as needed. As needed Ativan and Haldol for agitation.  Started on Seroquel at night with some response.  She is more calm and quiet now. B12 and folic acid levels were normal. With persistent encephalopathy, will try to check MRI of the brain today.  Will give Haldol before the scan. Will discuss case with neurology for evaluation, less likely seizure. CNS Covid is possible. If unable to do MRI, will repeat CT head with contrast.  Uncontrolled type 2 diabetes with hyperglycemia, hypoglycemic on arrival: On multiple regimen at home.  A1c 7.2.  Blood sugars better controlled now on insulin regimen. Patient is n.p.o. and now tolerating tube feeding,  Adjusting long-acting insulin as well as NovoLog.  Hypernatremia: Resolved.     DVT prophylaxis: Place and maintain sequential compression device Start: 05/01/20 1131, will hold today in case we need to do spinal tap    Code Status: Full code Family Communication: Husband on the phone 9/16, will update after MRI. Disposition Plan: Status is: Inpatient  Remains inpatient appropriate because:Unsafe d/c plan and Inpatient level of care appropriate due to severity of illness  Dispo: The patient is from: Home              Anticipated d/c is to: Unknown at this time              Anticipated d/c date is: > 3 days              Patient  currently is not medically stable to d/c.         Consultants:   None  Procedures:   None  Antimicrobials:   Completed remdesivir   Subjective: Patient seen and examined.  Remains lethargic.  Follows very simple commands.  Keeps eyes closed.  Remains on room air.  Afebrile.  Soft restraints needed to protect from fall and pulling IV lines and NG tube.  Objective: Vitals:   04/30/20 2018 05/01/20 0413 05/01/20 0500 05/01/20 1113  BP: (!) 145/89 137/84  112/86  Pulse: (!) 107 (!) 109  (!) 104  Resp: 20 20  20   Temp: 98.2 F (36.8 C) 98.7 F (37.1 C)  98.6 F (37 C)  TempSrc: Oral Oral  Axillary  SpO2: 90% (!) 88%  91%  Weight:   96.6 kg   Height:        Intake/Output Summary (Last 24 hours) at 05/01/2020 1131 Last data filed at 05/01/2020 1115 Gross per 24 hour  Intake 480 ml  Output 1450 ml  Net -970 ml   Filed Weights   04/25/20 0140 04/30/20 0445 05/01/20 0500  Weight: 97 kg 96.8 kg 96.6 kg    Examination:  Physical Exam Constitutional:      Comments: Patient looks sick, lethargic, incoherent.  She did follow very simple commands on repeated stimulation.  HENT:     Head: Atraumatic.  Cardiovascular:     Rate and Rhythm: Regular rhythm.  Pulmonary:     Comments: Decreased bilateral air entry.  No added sounds. Abdominal:     General: Bowel sounds are normal.     Palpations: Abdomen is soft.  Musculoskeletal:     Left lower leg: No tenderness. No edema.  Neurological:     Comments: Patient follows very simple commands, moves and withdraws all extremities.  Keeps eyes closed.       Data Reviewed: I have personally reviewed following labs and imaging studies  CBC: Recent Labs  Lab 04/27/20 0452 04/28/20 0441 04/29/20 0505 04/30/20 0455 05/01/20 0434  WBC 11.3* 16.0* 11.0* 16.5* 15.6*  NEUTROABS 10.4* 15.0* 10.0* 13.9* 13.2*  HGB 13.5 15.2* 14.8 15.0 15.4*  HCT 41.6 46.3* 45.8 46.1* 47.4*  MCV 83.7 82.4 83.0 82.8 82.6  PLT 239 238  250 209 219   Basic Metabolic Panel: Recent Labs  Lab 04/27/20 0452 04/28/20 0441 04/29/20 0505 04/30/20 0455 05/01/20 0434  NA 138 137 137 137 136  K 3.9 4.2 4.2 4.0 4.0  CL 106 105 105 105 100  CO2 23 21* 20* 23 26  GLUCOSE 198* 105* 240* 93 190*  BUN 12 10 15 20 23   CREATININE 0.60 0.58 0.56 0.69 0.77  CALCIUM 8.7* 8.9 9.0 9.0 8.9  MG  --  2.3 2.4 2.5*  --   PHOS  --  2.6 3.1 2.9  --    GFR: Estimated Creatinine Clearance: 74.5 mL/min (by C-G formula based on SCr of 0.77 mg/dL). Liver Function Tests: Recent Labs  Lab 04/27/20 0452 04/28/20 0441 04/29/20 0505 04/30/20 0455 05/01/20 0434  AST 18 24 16 20 19   ALT 25 26 22 23 26   ALKPHOS  45 52 60 64 78  BILITOT 1.0 1.0 0.9 0.8 0.6  PROT 5.5* 6.3* 6.1* 6.1* 5.9*  ALBUMIN 2.2* 2.6* 2.5* 2.5* 2.4*   No results for input(s): LIPASE, AMYLASE in the last 168 hours. No results for input(s): AMMONIA in the last 168 hours. Coagulation Profile: No results for input(s): INR, PROTIME in the last 168 hours. Cardiac Enzymes: No results for input(s): CKTOTAL, CKMB, CKMBINDEX, TROPONINI in the last 168 hours. BNP (last 3 results) No results for input(s): PROBNP in the last 8760 hours. HbA1C: No results for input(s): HGBA1C in the last 72 hours. CBG: Recent Labs  Lab 04/30/20 2008 04/30/20 2347 05/01/20 0405 05/01/20 0813 05/01/20 1110  GLUCAP 136* 139* 195* 208* 166*   Lipid Profile: No results for input(s): CHOL, HDL, LDLCALC, TRIG, CHOLHDL, LDLDIRECT in the last 72 hours. Thyroid Function Tests: No results for input(s): TSH, T4TOTAL, FREET4, T3FREE, THYROIDAB in the last 72 hours. Anemia Panel: Recent Labs    04/29/20 0505 04/30/20 0455  VITAMINB12 674  --   FOLATE  --  8.8  FERRITIN 501*  --    Sepsis Labs: Recent Labs  Lab 04/28/20 0441  PROCALCITON <0.10    No results found for this or any previous visit (from the past 240 hour(s)).       Radiology Studies: No results  found.      Scheduled Meds: . albuterol  2 puff Inhalation TID  . free water  100 mL Per Tube Q4H  . insulin aspart  0-9 Units Subcutaneous Q4H  . insulin aspart  4 Units Subcutaneous Q4H  . insulin detemir  20 Units Subcutaneous BID  . [START ON 05/02/2020] linagliptin  5 mg Per Tube Daily  . metoprolol tartrate  2.5 mg Intravenous Q6H  . multivitamin  15 mL Per Tube Daily  . pantoprazole (PROTONIX) IV  40 mg Intravenous Q24H  . QUEtiapine  25 mg Oral QHS   Continuous Infusions: . feeding supplement (GLUCERNA 1.5 CAL) 1,000 mL (04/30/20 1839)     LOS: 12 days    Time spent: 30 minutes    Dorcas Carrow, MD [facility] Pager [phone]

## 2020-05-01 NOTE — Progress Notes (Signed)
1330 - RN entered room rounding on patient.  NG tube out of patients nose and lying on bed.  Patient's linen changed.  NG tube to be reinserted.

## 2020-05-01 NOTE — Procedures (Signed)
Patient Name: Charlene Parker  MRN: [id number]  Epilepsy Attending: Charlsie Quest  Referring Physician/Provider: Dr Jaymes Graff Date: 05/01/2020 Duration: 24.50 mins  Patient history: 69yo F with ams. EEG to evaluate for seizure.  Level of alertness: Awake  AEDs during EEG study: None  Technical aspects: This EEG study was done with scalp electrodes positioned according to the 10-20 International system of electrode placement. Electrical activity was acquired at a sampling rate of 500Hz  and reviewed with a high frequency filter of 70Hz  and a low frequency filter of 1Hz . EEG data were recorded continuously and digitally stored.   Description: No posterior dominant rhythm was seen. EEG showed continuous generalized 3 to 6 Hz theta-delta slowing.  Hyperventilation and photic stimulation were not performed.     ABNORMALITY -Continuous slow, generalized  IMPRESSION: This study is suggestive of moderate diffuse encephalopathy, nonspecific etiology. No seizures or epileptiform discharges were seen throughout the recording.   [name] 

## 2020-05-01 NOTE — Progress Notes (Signed)
Patient more alert this evening.  Opening eyes and spoke in sentences at times.  Will continue to monitor.

## 2020-05-01 NOTE — Plan of Care (Signed)

## 2020-05-01 NOTE — Progress Notes (Signed)
RN in [employer].  RN noted NG tube no longer patient's left nare.  NG tube lying on bed.  Restraints in place as ordered.  Vascular WN - ROM performed.  Dr. Jerral Ralph notified that patient had removed NG tube.  Dr. Jerral Ralph gave order to not reinsert NG tube.  May have SLP eval for patient tomorrow.  Stop tube feed.

## 2020-05-01 NOTE — Progress Notes (Signed)
Occupational Therapy Progress Note  Patient still with AMS, unable to follow 1 step directions or interact with therapist in meaningful way. Attempted to have patient wash her face however does not initiate and still requiring total A for all self care. Will discontinue acute OT services at this time. Please re-consult as patient able to participate with therapy.     05/01/20 1000  OT Visit Information  Last OT Received On 05/01/20  Assistance Needed +2  History of Present Illness Charlene Parker is a 69 y.o. female with PMHx HTN, GERD, Diabetes who presents to the ED today via EMS for SOB. Pt received her first dose of Moderna vaccine on Monday 08/23; she began developing COVID like symptoms on 08/27 then tested positive on 08/28. Pt went to [facility] ED on 09/03 for SOB, generalized weakness, substernal chest pain/epigastric pain, and cough. She was treated with LR after being found to be tachycardic. She was well appearing afterwards and was discharged home. Pt went to the Infusion Clinic 9/5 and found to be hypoxic at 84% on RA and confused. Pt transferred vis EMS to [facility].  Precautions  Precautions Fall  Precaution Comments restraints, NG tube  Pain Assessment  Pain Assessment Faces  Faces Pain Scale 2  Pain Location generalized groaning, unable to specify  Cognition  Arousal/Alertness Lethargic  Behavior During Therapy Restless  Overall Cognitive Status No family/caregiver present to determine baseline cognitive functioning  Area of Impairment Attention;Following commands;Safety/judgement;Awareness  Orientation Level Disoriented to;Place;Time;Situation  Current Attention Level Focused  Following Commands  (follows no commands)  Safety/Judgement Decreased awareness of safety;Decreased awareness of deficits  Awareness Intellectual  General Comments patient is restless and groaning throughout treatment, does not respond, follow 1 step directions and keeps eyes closed throughout   ADL  Overall ADL's  Needs assistance/impaired  Grooming Total assistance;Bed level  Grooming Details (indicate cue type and reason) attempted to have patient wash her face however does not initiate, continues to moan.  Pt assisted with warm washcloth to eyes/lids cleaned. Pt's face gently washed with attempts to have pt attend to activity without success.  Transfers  General transfer comment deferred d/t safety and AMS  General Comments  General comments (skin integrity, edema, etc.) patient O2 maintained mid 90s on room air throughout  OT - End of Session  Equipment Utilized During Treatment  (restraints)  Activity Tolerance Treatment limited secondary to agitation;Other (comment) (Treatment limited secondary to AMS)  Patient left in bed;with call bell/phone within reach;Other (comment) (restraints)  Nurse Communication Other (comment) (currently restless; following no commands)  OT Assessment/Plan  OT Plan Other (comment) (discharge patient from OT services 2* mental status)  OT Visit Diagnosis Other abnormalities of gait and mobility (R26.89);Other symptoms and signs involving cognitive function  Follow Up Recommendations Supervision/Assistance - 24 hour;SNF  OT Equipment Other (comment) (defer to next venue)  AM-PAC OT "6 Clicks" Daily Activity Outcome Measure (Version 2)  Help from another person eating meals? 1  Help from another person taking care of personal grooming? 1  Help from another person toileting, which includes using toliet, bedpan, or urinal? 1  Help from another person bathing (including washing, rinsing, drying)? 1  Help from another person to put on and taking off regular upper body clothing? 1  Help from another person to put on and taking off regular lower body clothing? 1  6 Click Score 6  OT Goal Progression  Progress towards OT goals Not progressing toward goals - comment (patient discharged from  OT 2* mental status)  OT Time Calculation  OT Start Time  (ACUTE ONLY) 1035  OT Stop Time (ACUTE ONLY) 1045  OT Time Calculation (min) 10 min  OT General Charges  $OT Visit 1 Visit  OT Treatments  $Self Care/Home Management  8-22 mins   Charlene Parker OT OT pager: [phone]

## 2020-05-02 ENCOUNTER — Inpatient Hospital Stay (HOSPITAL_COMMUNITY): Payer: Managed Care, Other (non HMO)

## 2020-05-02 LAB — COMPREHENSIVE METABOLIC PANEL
ALT: 22 U/L (ref 0–44)
AST: 30 U/L (ref 15–41)
Albumin: 2.8 g/dL — ABNORMAL LOW (ref 3.5–5.0)
Alkaline Phosphatase: 47 U/L (ref 38–126)
Anion gap: 11 (ref 5–15)
BUN: 18 mg/dL (ref 8–23)
CO2: 25 mmol/L (ref 22–32)
Calcium: 9 mg/dL (ref 8.9–10.3)
Chloride: 103 mmol/L (ref 98–111)
Creatinine, Ser: 0.76 mg/dL (ref 0.44–1.00)
GFR calc Af Amer: >60 mL/min (ref >60)
GFR calc non Af Amer: >60 mL/min (ref >60)
Glucose, Bld: 110 mg/dL — ABNORMAL HIGH (ref 70–99)
Potassium: 4.9 mmol/L (ref 3.5–5.1)
Sodium: 139 mmol/L (ref 135–145)
Total Bilirubin: 1.8 mg/dL — ABNORMAL HIGH (ref 0.3–1.2)
Total Protein: 6.6 g/dL (ref 6.5–8.1)

## 2020-05-02 LAB — CBC WITH DIFFERENTIAL/PLATELET
Abs Immature Granulocytes: 0.26 10*3/uL — ABNORMAL HIGH (ref 0.00–0.07)
Basophils Absolute: 0.1 10*3/uL (ref 0.0–0.1)
Basophils Relative: 0 %
Eosinophils Absolute: 0.3 10*3/uL (ref 0.0–0.5)
Eosinophils Relative: 2 %
HCT: 46.4 % — ABNORMAL HIGH (ref 36.0–46.0)
Hemoglobin: 15 g/dL (ref 12.0–15.0)
Immature Granulocytes: 2 %
Lymphocytes Relative: 9 %
Lymphs Abs: 1.2 10*3/uL (ref 0.7–4.0)
MCH: 26.9 pg (ref 26.0–34.0)
MCHC: 32.3 g/dL (ref 30.0–36.0)
MCV: 83.3 fL (ref 80.0–100.0)
Monocytes Absolute: 0.7 10*3/uL (ref 0.1–1.0)
Monocytes Relative: 5 %
Neutro Abs: 11.9 10*3/uL — ABNORMAL HIGH (ref 1.7–7.7)
Neutrophils Relative %: 82 %
Platelets: 266 10*3/uL (ref 150–400)
RBC: 5.57 MIL/uL — ABNORMAL HIGH (ref 3.87–5.11)
RDW: 14.3 % (ref 11.5–15.5)
WBC: 14.5 10*3/uL — ABNORMAL HIGH (ref 4.0–10.5)
nRBC: 0.1 % (ref 0.0–0.2)

## 2020-05-02 LAB — PHOSPHORUS: Phosphorus: 3.6 mg/dL (ref 2.5–4.6)

## 2020-05-02 LAB — GLUCOSE, CAPILLARY
Glucose-Capillary: 110 mg/dL — ABNORMAL HIGH (ref 70–99)
Glucose-Capillary: 117 mg/dL — ABNORMAL HIGH (ref 70–99)
Glucose-Capillary: 119 mg/dL — ABNORMAL HIGH (ref 70–99)
Glucose-Capillary: 119 mg/dL — ABNORMAL HIGH (ref 70–99)
Glucose-Capillary: 148 mg/dL — ABNORMAL HIGH (ref 70–99)
Glucose-Capillary: 149 mg/dL — ABNORMAL HIGH (ref 70–99)

## 2020-05-02 LAB — AMMONIA: Ammonia: 25 umol/L (ref 9–35)

## 2020-05-02 LAB — MAGNESIUM: Magnesium: 2.5 mg/dL — ABNORMAL HIGH (ref 1.7–2.4)

## 2020-05-02 MED ORDER — INSULIN DETEMIR 100 UNIT/ML ~~LOC~~ SOLN
10.0000 [IU] | Freq: Every day | SUBCUTANEOUS | Status: DC
  Administered 2020-05-02 – 2020-05-04 (×3): 10 [IU] via SUBCUTANEOUS
  Filled 2020-05-02 (×3): qty 0.1

## 2020-05-02 NOTE — Evaluation (Addendum)
Clinical/Bedside Swallow Evaluation Patient Details  Name: Charlene Parker MRN: [id number] Date of Birth: 30-Jan-1951  Today's Date: 05/02/2020 Time: SLP Start Time (ACUTE ONLY): 1700 SLP Stop Time (ACUTE ONLY): 1721 SLP Time Calculation (min) (ACUTE ONLY): 21 min  Past Medical History:  Past Medical History:  Diagnosis Date  . Arthritis    knee  . COVID-19 04/10/2020  . Diabetes mellitus without complication (HCC)   . GERD (gastroesophageal reflux disease)   . Hypertension    Past Surgical History:  Past Surgical History:  Procedure Laterality Date  . CARPAL TUNNEL RELEASE Right   . CESAREAN SECTION    . CHOLECYSTECTOMY    . CHONDROPLASTY Left 07/20/2017   Procedure: CHONDROPLASTY;  Surgeon: Signa Kell, MD;  Location: [facility];  Service: Orthopedics;  Laterality: Left;  Diabetic - insulin and oral meds  . GALLBLADDER SURGERY    . KNEE ARTHROSCOPY WITH MENISCAL REPAIR Left 07/20/2017   Procedure: KNEE ARTHROSCOPY WITH MENISCAL REPAIR;  Surgeon: Signa Kell, MD;  Location: [facility];  Service: Orthopedics;  Laterality: Left;  ADDUCTOR CANAL NERVE BLOCK  . KNEE SURGERY Right    HPI:  69 year old female with past medical history of hypertension, GERD, diabetes who presented to the ED via EMS with shortness of breath.  Patient received her first dose of the Moderna vaccine on Monday 8/23 however began having Covid-like symptoms on 8/27 and tested positive at 8/28.  She went to [facility] on 9/30 for shortness of breath, generalized weakness and substernal chest pain and cough and was treated with LR.  She was discharged home from the ED.  She was directed to go to the infusion clinic to get monoclonal antibody however when she went to the infusion clinic this morning she was found to be hypoxic on room air at 84%.  Upon arrival, EMS initially placed her on NRB however she was weaned to 4 L/min.   CXR on 04/19/20 indicated: Increased bilateral airspace opacities are noted  consistent with multifocal pneumonia; 04/28/20 CT head negative; MRI pending.  Assessment / Plan / Recommendation Clinical Impression  Limited BSE with pt refusing most consistencies as she had been given sedative prior to BSE initiated.  Pt removed TF 05/01/20 which generated BSE order.  Oral holding and decreased awareness of bolus of thin via straw (x2 sips) without overt s/s of aspiration noted after delay in the initation of the swallow.  Other consistencies refused by pt despite max multimodal cues given by SLP.  Pt's vocal quality clear with min verbalizations during BSE.  Nursing stated a regular/thin diet initiated prior to BSE after nursing assessment completed.  Pt was extremely lethargic and swallowing function assessment unable to be fully completed at this time.  ST will f/u for swallow re-assessment, but thin liquids are safe to consume per BSE results.  When pt is alert/awareness has increased, ST will resume BSE and determine safest diet for pt at this time.  Thank you for this consult. SLP Visit Diagnosis: Dysphagia, unspecified (R13.10)    Aspiration Risk  Mild aspiration risk;Moderate aspiration risk    Diet Recommendation   Thin liquids  Medication Administration: Other (Comment) (crushed or whole with puree as pt tolerates)    Other  Recommendations Oral Care Recommendations: Oral care BID Other Recommendations: Other (Comment) (TBD)   Follow up Recommendations Other (comment) (TBD)      Frequency and Duration min 2x/week  1 week       Prognosis Prognosis for Safe Diet Advancement:  Good      Swallow Study   General Date of Onset: 04/18/20 HPI: 69 year old female with past medical history of hypertension, GERD, diabetes who presented to the ED via EMS with shortness of breath.  Patient received her first dose of the Moderna vaccine on Monday 8/23 however began having Covid-like symptoms on 8/27 and tested positive at 8/28.  She went to [facility] on 9/30 for shortness of  breath, generalized weakness and substernal chest pain and cough and was treated with LR.  She was discharged home from the ED.  She was directed to go to the infusion clinic to get monoclonal antibody however when she went to the infusion clinic this morning she was found to be hypoxic on room air at 84%.  Upon arrival, EMS initially placed her on NRB however she was weaned to 4 L/min.   Type of Study: Bedside Swallow Evaluation Previous Swallow Assessment: Nursing stated she completed a partial bedside with pt drinking "a cup of water without difficulty." Diet Prior to this Study: Regular;Thin liquids Temperature Spikes Noted: No Respiratory Status: Room air History of Recent Intubation: No Behavior/Cognition: Uncooperative;Lethargic/Drowsy Oral Cavity Assessment: Within Functional Limits Oral Care Completed by SLP: Other (Comment) (Attempted, but pt refused) Oral Cavity - Dentition: Adequate natural dentition Self-Feeding Abilities: Other (Comment) (unable to assess) Patient Positioning: Upright in bed;Other (comment) (on side) Baseline Vocal Quality: Low vocal intensity;Other (comment) (clear with min verbalizations noted d/t lethargy) Volitional Cough: Cognitively unable to elicit Volitional Swallow: Unable to elicit    Oral/Motor/Sensory Function Overall Oral Motor/Sensory Function: Other (comment) (DNT)   Ice Chips Ice chips: Not tested   Thin Liquid Thin Liquid: Impaired Presentation: Straw Oral Phase Impairments: Poor awareness of bolus Oral Phase Functional Implications: Oral holding Pharyngeal  Phase Impairments: Suspected delayed Swallow (primarily d/t medication effect vs function of swallow)    Nectar Thick Nectar Thick Liquid: Not tested   Honey Thick Honey Thick Liquid: Not tested   Puree Puree: Not tested Other Comments: Pt refused   Solid     Solid: Not tested Other Comments: Pt refused      Tressie Stalker, M.S., CCC-SLP 05/02/2020,5:44 PM

## 2020-05-02 NOTE — Progress Notes (Signed)
PROGRESS NOTE    Charlene Parker  ZOX:[id number] DOB: 06/19/1951 DOA: 04/19/2020 PCP: Jaclyn Shaggyate, Denny C, MD    Brief Narrative:  69 year old female with type 2 diabetes on insulin, hypertension, GERD and morbid obesity presented from infusion clinic with hypoxemia to 84% and shortness of breath. Took first dose of Moderna vaccine on 8/23 Covid-like symptoms on 8/27, tested positive on 8/28. [facility] with shortness of breath cough and substernal chest pain as well as generalized weakness.  Discharged home to attend infusion clinic for monoclonal antibody.  Found hypoxic and confused and sent to ER and admitted on 9/5. At the emergency room, confused.  Blood sugars 59.  81% on room air initially requiring 6 L of oxygen.  Procalcitonin normal.  Multifocal pneumonia. Patient was treated with baricitinib, Solu-Medrol and remdesivir along with other supportive treatments.  Was on 15 L high flow nasal cannula.  Remained confused and disoriented requiring chemical and physical restraints. 9/15, weaned off to the room air however remains persistently agitated and confused.   Assessment & Plan:   Principal Problem:   Acute hypoxemic respiratory failure due to COVID-19 [facility](HCC) Active Problems:   Encephalopathy due to COVID-19 virus   Hypoglycemia   Essential hypertension   Type 2 diabetes mellitus with hypoglycemia without coma (HCC)  Acute hypoxemic respiratory failure due to COVID-19 infection/COVID-19 pneumonia: Continue to monitor due to significant symptoms now with encephalopathy. chest physiotherapy, incentive spirometry, deep breathing exercises, sputum induction, mucolytic's and bronchodilators as much as she can tolerate. Supplemental oxygen to keep saturations more than 90%.  Mostly on room air. Covid directed therapy with , steroids, patient received 11 days of high-dose steroids, she is on room air now, discontinued his steroids because of persistent encephalopathy.     remdesivir, completed 5 days of therapy Baricitinib, day 11.  Now on room air.  Discontinued. COVID-19 Labs  No results for input(s): DDIMER, FERRITIN, LDH, CRP in the last 72 hours.  Lab Results  Component Value Date   SARSCOV2NAA Not Detected 09/10/2019  Results available from Care Everywhere.  Tested positive on 8/28.  Acute metabolic encephalopathy likely due to COVID-19 infection: Persistently confused and disoriented.  Intermittently agitated.  No focal deficits.  No history of drugs or alcohol. CT head with no acute findings, shows chronic microvascular changes. She needed soft restraints intermittently to protect from fall and protect lines. Patient was on NG tube feeding, more alert today, NG tube came off 9/17. Since patient is waking up Speech, PT OT today. As needed Haldol.  Seroquel at night once she is able to pass swallow evaluation. B12 folic acid normal.  TSH normal.  Will check ammonia level. We will try to get MRI of the brain today to rule out multifocal stroke. EEG was done that was without evidence of seizure.  Uncontrolled type 2 diabetes with hyperglycemia, hypoglycemic on arrival: On multiple regimen at home.  A1c 7.2.  Blood sugars better controlled now on insulin regimen. NG tube out. Speech swallow evaluation. We will keep on minimum insulin to avoid hypoglycemia.  Hypernatremia: Resolved.     DVT prophylaxis: Place and maintain sequential compression device Start: 05/01/20 1131,    Code Status: Full code Family Communication: Husband. Disposition Plan: Status is: Inpatient  Remains inpatient appropriate because:Unsafe d/c plan and Inpatient level of care appropriate due to severity of illness   Dispo: The patient is from: Home              Anticipated  d/c is to: Unknown at this time              Anticipated d/c date is: > 3 days              Patient currently is not medically stable to d/c.         Consultants:   None  Procedures:    None  Antimicrobials:   Completed remdesivir   Subjective: Patient seen and examined.  Could not be taken to MRI overnight.  Surprisingly, she was able to respond to greetings today.  Still not able to have clear conversation.  Follows commands with some difficulty.  Objective: Vitals:   05/02/20 0032 05/02/20 0355 05/02/20 0500 05/02/20 1103  BP: 125/78 127/81  111/89  Pulse: 84 79  82  Resp:  20    Temp:  97.6 F (36.4 C)  98 F (36.7 C)  TempSrc:  Oral  Oral  SpO2:  92%  94%  Weight:   95.4 kg   Height:        Intake/Output Summary (Last 24 hours) at 05/02/2020 1113 Last data filed at 05/02/2020 1100 Gross per 24 hour  Intake 0 ml  Output 600 ml  Net -600 ml   Filed Weights   04/30/20 0445 05/01/20 0500 05/02/20 0500  Weight: 96.8 kg 96.6 kg 95.4 kg    Examination:  Physical Exam Constitutional:      Comments: Patient is more alert and communicating today.  She was able to respond the greetings.  Still pretty sleepy.  Voice is clear.  HENT:     Head: Atraumatic.  Cardiovascular:     Rate and Rhythm: Regular rhythm.  Pulmonary:     Comments: Decreased bilateral air entry.  No added sounds. Abdominal:     General: Bowel sounds are normal.     Palpations: Abdomen is soft.  Musculoskeletal:     Left lower leg: No tenderness. No edema.  Neurological:     Comments: Patient awake at times.  No more agitations or delirium.  Still lethargic.  AAO x0.       Data Reviewed: I have personally reviewed following labs and imaging studies  CBC: Recent Labs  Lab 04/28/20 0441 04/29/20 0505 04/30/20 0455 05/01/20 0434 05/02/20 0526  WBC 16.0* 11.0* 16.5* 15.6* 14.5*  NEUTROABS 15.0* 10.0* 13.9* 13.2* 11.9*  HGB 15.2* 14.8 15.0 15.4* 15.0  HCT 46.3* 45.8 46.1* 47.4* 46.4*  MCV 82.4 83.0 82.8 82.6 83.3  PLT 238 250 209 219 266   Basic Metabolic Panel: Recent Labs  Lab 04/28/20 0441 04/29/20 0505 04/30/20 0455 05/01/20 0434 05/02/20 0526  NA 137  137 137 136 139  K 4.2 4.2 4.0 4.0 4.9  CL 105 105 105 100 103  CO2 21* 20* 23 26 25   GLUCOSE 105* 240* 93 190* 110*  BUN 10 15 20 23 18   CREATININE 0.58 0.56 0.69 0.77 0.76  CALCIUM 8.9 9.0 9.0 8.9 9.0  MG 2.3 2.4 2.5*  --  2.5*  PHOS 2.6 3.1 2.9  --  3.6   GFR: Estimated Creatinine Clearance: 74 mL/min (by C-G formula based on SCr of 0.76 mg/dL). Liver Function Tests: Recent Labs  Lab 04/28/20 0441 04/29/20 0505 04/30/20 0455 05/01/20 0434 05/02/20 0526  AST 24 16 20 19 30   ALT 26 22 23 26 22   ALKPHOS 52 60 64 78 47  BILITOT 1.0 0.9 0.8 0.6 1.8*  PROT 6.3* 6.1* 6.1* 5.9* 6.6  ALBUMIN 2.6* 2.5* 2.5*  2.4* 2.8*   No results for input(s): LIPASE, AMYLASE in the last 168 hours. No results for input(s): AMMONIA in the last 168 hours. Coagulation Profile: No results for input(s): INR, PROTIME in the last 168 hours. Cardiac Enzymes: No results for input(s): CKTOTAL, CKMB, CKMBINDEX, TROPONINI in the last 168 hours. BNP (last 3 results) No results for input(s): PROBNP in the last 8760 hours. HbA1C: No results for input(s): HGBA1C in the last 72 hours. CBG: Recent Labs  Lab 05/01/20 2216 05/01/20 2341 05/02/20 0350 05/02/20 0823 05/02/20 1101  GLUCAP 158* 93 119* 117* 110*   Lipid Profile: No results for input(s): CHOL, HDL, LDLCALC, TRIG, CHOLHDL, LDLDIRECT in the last 72 hours. Thyroid Function Tests: Recent Labs    05/01/20 0427  TSH 2.807   Anemia Panel: Recent Labs    04/30/20 0455  FOLATE 8.8   Sepsis Labs: Recent Labs  Lab 04/28/20 0441  PROCALCITON <0.10    No results found for this or any previous visit (from the past 240 hour(s)).       Radiology Studies: EEG  Result Date: 05/01/2020 Charlsie Quest, MD     05/01/2020  6:01 PM Patient Name: SHARMAYNE JABLON MRN: [id number] Epilepsy Attending: Charlsie Quest Referring Physician/Provider: Dr Jaymes Graff Date: 05/01/2020 Duration: 24.50 mins Patient history: 69yo F with ams. EEG to  evaluate for seizure. Level of alertness: Awake AEDs during EEG study: None Technical aspects: This EEG study was done with scalp electrodes positioned according to the 10-20 International system of electrode placement. Electrical activity was acquired at a sampling rate of 500Hz  and reviewed with a high frequency filter of 70Hz  and a low frequency filter of 1Hz . EEG data were recorded continuously and digitally stored. Description: No posterior dominant rhythm was seen. EEG showed continuous generalized 3 to 6 Hz theta-delta slowing.  Hyperventilation and photic stimulation were not performed.   ABNORMALITY -Continuous slow, generalized IMPRESSION: This study is suggestive of moderate diffuse encephalopathy, nonspecific etiology. No seizures or epileptiform discharges were seen throughout the recording.   DG Abd 1 View  Result Date: 05/01/2020 CLINICAL DATA:  NG tube placement EXAM: ABDOMEN - 1 VIEW COMPARISON:  Abdominal radiograph 04/28/2020, CTa chest 04/26/2020 FINDINGS: Transesophageal tube tip terminates in the left upper quadrant the region of the proximal stomach with the side port at the GE junction. Recommend advancing 3-5 cm for optimal positioning. Redemonstration of the heterogeneous bibasilar opacities. Cardiomediastinal contours are similar to prior. Surgical clips again seen in the upper abdomen. Air-filled appearance of the colon is nonspecific no high-grade obstructive small bowel gas pattern is seen. Multilevel degenerative changes in the spine and SI joints. Mild levocurvature of the spine. IMPRESSION: 1. Transesophageal tube tip terminates in the left upper quadrant the region of the proximal stomach with the side port at the GE junction. Recommend advancing 3-5 cm for optimal positioning. 2. Redemonstration of heterogeneous bibasilar opacities compatible with a pneumonia in the setting of COVID-19. Electronically Signed   By: 05/03/2020 M.D.   On: 05/01/2020 15:24         Scheduled Meds: . albuterol  2 puff Inhalation TID  . insulin aspart  0-9 Units Subcutaneous Q4H  . insulin detemir  10 Units Subcutaneous QHS  . linagliptin  5 mg Per Tube Daily  . metoprolol tartrate  2.5 mg Intravenous Q6H  . multivitamin  15 mL Per Tube Daily  . pantoprazole (PROTONIX) IV  40 mg Intravenous Q24H  . QUEtiapine  25 mg Oral QHS   Continuous Infusions:    LOS: 13 days    Time spent: 30 minutes    Dorcas Carrow, MD [facility] Pager [phone]

## 2020-05-02 NOTE — Progress Notes (Signed)
Patient calm and resting in bed, appears very comfortable on her side, soft wrist restraints removed.

## 2020-05-03 LAB — CBC WITH DIFFERENTIAL/PLATELET
Abs Immature Granulocytes: 0.16 10*3/uL — ABNORMAL HIGH (ref 0.00–0.07)
Basophils Absolute: 0 10*3/uL (ref 0.0–0.1)
Basophils Relative: 0 %
Eosinophils Absolute: 0.4 10*3/uL (ref 0.0–0.5)
Eosinophils Relative: 4 %
HCT: 45.6 % (ref 36.0–46.0)
Hemoglobin: 15.3 g/dL — ABNORMAL HIGH (ref 12.0–15.0)
Immature Granulocytes: 2 %
Lymphocytes Relative: 13 %
Lymphs Abs: 1.3 10*3/uL (ref 0.7–4.0)
MCH: 27.4 pg (ref 26.0–34.0)
MCHC: 33.6 g/dL (ref 30.0–36.0)
MCV: 81.6 fL (ref 80.0–100.0)
Monocytes Absolute: 0.6 10*3/uL (ref 0.1–1.0)
Monocytes Relative: 6 %
Neutro Abs: 7.7 10*3/uL (ref 1.7–7.7)
Neutrophils Relative %: 75 %
Platelets: 126 10*3/uL — ABNORMAL LOW (ref 150–400)
RBC: 5.59 MIL/uL — ABNORMAL HIGH (ref 3.87–5.11)
RDW: 14.4 % (ref 11.5–15.5)
WBC: 10.2 10*3/uL (ref 4.0–10.5)
nRBC: 0 % (ref 0.0–0.2)

## 2020-05-03 LAB — GLUCOSE, CAPILLARY
Glucose-Capillary: 123 mg/dL — ABNORMAL HIGH (ref 70–99)
Glucose-Capillary: 148 mg/dL — ABNORMAL HIGH (ref 70–99)
Glucose-Capillary: 150 mg/dL — ABNORMAL HIGH (ref 70–99)
Glucose-Capillary: 120 mg/dL — ABNORMAL HIGH (ref 70–99)
Glucose-Capillary: 164 mg/dL — ABNORMAL HIGH (ref 70–99)

## 2020-05-03 MED ORDER — DEXTROSE-NACL 5-0.9 % IV SOLN: INTRAVENOUS | Status: DC

## 2020-05-03 MED ORDER — HALOPERIDOL LACTATE 5 MG/ML IJ SOLN
2.0000 mg | Freq: Four times a day (QID) | INTRAMUSCULAR | Status: DC | PRN
  Administered 2020-05-03 – 2020-05-06 (×5): 2 mg via INTRAVENOUS
  Filled 2020-05-03 (×5): qty 1

## 2020-05-03 MED ORDER — METOPROLOL TARTRATE 25 MG PO TABS
25.0000 mg | ORAL_TABLET | Freq: Two times a day (BID) | ORAL | Status: DC
  Administered 2020-05-03 – 2020-05-08 (×8): 25 mg via ORAL
  Filled 2020-05-03 (×10): qty 1

## 2020-05-03 NOTE — Progress Notes (Signed)
PROGRESS NOTE    Charlene Parker  KGU:[id number] DOB: 1951/07/23 DOA: 04/19/2020 PCP: Jaclyn Shaggy, MD    Brief Narrative:  69 year old female with type 2 diabetes on insulin, hypertension, GERD and morbid obesity presented from infusion clinic with hypoxemia to 84% and shortness of breath. Took first dose of Moderna vaccine on 8/23 Covid-like symptoms on 8/27, tested positive on 8/28. Presented to [facility] on 9/3 with shortness of breath cough and substernal chest pain as well as generalized weakness.  Discharged home to attend infusion clinic for monoclonal antibody.  Found hypoxic and confused and sent to ER and admitted on 9/5. At the emergency room, confused.  Blood sugars 59.  81% on room air initially requiring 6 L of oxygen.  Procalcitonin normal.  Multifocal pneumonia. Patient was treated with baricitinib, Solu-Medrol and remdesivir along with other supportive treatments.  Was on 15 L high flow nasal cannula.  Remained confused and disoriented requiring chemical and physical restraints. 9/15, weaned off to the room air however remains persistently agitated and confused.   Assessment & Plan:   Principal Problem:   Acute hypoxemic respiratory failure due to COVID-19 [facility]) Active Problems:   Encephalopathy due to COVID-19 virus   Hypoglycemia   Essential hypertension   Type 2 diabetes mellitus with hypoglycemia without coma (HCC)  Acute hypoxemic respiratory failure due to COVID-19 infection/COVID-19 pneumonia: Now encephalopathic.  Oxygen requirement improved and pulmonary functions improved. COVID-19 directed therapies, Steroids, finished Baricitinib, finished Remdesivir, finished Supportive treatment.  Lab Results  Component Value Date   SARSCOV2NAA Not Detected 09/10/2019  Results available from Care Everywhere.  Tested positive on 8/28.  Acute metabolic encephalopathy likely due to COVID-19 infection: Persistent confusion and encephalopathy.   CT head normal.  MRI  brain essentially normal.   Ammonia, TSH, B12, folic acid level normal.  No drugs or alcohol use.  EEG without evidence of seizure. This is probably Covid encephalopathy. Patient is somehow improving and awakening.  Start mobilizing and continue supportive treatment.  Uncontrolled type 2 diabetes with hyperglycemia, hypoglycemic on arrival: On multiple regimen at home.  A1c 7.2.  Blood sugars better controlled now on insulin regimen. We will keep on minimum insulin to avoid hypoglycemia.  Hypernatremia: Resolved.     DVT prophylaxis: Place and maintain sequential compression device Start: 05/01/20 1131,    Code Status: Full code Family Communication: Husband. Disposition Plan: Status is: Inpatient  Remains inpatient appropriate because:Unsafe d/c plan and Inpatient level of care appropriate due to severity of illness   Dispo: The patient is from: Home              Anticipated d/c is to: Unknown at this time              Anticipated d/c date is: > 3 days, still encephalopathic.              Patient currently is not medically stable to d/c.         Consultants:   Neurology on the phone  Procedures:   None  Antimicrobials:   Completed remdesivir   Subjective: Patient seen and examined.  Overnight had some confusion and agitation, she was given a dose of Ativan after dose of Seroquel last night and now she is difficult to arouse.  No other overnight events.  Objective: Vitals:   05/02/20 2357 05/03/20 0405 05/03/20 0600 05/03/20 0800  BP: 97/74 107/73 104/65   Pulse:  79 88 76  Resp: 18 20 20    Temp:  97.9 F (36.6 C) 97.7 F (36.5 C)    TempSrc: Oral Oral    SpO2:  92% 92% 95%  Weight:      Height:        Intake/Output Summary (Last 24 hours) at 05/03/2020 1128 Last data filed at 05/03/2020 0800 Gross per 24 hour  Intake 200 ml  Output 450 ml  Net -250 ml   Filed Weights   04/30/20 0445 05/01/20 0500 05/02/20 0500  Weight: 96.8 kg 96.6 kg 95.4 kg     Examination:  Physical Exam Constitutional:      Comments: Sleepy, lethargic, difficult to arouse.  Follows simple commands with difficulty.  Not in any obvious distress.  HENT:     Head: Atraumatic.  Cardiovascular:     Rate and Rhythm: Regular rhythm.  Pulmonary:     Comments: Decreased bilateral air entry.  No added sounds. Abdominal:     General: Bowel sounds are normal.     Palpations: Abdomen is soft.  Musculoskeletal:     Left lower leg: No tenderness. No edema.  Neurological:     Comments: Wakes up with very strong stimulation but falling back asleep.       Data Reviewed: I have personally reviewed following labs and imaging studies  CBC: Recent Labs  Lab 04/29/20 0505 04/30/20 0455 05/01/20 0434 05/02/20 0526 05/03/20 0355  WBC 11.0* 16.5* 15.6* 14.5* 10.2  NEUTROABS 10.0* 13.9* 13.2* 11.9* 7.7  HGB 14.8 15.0 15.4* 15.0 15.3*  HCT 45.8 46.1* 47.4* 46.4* 45.6  MCV 83.0 82.8 82.6 83.3 81.6  PLT 250 209 219 266 126*   Basic Metabolic Panel: Recent Labs  Lab 04/28/20 0441 04/29/20 0505 04/30/20 0455 05/01/20 0434 05/02/20 0526  NA 137 137 137 136 139  K 4.2 4.2 4.0 4.0 4.9  CL 105 105 105 100 103  CO2 21* 20* 23 26 25   GLUCOSE 105* 240* 93 190* 110*  BUN 10 15 20 23 18   CREATININE 0.58 0.56 0.69 0.77 0.76  CALCIUM 8.9 9.0 9.0 8.9 9.0  MG 2.3 2.4 2.5*  --  2.5*  PHOS 2.6 3.1 2.9  --  3.6   GFR: Estimated Creatinine Clearance: 74 mL/min (by C-G formula based on SCr of 0.76 mg/dL). Liver Function Tests: Recent Labs  Lab 04/28/20 0441 04/29/20 0505 04/30/20 0455 05/01/20 0434 05/02/20 0526  AST 24 16 20 19 30   ALT 26 22 23 26 22   ALKPHOS 52 60 64 78 47  BILITOT 1.0 0.9 0.8 0.6 1.8*  PROT 6.3* 6.1* 6.1* 5.9* 6.6  ALBUMIN 2.6* 2.5* 2.5* 2.4* 2.8*   No results for input(s): LIPASE, AMYLASE in the last 168 hours. Recent Labs  Lab 05/02/20 1230  AMMONIA 25   Coagulation Profile: No results for input(s): INR, PROTIME in the last 168  hours. Cardiac Enzymes: No results for input(s): CKTOTAL, CKMB, CKMBINDEX, TROPONINI in the last 168 hours. BNP (last 3 results) No results for input(s): PROBNP in the last 8760 hours. HbA1C: No results for input(s): HGBA1C in the last 72 hours. CBG: Recent Labs  Lab 05/02/20 1648 05/02/20 2016 05/02/20 2351 05/03/20 0354 05/03/20 0742  GLUCAP 149* 119* 148* 123* 148*   Lipid Profile: No results for input(s): CHOL, HDL, LDLCALC, TRIG, CHOLHDL, LDLDIRECT in the last 72 hours. Thyroid Function Tests: Recent Labs    05/01/20 0427  TSH 2.807   Anemia Panel: No results for input(s): VITAMINB12, FOLATE, FERRITIN, TIBC, IRON, RETICCTPCT in the last 72 hours. Sepsis Labs: Recent Labs  Lab 04/28/20 0441  PROCALCITON <0.10    No results found for this or any previous visit (from the past 240 hour(s)).       Radiology Studies: EEG  Result Date: 05/01/2020 Charlsie Quest, MD     05/01/2020  6:01 PM Patient Name: ANYIA GIERKE MRN: [id number] Epilepsy Attending: Charlsie Quest Referring Physician/Provider: Dr Jaymes Graff Date: 05/01/2020 Duration: 24.50 mins Patient history: 69yo F with ams. EEG to evaluate for seizure. Level of alertness: Awake AEDs during EEG study: None Technical aspects: This EEG study was done with scalp electrodes positioned according to the 10-20 International system of electrode placement. Electrical activity was acquired at a sampling rate of 500Hz  and reviewed with a high frequency filter of 70Hz  and a low frequency filter of 1Hz . EEG data were recorded continuously and digitally stored. Description: No posterior dominant rhythm was seen. EEG showed continuous generalized 3 to 6 Hz theta-delta slowing.  Hyperventilation and photic stimulation were not performed.   ABNORMALITY -Continuous slow, generalized IMPRESSION: This study is suggestive of moderate diffuse encephalopathy, nonspecific etiology. No seizures or epileptiform discharges were seen  throughout the recording.   DG Abd 1 View  Result Date: 05/01/2020 CLINICAL DATA:  NG tube placement EXAM: ABDOMEN - 1 VIEW COMPARISON:  Abdominal radiograph 04/28/2020, CTa chest 04/26/2020 FINDINGS: Transesophageal tube tip terminates in the left upper quadrant the region of the proximal stomach with the side port at the GE junction. Recommend advancing 3-5 cm for optimal positioning. Redemonstration of the heterogeneous bibasilar opacities. Cardiomediastinal contours are similar to prior. Surgical clips again seen in the upper abdomen. Air-filled appearance of the colon is nonspecific no high-grade obstructive small bowel gas pattern is seen. Multilevel degenerative changes in the spine and SI joints. Mild levocurvature of the spine. IMPRESSION: 1. Transesophageal tube tip terminates in the left upper quadrant the region of the proximal stomach with the side port at the GE junction. Recommend advancing 3-5 cm for optimal positioning. 2. Redemonstration of heterogeneous bibasilar opacities compatible with a pneumonia in the setting of COVID-19. Electronically Signed   By: 05/03/2020 M.D.   On: 05/01/2020 15:24   MR BRAIN WO CONTRAST  Result Date: 05/02/2020 CLINICAL DATA:  69 year old female with recent COVID-19. Metabolic encephalopathy. Persistent altered mental status. EXAM: MRI HEAD WITHOUT CONTRAST TECHNIQUE: Multiplanar, multiecho pulse sequences of the brain and surrounding structures were obtained without intravenous contrast. COMPARISON:  Head CT 04/28/2020 and earlier. FINDINGS: Study is intermittently degraded by motion artifact despite repeated imaging attempts. Brain: No restricted diffusion to suggest acute infarction. No midline shift, mass effect, evidence of mass lesion, ventriculomegaly, extra-axial collection or acute intracranial hemorrhage. Cervicomedullary junction and pituitary are within normal limits. Allowing for motion artifact gray and white matter signal  appears largely normal for age throughout the brain. Mild nonspecific white matter signal changes are evident. But the deep gray nuclei, brainstem and cerebellum seem to remain normal. No cortical encephalomalacia or chronic cerebral blood products are identified. Vascular: Major intracranial vascular flow voids are preserved. Skull and upper cervical spine: Visualized bone marrow signal is within normal limits. Sinuses/Orbits: Negative. Other: Mastoids are clear. Grossly negative scalp and face soft tissues. IMPRESSION: Motion degraded exam with no acute intracranial abnormality identified. Grossly negative for age noncontrast MRI appearance of the brain. Electronically Signed   By: 05/04/2020 M.D.   On: 05/02/2020 19:39        Scheduled Meds: . albuterol  2 puff Inhalation TID  .  insulin aspart  0-9 Units Subcutaneous Q4H  . insulin detemir  10 Units Subcutaneous QHS  . metoprolol tartrate  25 mg Oral BID  . pantoprazole (PROTONIX) IV  40 mg Intravenous Q24H  . QUEtiapine  25 mg Oral QHS   Continuous Infusions:    LOS: 14 days    Time spent: 30 minutes    Dorcas Carrow, MD [facility] Pager [phone]

## 2020-05-03 NOTE — Progress Notes (Signed)
Patient with first confirmed positive test on 04/11/2020.  Now on room air. 22 days from original diagnosis with improvement of her general COVID-19 related pulmonary symptoms.  Plan: Discontinue airborne isolation, standard precautions. We will allow her husband to visit as patient will be out of isolation window.

## 2020-05-03 NOTE — Progress Notes (Signed)
Physical Therapy Re-evaluation Patient Details Name: Charlene Parker MRN: [id number] DOB: 08/18/1950 Today's Date: 05/03/2020    History of Present Illness  69 y.o. female with PMHx HTN, GERD, Diabetes who presents to the ED today via EMS for SOB. Pt received her first dose of Moderna vaccine on Monday 08/23; she began developing COVID like symptoms on 08/27 then tested positive on 08/28. Pt went to [facility] ED on 09/03 for SOB, generalized weakness, substernal chest pain/epigastric pain, and cough. She was treated with LR after being found to be tachycardic. She was well appearing afterwards and was discharged home. Pt went to the Infusion Clinic 9/5 and found to be hypoxic at 84% on RA and confused. Pt transferred vis EMS to [facility]    PT Comments    Pt remains lethargic. She kept her eyes closed for most of session. She did respond briefly to therapist calling her name. Attempted bed mobility but pt did not follow commands/put forth effort to participate. Will continue to follow and progress activity as able.    Follow Up Recommendations  SNF     Equipment Recommendations   (continuing to assess)    Recommendations for Other Services       Precautions / Restrictions Precautions Precautions: Fall    Mobility  Bed Mobility Overal bed mobility: Needs Assistance             General bed mobility comments: Total assist to move pt's LEs off bed. However, pt did not follow commands or participate with attempts to sit up  Transfers                    Ambulation/Gait                 Stairs             Wheelchair Mobility    Modified Rankin (Stroke Patients Only)       Balance Overall balance assessment: Needs assistance                                          Cognition Arousal/Alertness: Lethargic   Overall Cognitive Status: No family/caregiver present to determine baseline cognitive functioning                    Orientation Level: Disoriented to;Place;Time;Situation             General Comments: Pt did not follow any commands. Kept eyes closed      Exercises      General Comments        Pertinent Vitals/Pain Pain Assessment: Faces Faces Pain Scale: No hurt    Home Living                      Prior Function            PT Goals (current goals can now be found in the care plan section) Progress towards PT goals: Not progressing toward goals - comment    Frequency    Min 2X/week      PT Plan Current plan remains appropriate    Co-evaluation              AM-PAC PT "6 Clicks" Mobility   Outcome Measure  Help needed turning from your back to your side while in a flat bed without using bedrails?: Total Help  needed moving from lying on your back to sitting on the side of a flat bed without using bedrails?: Total Help needed moving to and from a bed to a chair (including a wheelchair)?: Total Help needed standing up from a chair using your arms (e.g., wheelchair or bedside chair)?: Total Help needed to walk in hospital room?: Total Help needed climbing 3-5 steps with a railing? : Total 6 Click Score: 6    End of Session   Activity Tolerance:  (limited by cognition, level of arousal) Patient left: in bed;with call bell/phone within reach;with bed alarm set   PT Visit Diagnosis: Muscle weakness (generalized) (M62.81);Difficulty in walking, not elsewhere classified (R26.2)     Time: 4801-6553 PT Time Calculation (min) (ACUTE ONLY): 14 min  Charges:                            Faye Ramsay, PT Acute Rehabilitation  Office: [phone] Pager: [phone]

## 2020-05-04 LAB — GLUCOSE, CAPILLARY
Glucose-Capillary: 149 mg/dL — ABNORMAL HIGH (ref 70–99)
Glucose-Capillary: 150 mg/dL — ABNORMAL HIGH (ref 70–99)
Glucose-Capillary: 174 mg/dL — ABNORMAL HIGH (ref 70–99)
Glucose-Capillary: 185 mg/dL — ABNORMAL HIGH (ref 70–99)
Glucose-Capillary: 196 mg/dL — ABNORMAL HIGH (ref 70–99)

## 2020-05-04 MED ORDER — BOOST / RESOURCE BREEZE PO LIQD CUSTOM
1.0000 | ORAL | Status: DC
  Administered 2020-05-06 – 2020-05-08 (×3): 1 via ORAL

## 2020-05-04 MED ORDER — QUETIAPINE FUMARATE 25 MG PO TABS
25.0000 mg | ORAL_TABLET | Freq: Every day | ORAL | Status: DC
  Administered 2020-05-04: 25 mg via ORAL
  Filled 2020-05-04: qty 1

## 2020-05-04 MED ORDER — ADULT MULTIVITAMIN W/MINERALS CH
1.0000 | ORAL_TABLET | Freq: Every day | ORAL | Status: DC
  Administered 2020-05-06 – 2020-05-08 (×3): 1 via ORAL
  Filled 2020-05-04 (×3): qty 1

## 2020-05-04 MED ORDER — ENSURE ENLIVE PO LIQD
237.0000 mL | ORAL | Status: DC
  Administered 2020-05-04 – 2020-05-07 (×4): 237 mL via ORAL

## 2020-05-04 NOTE — Progress Notes (Signed)
Pt has become more alert and awake this evening. Pt is oriented to self only (Name and DOB). She knows she is not at her "home" but is unsure of where she is right now. This RN was able to feed her one full cup of orange sherbert ice cream per her request. Pt requesting to get up and use the bathroom. RN replaced purewick. This RN sat with patient while she chose what she wanted to watch on TV. Per her request she wanted a movie on. All needs addressed.

## 2020-05-04 NOTE — Progress Notes (Signed)
Nutrition Follow-up  DOCUMENTATION CODES:   Obesity unspecified  INTERVENTION:  - will order Magic Cup with lunch and dinner meals, each supplement provides 290 kcal and 9 grams of protein. - will order Ensure Enlive once/day, each supplement provides 350 kcal and 20 grams of protein. - will order Boost Breeze once/day, each supplement provides 250 kcal and 9 grams of protein. - will order 1 tablet multivitamin with minerals. - will attempt NFPE at follow-up.    NUTRITION DIAGNOSIS:   Increased nutrient needs related to acute illness, catabolic illness (COVID-19 infection) as evidenced by estimated needs -ongoing  GOAL:   Patient will meet greater than or equal to 90% of their needs -unmet  MONITOR:   PO intake, Supplement acceptance, Labs, Weight trends  ASSESSMENT:   69 year old female with a medical history of type 2 DM, HTN, GERD, and morbid obesity. She presented to the ED from the COVID infusion clinic with hypoxemia to 84% and shortness of breath. Patient had her first Moderna vaccine on 8/23, developed Covid-like symptoms on 8/27, and tested positive for COVID-19 on 8/28. She presented to [facility] on 9/3 with SOB, cough, substernal chest pain and generalized weakness. Her husband has also been found to be COVID-19 positive. She was admitted for acute hypoxemic respiratory failure due to COVID-19 infection, acute metabolic encephalopathy, and hypoglycemia. She was on 50L HFNC at the time of admission.  Weight has been stable since 9/9. Patient had NGT placed on 9/14 and TF initiated that date. NGT was found out by RN on 9/17 despite patient being in soft restraints. Since that time, documented meal intakes are 0% of lunch on 9/18; 25% of dinner on 9/19; 0% of lunch today.  Patient in bed, sleeping, without hospital gown on, no family or visitors present. She is now off of COVID-related precautions.   Able to talk with RN. She reports that patient's mentation and ability to follow  commands or interact is much better today than when she cared for patient on 9/16.   RN states that patient was refusing PO meds and offer of something to eat and even sips of water this AM. She states she and MD are hopeful that within the next few days mentation will be better and patient will begin eating. MD does not want NGT replaced at this time.  RN does not feel patient would tolerate NGT replacement well and/or see would pull another tube out.    Labs reviewed; CBGs: 174, 185, 196 mg/dl.  Medications reviewed; sliding scale novolog, 10 units levemir/day, 40 mg IV protonix/day.  IVF; D5-NS @ 125 ml/hr (510 kcal).   Diet Order:   Diet Order            Diet regular Room service appropriate? Yes; Fluid consistency: Thin  Diet effective now                 EDUCATION NEEDS:   No education needs have been identified at this time  Skin:  Skin Assessment: Skin Integrity Issues: Skin Integrity Issues:: Other (Comment) Other: MASD to bilateral lower breasts  Last BM:  9/17  Height:   Ht Readings from Last 1 Encounters:  04/25/20 5\' 3"  (1.6 m)    Weight:   Wt Readings from Last 1 Encounters:  05/02/20 95.4 kg     Estimated Nutritional Needs:  Kcal:  1800-2000 kcal Protein:  80-90 grams Fluid:  >/= 1.8 L/day     05/04/20, MS, RD, LDN, CNSC Inpatient Clinical Dietitian RD  pager # available in [place]  After hours/weekend pager # available in [facility]

## 2020-05-04 NOTE — Progress Notes (Signed)
  Speech Language Pathology Treatment: Dysphagia  Patient Details Name: Charlene Parker MRN: [id number] DOB: [birth year] Today's Date: 05/04/2020 Time: 4825-0037 SLP Time Calculation (min) (ACUTE ONLY): 15 min  Assessment / Plan / Recommendation Clinical Impression  Pt found lying on her stomach and she did not follow directions to help with bed mobility and rather appears to push against this writer and the tech with attempts to reposition.  SLP was able with total cues to get pt to consume several straw boluses of cranberry juice - timely swallow with no indication of aspiration/airway compromise. She only took one small bolus of applesauce with prolonged oral manipulation/transition but no retention.     Pt's mentation remains her greatest barrier to po intake.  When pt spoke, her voice was clear but she was dysarthric and confused. She asked where we were and asked for [facility]?  But did not answer questions to Dwight's relation to her, etc.    Recommend continue po - but change to full liquids with strict precautions to mitigate aspiration risk.    Will follow up briefly however if pt's intake does not improve, ? if she would benefit from a palliative meeting given her multiple comorbidities.  Messaged MD to request diet change to full liquids - await return message.    HPI HPI: 69 year old female with past medical history of hypertension, GERD, diabetes who presented to the ED via EMS with shortness of breath.  Patient received her first dose of the Moderna vaccine on Monday 8/23 however began having Covid-like symptoms on 8/27 and tested positive at 8/28.  She went to [facility] on 9/30 for shortness of breath, generalized weakness and substernal chest pain and cough and was treated with LR.  She was discharged home from the ED.  She was directed to go to the infusion clinic to get monoclonal antibody however when she went to the infusion clinic this morning she was found to be hypoxic on room air at  84%.  Upon arrival, EMS initially placed her on NRB however she was weaned to 4 L/min.        SLP Plan  Continue with current plan of care       Recommendations  Diet recommendations: Thin liquid (full liquids) Medication Administration: Other (Comment) (crushed or whole with puree as pt tolerates) Supervision: Full supervision/cueing for compensatory strategies Compensations: Slow rate;Small sips/bites Postural Changes and/or Swallow Maneuvers: Upright 30-60 min after meal;Seated upright 90 degrees                Oral Care Recommendations: Oral care BID Follow up Recommendations: Other (comment) (TBD) SLP Visit Diagnosis: Dysphagia, unspecified (R13.10) Plan: Continue with current plan of care       GO               Rolena Infante, MS [facility] SLP Acute Rehab Services Office [phone]  Chales Abrahams 05/04/2020, 2:02 PM

## 2020-05-04 NOTE — Progress Notes (Signed)
Pt's husband, Karren Burly, is at the bedside and has his son, Samson Frederic, on speaker phone while they request an update from this RN. RN updated family on patient's current condition. All questions addressed.

## 2020-05-04 NOTE — Progress Notes (Signed)
PROGRESS NOTE    Charlene Parker  [id number]:[id number] DOB: 07/15/1951 DOA: 04/19/2020 PCP: Jaclyn Shaggy, MD    Brief Narrative:  70 year old female with type 2 diabetes on insulin, hypertension, GERD and morbid obesity presented from infusion clinic with hypoxemia to 84% and shortness of breath. Took first dose of Moderna vaccine on 8/23 Covid-like symptoms on 8/27, tested positive on 8/28. Presented to [facility] on 9/3 with shortness of breath cough and substernal chest pain as well as generalized weakness.  Discharged home to attend infusion clinic for monoclonal antibody.  Found hypoxic and confused and sent to ER and admitted on 9/5. At the emergency room, confused.  Blood sugars 59.  81% on room air initially requiring 6 L of oxygen.  Procalcitonin normal.  Multifocal pneumonia. Patient was treated with baricitinib, Solu-Medrol and remdesivir along with other supportive treatments.  Was on 15 L high flow nasal cannula.  Remained confused and disoriented requiring chemical and physical restraints. 9/15, weaned off to the room air however remains persistently agitated and confused. 9/20, off isolation.  Respiratory symptoms improved.  Encephalopathic with waxing and waning symptoms.   Assessment & Plan:   Principal Problem:   Acute hypoxemic respiratory failure due to COVID-19 [facility]) Active Problems:   Encephalopathy due to COVID-19 virus   Hypoglycemia   Essential hypertension   Type 2 diabetes mellitus with hypoglycemia without coma (HCC)  Acute hypoxemic respiratory failure due to COVID-19 infection/COVID-19 pneumonia: Now encephalopathic.  Oxygen requirement improved and pulmonary functions improved. COVID-19 directed therapies, Steroids, finished Baricitinib, finished Remdesivir, finished Supportive treatment.  Lab Results  Component Value Date   SARSCOV2NAA Not Detected 09/10/2019  Results available from Care Everywhere.  Tested positive on 8/28.  Acute metabolic encephalopathy  likely due to COVID-19 infection: Persistent confusion and encephalopathy.   CT head normal.  MRI brain essentially normal.   Ammonia, TSH, B12, folic acid level normal.  No drugs or alcohol use.  EEG without evidence of seizure. This is probably Covid encephalopathy. Patient is somehow improving and awakening.   Avoiding all sedating medications. Haldol as needed. Maintenance fluid until good oral intake. We will use Seroquel at night to stabilize mood and delirium.  Uncontrolled type 2 diabetes with hyperglycemia, hypoglycemic on arrival: On multiple regimen at home.  A1c 7.2.  Blood sugars better controlled now on insulin regimen. We will keep on minimum insulin to avoid hypoglycemia.  Hypernatremia: Resolved.     DVT prophylaxis: Place and maintain sequential compression device Start: 05/01/20 1131,    Code Status: Full code Family Communication: Husband on the phone.  He is allowed to visit. Disposition Plan: Status is: Inpatient  Remains inpatient appropriate because:Unsafe d/c plan and Inpatient level of care appropriate due to severity of illness   Dispo: The patient is from: Home              Anticipated d/c is to: Unknown at this time              Anticipated d/c date is: > 3 days, still with waxing and waning symptoms.  She needs to mobilize with therapists before making discharge plan.              Patient currently is not medically stable to d/c.    Consultants:   Neurology on the phone  Procedures:   None  Antimicrobials:   Completed remdesivir   Subjective: Seen and examined.  Sometimes wakes up and eat some food, then goes back to sleep.  Responded to greetings then stopped talking.  Objective: Vitals:   05/03/20 2144 05/04/20 0454 05/04/20 0747 05/04/20 1306  BP: 101/73 (!) 150/71  (!) 131/57  Pulse: 82 72  77  Resp: 18 18  16   Temp: 97.9 F (36.6 C)   97.9 F (36.6 C)  TempSrc: Oral   Oral  SpO2: 93%  92% 95%  Weight:      Height:         Intake/Output Summary (Last 24 hours) at 05/04/2020 1308 Last data filed at 05/04/2020 0500 Gross per 24 hour  Intake 1010.18 ml  Output 350 ml  Net 660.18 ml   Filed Weights   04/30/20 0445 05/01/20 0500 05/02/20 0500  Weight: 96.8 kg 96.6 kg 95.4 kg    Examination:  Physical Exam Constitutional:      Comments: Mostly sleepy and quiet, wakes up at times with clear conversation then stops talking.  HENT:     Head: Atraumatic.  Cardiovascular:     Rate and Rhythm: Regular rhythm.  Pulmonary:     Comments: Decreased bilateral air entry.  No added sounds. Abdominal:     General: Bowel sounds are normal.     Palpations: Abdomen is soft.  Musculoskeletal:     Left lower leg: No tenderness. No edema.  Neurological:     Comments: Wakes up with very strong stimulation but falling back asleep.       Data Reviewed: I have personally reviewed following labs and imaging studies  CBC: Recent Labs  Lab 04/29/20 0505 04/30/20 0455 05/01/20 0434 05/02/20 0526 05/03/20 0355  WBC 11.0* 16.5* 15.6* 14.5* 10.2  NEUTROABS 10.0* 13.9* 13.2* 11.9* 7.7  HGB 14.8 15.0 15.4* 15.0 15.3*  HCT 45.8 46.1* 47.4* 46.4* 45.6  MCV 83.0 82.8 82.6 83.3 81.6  PLT 250 209 219 266 126*   Basic Metabolic Panel: Recent Labs  Lab 04/28/20 0441 04/29/20 0505 04/30/20 0455 05/01/20 0434 05/02/20 0526  NA 137 137 137 136 139  K 4.2 4.2 4.0 4.0 4.9  CL 105 105 105 100 103  CO2 21* 20* 23 26 25   GLUCOSE 105* 240* 93 190* 110*  BUN 10 15 20 23 18   CREATININE 0.58 0.56 0.69 0.77 0.76  CALCIUM 8.9 9.0 9.0 8.9 9.0  MG 2.3 2.4 2.5*  --  2.5*  PHOS 2.6 3.1 2.9  --  3.6   GFR: Estimated Creatinine Clearance: 74 mL/min (by C-G formula based on SCr of 0.76 mg/dL). Liver Function Tests: Recent Labs  Lab 04/28/20 0441 04/29/20 0505 04/30/20 0455 05/01/20 0434 05/02/20 0526  AST 24 16 20 19 30   ALT 26 22 23 26 22   ALKPHOS 52 60 64 78 47  BILITOT 1.0 0.9 0.8 0.6 1.8*  PROT 6.3* 6.1* 6.1*  5.9* 6.6  ALBUMIN 2.6* 2.5* 2.5* 2.4* 2.8*   No results for input(s): LIPASE, AMYLASE in the last 168 hours. Recent Labs  Lab 05/02/20 1230  AMMONIA 25   Coagulation Profile: No results for input(s): INR, PROTIME in the last 168 hours. Cardiac Enzymes: No results for input(s): CKTOTAL, CKMB, CKMBINDEX, TROPONINI in the last 168 hours. BNP (last 3 results) No results for input(s): PROBNP in the last 8760 hours. HbA1C: No results for input(s): HGBA1C in the last 72 hours. CBG: Recent Labs  Lab 05/03/20 1640 05/03/20 2146 05/04/20 0048 05/04/20 0454 05/04/20 0742  GLUCAP 120* 164* 174* 185* 196*   Lipid Profile: No results for input(s): CHOL, HDL, LDLCALC, TRIG, CHOLHDL, LDLDIRECT in the last  72 hours. Thyroid Function Tests: No results for input(s): TSH, T4TOTAL, FREET4, T3FREE, THYROIDAB in the last 72 hours. Anemia Panel: No results for input(s): VITAMINB12, FOLATE, FERRITIN, TIBC, IRON, RETICCTPCT in the last 72 hours. Sepsis Labs: Recent Labs  Lab 04/28/20 0441  PROCALCITON <0.10    No results found for this or any previous visit (from the past 240 hour(s)).       Radiology Studies: MR BRAIN WO CONTRAST  Result Date: 05/02/2020 CLINICAL DATA:  69 year old female with recent COVID-19. Metabolic encephalopathy. Persistent altered mental status. EXAM: MRI HEAD WITHOUT CONTRAST TECHNIQUE: Multiplanar, multiecho pulse sequences of the brain and surrounding structures were obtained without intravenous contrast. COMPARISON:  Head CT 04/28/2020 and earlier. FINDINGS: Study is intermittently degraded by motion artifact despite repeated imaging attempts. Brain: No restricted diffusion to suggest acute infarction. No midline shift, mass effect, evidence of mass lesion, ventriculomegaly, extra-axial collection or acute intracranial hemorrhage. Cervicomedullary junction and pituitary are within normal limits. Allowing for motion artifact gray and white matter signal appears  largely normal for age throughout the brain. Mild nonspecific white matter signal changes are evident. But the deep gray nuclei, brainstem and cerebellum seem to remain normal. No cortical encephalomalacia or chronic cerebral blood products are identified. Vascular: Major intracranial vascular flow voids are preserved. Skull and upper cervical spine: Visualized bone marrow signal is within normal limits. Sinuses/Orbits: Negative. Other: Mastoids are clear. Grossly negative scalp and face soft tissues. IMPRESSION: Motion degraded exam with no acute intracranial abnormality identified. Grossly negative for age noncontrast MRI appearance of the brain. Electronically Signed   By: Odessa Fleming M.D.   On: 05/02/2020 19:39        Scheduled Meds: . albuterol  2 puff Inhalation TID  . insulin aspart  0-9 Units Subcutaneous Q4H  . insulin detemir  10 Units Subcutaneous QHS  . metoprolol tartrate  25 mg Oral BID  . pantoprazole (PROTONIX) IV  40 mg Intravenous Q24H   Continuous Infusions: . dextrose 5 % and 0.9% NaCl 125 mL/hr at 05/04/20 1225     LOS: 15 days    Time spent: 30 minutes    Dorcas Carrow, MD [facility] Pager [phone]

## 2020-05-04 NOTE — TOC Progression Note (Signed)
Transition of Care [facility]) - Progression Note    Patient Details  Name: Charlene Parker MRN: [id number] Date of Birth: 04-May-1951  Transition of Care [facility]) CM/SW Contact  Geni Bers, RN Phone Number: 05/04/2020, 12:54 PM  Clinical Narrative:    Pt not stable for discharge per MD. TOC will continue to for.    Expected Discharge Plan: Skilled Nursing Facility Barriers to Discharge: No Barriers Identified  Expected Discharge Plan and Services Expected Discharge Plan: Skilled Nursing Facility                                               Social Determinants of Health (SDOH) Interventions    Readmission Risk Interventions No flowsheet data found.

## 2020-05-04 NOTE — Plan of Care (Signed)
  Problem: Coping: Goal: Psychosocial and spiritual needs will be supported Outcome: Progressing   Problem: Respiratory: Goal: Will maintain a patent airway Outcome: Progressing Goal: Complications related to the disease process, condition or treatment will be avoided or minimized Outcome: Progressing   Problem: Clinical Measurements: Goal: Ability to maintain clinical measurements within normal limits will improve Outcome: Progressing Goal: Will remain free from infection Outcome: Progressing Goal: Diagnostic test results will improve Outcome: Progressing Goal: Respiratory complications will improve Outcome: Progressing Goal: Cardiovascular complication will be avoided Outcome: Progressing   Problem: Nutrition: Goal: Adequate nutrition will be maintained Outcome: Not Progressing   Problem: Coping: Goal: Level of anxiety will decrease Outcome: Progressing   Problem: Elimination: Goal: Will not experience complications related to bowel motility Outcome: Progressing Goal: Will not experience complications related to urinary retention Outcome: Progressing   Problem: Pain Managment: Goal: General experience of comfort will improve Outcome: Progressing

## 2020-05-05 LAB — GLUCOSE, CAPILLARY
Glucose-Capillary: 107 mg/dL — ABNORMAL HIGH (ref 70–99)
Glucose-Capillary: 117 mg/dL — ABNORMAL HIGH (ref 70–99)
Glucose-Capillary: 135 mg/dL — ABNORMAL HIGH (ref 70–99)
Glucose-Capillary: 164 mg/dL — ABNORMAL HIGH (ref 70–99)
Glucose-Capillary: 206 mg/dL — ABNORMAL HIGH (ref 70–99)
Glucose-Capillary: 217 mg/dL — ABNORMAL HIGH (ref 70–99)
Glucose-Capillary: 232 mg/dL — ABNORMAL HIGH (ref 70–99)
Glucose-Capillary: 67 mg/dL — ABNORMAL LOW (ref 70–99)
Glucose-Capillary: 88 mg/dL (ref 70–99)

## 2020-05-05 MED ORDER — PANTOPRAZOLE SODIUM 40 MG PO TBEC
40.0000 mg | DELAYED_RELEASE_TABLET | Freq: Every day | ORAL | Status: DC
  Administered 2020-05-06 – 2020-05-08 (×3): 40 mg via ORAL
  Filled 2020-05-05 (×3): qty 1

## 2020-05-05 MED ORDER — INSULIN DETEMIR 100 UNIT/ML ~~LOC~~ SOLN
5.0000 [IU] | Freq: Every day | SUBCUTANEOUS | Status: DC
  Administered 2020-05-05 – 2020-05-07 (×3): 5 [IU] via SUBCUTANEOUS
  Filled 2020-05-05 (×4): qty 0.05

## 2020-05-05 MED ORDER — QUETIAPINE FUMARATE 25 MG PO TABS
25.0000 mg | ORAL_TABLET | Freq: Two times a day (BID) | ORAL | Status: DC
  Administered 2020-05-05 – 2020-05-08 (×6): 25 mg via ORAL
  Filled 2020-05-05 (×6): qty 1

## 2020-05-05 MED ORDER — ALBUTEROL SULFATE HFA 108 (90 BASE) MCG/ACT IN AERS: 2.0000 | INHALATION_SPRAY | Freq: Four times a day (QID) | RESPIRATORY_TRACT | Status: DC | PRN

## 2020-05-05 NOTE — Evaluation (Signed)
Occupational Therapy Re-Evaluation Patient Details Name: Charlene Parker MRN: [id number] DOB: Nov 29, 1950 Today's Date: 05/05/2020    History of Present Illness 69 y.o. female with PMHx HTN, GERD, Diabetes who presents to the ED today via EMS for SOB. Pt received her first dose of Moderna vaccine on Monday 08/23; she began developing COVID like symptoms on 08/27 then tested positive on 08/28. Pt went to [facility] ED on 09/03 for SOB, generalized weakness, substernal chest pain/epigastric pain, and cough. She was treated with LR after being found to be tachycardic. She was well appearing afterwards and was discharged home. Pt went to the Infusion Clinic 9/5 and found to be hypoxic at 84% on RA and confused. Pt transferred vis EMS to [facility].   Clinical Impression   Pt admitted with the above. Pt currently with functional limitations due to the deficits listed below (see OT Problem List).  Pt will benefit from skilled OT to increase their safety and independence with ADL and functional mobility for ADL to facilitate discharge to venue listed below.   Pt with limited participation.  OT reordered - will place pt on trial of OT to assess ability to make progress.  Pt did follow 1 step commands this day.        Follow Up Recommendations  Supervision/Assistance - 24 hour;SNF    Equipment Recommendations  Other (comment) (defer to next venue)    Recommendations for Other Services       Precautions / Restrictions Precautions Precautions: Fall      Mobility Bed Mobility Overal bed mobility: Needs Assistance Bed Mobility: Rolling Rolling: Max assist            Transfers      NT                    ADL either performed or assessed with clinical judgement   ADL Overall ADL's : Needs assistance/impaired     Grooming: Bed level;Maximal assistance                                 General ADL Comments: pt did wash face  with wash cloth.  Pt did follow 1  step commands but seemed agitated.  RN and Student present and working with settling patient     Vision Patient Visual Report: No change from baseline        Cognition Arousal/Alertness: (P) Awake/alert (sleepy but arouses easily) Behavior During Therapy: (P) Flat affect;Impulsive Overall Cognitive Status: (P) Impaired/Different from baseline Area of Impairment: (P) Attention;Following commands;Safety/judgement;Awareness                 Orientation Level: (P) Disoriented to;Place;Time;Situation Current Attention Level: (P) Focused   Following Commands: (P) Follows one step commands with increased time;Follows multi-step commands inconsistently Safety/Judgement: (P) Decreased awareness of safety;Decreased awareness of deficits     General Comments: (P) pt more alert today, confused but verbally interactive. verbalizes she wants to be OOB   General Comments               Home Living Family/patient expects to be discharged to:: Skilled nursing facility                                                 OT Problem List: Decreased  activity tolerance;Cardiopulmonary status limiting activity;Decreased cognition;Decreased safety awareness;Obesity      OT Treatment/Interventions: Self-care/ADL training;Therapeutic exercise;Energy conservation;DME and/or AE instruction;Therapeutic activities;Cognitive remediation/compensation;Patient/family education;Balance training    OT Goals(Current goals can be found in the care plan section) Acute Rehab OT Goals Patient Stated Goal: none stated  OT Frequency: Min 2X/week   Barriers to Parker/C:               AM-PAC OT "6 Clicks" Daily Activity     Outcome Measure Help from another person eating meals?: Total Help from another person taking care of personal grooming?: A Lot Help from another person toileting, which includes using toliet, bedpan, or urinal?: Total Help from another person bathing (including washing,  rinsing, drying)?: Total Help from another person to put on and taking off regular upper body clothing?: Total Help from another person to put on and taking off regular lower body clothing?: Total 6 Click Score: 7   End of Session Equipment Utilized During Treatment:  (restraints) Nurse Communication: Other (comment) (currently restless; following no commands)  Activity Tolerance: Treatment limited secondary to agitation (Treatment limited secondary to AMS) Patient left: in bed;with call bell/phone within reach;with nursing/sitter in room (restraints)  OT Visit Diagnosis: Other abnormalities of gait and mobility (R26.89);Other symptoms and signs involving cognitive function;Unsteadiness on feet (R26.81);Muscle weakness (generalized) (M62.81)                Time: 1600-1620 OT Time Calculation (min): 20 min Charges:  OT General Charges $OT Visit: 1 Visit OT Evaluation $OT Eval Moderate Complexity: 1 Mod  Lise Auer, OT Acute Rehabilitation Services [phone] Office- [phone]     Charlene Parker, Charlene Parker 05/05/2020, 4:28 PM

## 2020-05-05 NOTE — Progress Notes (Signed)
Physical Therapy Treatment Patient Details Name: Charlene Parker MRN: [id number] DOB: 07-Nov-1950 Today's Date: 05/05/2020    History of Present Illness 69 y.o. female with PMHx HTN, GERD, Diabetes who presents to the ED today via EMS for SOB. Pt received her first dose of Moderna vaccine on Monday 08/23; she began developing COVID like symptoms on 08/27 then tested positive on 08/28. Pt went to [facility] ED on 09/03 for SOB, generalized weakness, substernal chest pain/epigastric pain, and cough. She was treated with LR after being found to be tachycardic. She was well appearing afterwards and was discharged home. Pt went to the Infusion Clinic 9/5 and found to be hypoxic at 84% on RA and confused. Pt transferred vis EMS to [facility].    PT Comments    Pt  More alert today, able to participate with PT in transitioning to EOB, +2 assist for OOB to chair.  Pt continues to be confused (oriented to self) although cognition much better overall, cooperative during session, able to follow some one step functional commands. Continue to recommend SNF post acute   Follow Up Recommendations  SNF     Equipment Recommendations  None recommended by PT    Recommendations for Other Services       Precautions / Restrictions Precautions Precautions: Fall Restrictions Weight Bearing Restrictions: No    Mobility  Bed Mobility Overal bed mobility: Needs Assistance Bed Mobility: Supine to Sit     Supine to sit: Mod assist;+2 for safety/equipment;+2 for physical assistance;HOB elevated     General bed mobility comments: assist to initiate and complete LEs off bed, assist to elevate trunk fully, incr time and multi-modal cues needed.   Transfers Overall transfer level: Needs assistance   Transfers: Lateral/Scoot Transfers          Lateral/Scoot Transfers: +2 physical assistance;+2 safety/equipment;Max assist;Mod assist General transfer comment: cues to self assist. pt was able to participate  with anterior-superior wt shift to laterally scoot, bed pad utilized for safe transition  Ambulation/Gait                 Stairs             Wheelchair Mobility    Modified Rankin (Stroke Patients Only)       Balance                                            Cognition Arousal/Alertness: Awake/alert (sleepy but arouses easily) Behavior During Therapy: Flat affect;Impulsive Overall Cognitive Status: Impaired/Different from baseline Area of Impairment: Attention;Following commands;Safety/judgement;Awareness                 Orientation Level: Disoriented to;Place;Time;Situation Current Attention Level: Focused   Following Commands: Follows one step commands with increased time;Follows multi-step commands inconsistently Safety/Judgement: Decreased awareness of safety;Decreased awareness of deficits     General Comments: pt more alert today, confused but verbally interactive. verbalizes she wants to be Neurosurgeon - Lower Extremity Long Arc Quad: AROM;Both;5 reps    General Comments        Pertinent Vitals/Pain Pain Assessment: Faces Faces Pain Scale: No hurt    Home Living                      Prior Function  PT Goals (current goals can now be found in the care plan section) Acute Rehab PT Goals Patient Stated Goal: none stated PT Goal Formulation: Patient unable to participate in goal setting Time For Goal Achievement: 05/12/20 Potential to Achieve Goals: Good Progress towards PT goals: Progressing toward goals    Frequency    Min 2X/week      PT Plan Current plan remains appropriate    Co-evaluation              AM-PAC PT "6 Clicks" Mobility   Outcome Measure  Help needed turning from your back to your side while in a flat bed without using bedrails?: A Lot Help needed moving from lying on your back to sitting on the side of a flat bed without using  bedrails?: A Lot Help needed moving to and from a bed to a chair (including a wheelchair)?: A Lot Help needed standing up from a chair using your arms (e.g., wheelchair or bedside chair)?: Total Help needed to walk in hospital room?: Total   6 Click Score: 8    End of Session   Activity Tolerance: Patient tolerated treatment well Patient left: in chair;with call bell/phone within reach;with chair alarm set   PT Visit Diagnosis: Muscle weakness (generalized) (M62.81);Difficulty in walking, not elsewhere classified (R26.2)     Time: 7412-8786 PT Time Calculation (min) (ACUTE ONLY): 32 min  Charges:  $Therapeutic Activity: 23-37 mins                     Delice Bison, PT  Acute Rehab Dept ([facility]) [phone] Pager [phone]  05/05/2020    [facility] 05/05/2020, 1:22 PM

## 2020-05-05 NOTE — Progress Notes (Signed)
PROGRESS NOTE    Charlene Parker  IDP:[id number] DOB: 12-02-1950 DOA: 04/19/2020 PCP: Jaclyn Shaggy, MD    Brief Narrative:  69 year old female with type 2 diabetes on insulin, hypertension, GERD and morbid obesity presented from infusion clinic with hypoxemia to 84% and shortness of breath. Took first dose of Moderna vaccine on 8/23 Covid-like symptoms on 8/27, tested positive on 8/28. Presented to [facility] on 9/3 with shortness of breath cough and substernal chest pain as well as generalized weakness.  Discharged home to attend infusion clinic for monoclonal antibody.  Found hypoxic and confused and sent to ER and admitted on 9/5. At the emergency room, confused.  Blood sugars 59.  81% on room air initially requiring 6 L of oxygen.  Procalcitonin normal.  Multifocal pneumonia. Patient was treated with baricitinib, Solu-Medrol and remdesivir along with other supportive treatments.  Was on 15 L high flow nasal cannula.  Remained confused and disoriented requiring chemical and physical restraints. 9/15, weaned off to the room air however remains persistently agitated and confused. 9/20, off isolation.  Respiratory symptoms improved.  Encephalopathic with waxing and waning symptoms.   Assessment & Plan:   Principal Problem:   Acute hypoxemic respiratory failure due to COVID-19 [facility]) Active Problems:   Encephalopathy due to COVID-19 virus   Hypoglycemia   Essential hypertension   Type 2 diabetes mellitus with hypoglycemia without coma (HCC)  Acute hypoxemic respiratory failure due to COVID-19 infection/COVID-19 pneumonia: Now encephalopathic.  Oxygen requirement improved and pulmonary functions improved. COVID-19 directed therapies, Steroids, finished Baricitinib, finished Remdesivir, finished Supportive treatment.  Lab Results  Component Value Date   SARSCOV2NAA Not Detected 09/10/2019  Results available from Care Everywhere.  Tested positive on 8/28.  Acute metabolic encephalopathy  likely due to COVID-19 infection: Persistent confusion and encephalopathy.   CT head normal.  MRI brain essentially normal.   Ammonia, TSH, B12, folic acid level normal.  No drugs or alcohol use.  EEG without evidence of seizure. This is probably Covid encephalopathy. Patient is somehow improving and awakening intermittently. Avoiding all sedating medications. Haldol as needed. Maintenance fluid until good oral intake. Some response to nighttime Seroquel.  Increase Seroquel 25 mg twice daily. Patient does have poor oral intake, however will avoid feeding tube today, she was able to intermittently eat with help.  She is pulling on lines, feeding tube insertion will need either restraints or sedation that we will try to avoid. Start working with PT OT as much as she is able to do it.  Uncontrolled type 2 diabetes with hyperglycemia, hypoglycemic on arrival: On multiple regimen at home.  A1c 7.2.  Blood sugars better controlled now on insulin regimen. We will keep on minimum insulin to avoid hypoglycemia.  Hypernatremia: Resolved.     DVT prophylaxis: Place and maintain sequential compression device Start: 05/01/20 1131,    Code Status: Full code Family Communication: Husband on the phone.  Disposition Plan: Status is: Inpatient  Remains inpatient appropriate because:Unsafe d/c plan and Inpatient level of care appropriate due to severity of illness   Dispo: The patient is from: Home              Anticipated d/c is to: Unknown at this time              Anticipated d/c date is: > 3 days, still with waxing and waning symptoms.  She needs to mobilize with therapists before making discharge plan.  Patient currently is not medically stable to d/c.    Consultants:   Neurology on the phone  Procedures:   None  Antimicrobials:   Completed remdesivir   Subjective: Patient seen and examined.  Since last 24 hours, she had occasionally awaken, she ate some dinner last  night.  Her husband was able to visit her last night and she was able to recognize him. Today morning, she pulled her lines, she looked tremulous and anxious.  Clear voice, however not communicating.  Disoriented.  Afebrile.  On room air. Objective: Vitals:   05/04/20 2014 05/04/20 2236 05/05/20 0500 05/05/20 0558  BP: 129/62 118/76  133/73  Pulse: 86 86  68  Resp: (!) 24   18  Temp: 97.6 F (36.4 C)   97.6 F (36.4 C)  TempSrc: Oral   Oral  SpO2: 94% 96%  100%  Weight:   95.4 kg   Height:        Intake/Output Summary (Last 24 hours) at 05/05/2020 1148 Last data filed at 05/05/2020 0245 Gross per 24 hour  Intake 995 ml  Output 300 ml  Net 695 ml   Filed Weights   05/01/20 0500 05/02/20 0500 05/05/20 0500  Weight: 96.6 kg 95.4 kg 95.4 kg    Examination:  Physical Exam Constitutional:      Comments: Sleepy and tremulous, disoriented.  Responds to greeting and then stops talking.  Looks comfortable when resting.  On room air.  HENT:     Head: Atraumatic.  Cardiovascular:     Rate and Rhythm: Regular rhythm.  Pulmonary:     Comments: Bilateral clear.  No added sounds. Abdominal:     General: Bowel sounds are normal.     Palpations: Abdomen is soft.  Musculoskeletal:     Left lower leg: No tenderness. No edema.  Neurological:     Comments: Moves all extremities.  Pulls on lines. She is alert on his stimulation but not oriented.  Intermittent agitation.       Data Reviewed: I have personally reviewed following labs and imaging studies  CBC: Recent Labs  Lab 04/29/20 0505 04/30/20 0455 05/01/20 0434 05/02/20 0526 05/03/20 0355  WBC 11.0* 16.5* 15.6* 14.5* 10.2  NEUTROABS 10.0* 13.9* 13.2* 11.9* 7.7  HGB 14.8 15.0 15.4* 15.0 15.3*  HCT 45.8 46.1* 47.4* 46.4* 45.6  MCV 83.0 82.8 82.6 83.3 81.6  PLT 250 209 219 266 126*   Basic Metabolic Panel: Recent Labs  Lab 04/29/20 0505 04/30/20 0455 05/01/20 0434 05/02/20 0526  NA 137 137 136 139  K 4.2 4.0 4.0  4.9  CL 105 105 100 103  CO2 20* 23 26 25   GLUCOSE 240* 93 190* 110*  BUN 15 20 23 18   CREATININE 0.56 0.69 0.77 0.76  CALCIUM 9.0 9.0 8.9 9.0  MG 2.4 2.5*  --  2.5*  PHOS 3.1 2.9  --  3.6   GFR: Estimated Creatinine Clearance: 74 mL/min (by C-G formula based on SCr of 0.76 mg/dL). Liver Function Tests: Recent Labs  Lab 04/29/20 0505 04/30/20 0455 05/01/20 0434 05/02/20 0526  AST 16 20 19 30   ALT 22 23 26 22   ALKPHOS 60 64 78 47  BILITOT 0.9 0.8 0.6 1.8*  PROT 6.1* 6.1* 5.9* 6.6  ALBUMIN 2.5* 2.5* 2.4* 2.8*   No results for input(s): LIPASE, AMYLASE in the last 168 hours. Recent Labs  Lab 05/02/20 1230  AMMONIA 25   Coagulation Profile: No results for input(s): INR, PROTIME in the last 168  hours. Cardiac Enzymes: No results for input(s): CKTOTAL, CKMB, CKMBINDEX, TROPONINI in the last 168 hours. BNP (last 3 results) No results for input(s): PROBNP in the last 8760 hours. HbA1C: No results for input(s): HGBA1C in the last 72 hours. CBG: Recent Labs  Lab 05/05/20 0441 05/05/20 0510 05/05/20 0606 05/05/20 0743 05/05/20 1121  GLUCAP 67* 88 117* 135* 164*   Lipid Profile: No results for input(s): CHOL, HDL, LDLCALC, TRIG, CHOLHDL, LDLDIRECT in the last 72 hours. Thyroid Function Tests: No results for input(s): TSH, T4TOTAL, FREET4, T3FREE, THYROIDAB in the last 72 hours. Anemia Panel: No results for input(s): VITAMINB12, FOLATE, FERRITIN, TIBC, IRON, RETICCTPCT in the last 72 hours. Sepsis Labs: No results for input(s): PROCALCITON, LATICACIDVEN in the last 168 hours.  No results found for this or any previous visit (from the past 240 hour(s)).       Radiology Studies: No results found.      Scheduled Meds: . albuterol  2 puff Inhalation TID  . feeding supplement  1 Container Oral Q24H  . feeding supplement (ENSURE ENLIVE)  237 mL Oral Q24H  . insulin aspart  0-9 Units Subcutaneous Q4H  . insulin detemir  5 Units Subcutaneous QHS  . metoprolol  tartrate  25 mg Oral BID  . multivitamin with minerals  1 tablet Oral Daily  . pantoprazole  40 mg Oral Daily  . QUEtiapine  25 mg Oral BID   Continuous Infusions: . dextrose 5 % and 0.9% NaCl 125 mL/hr at 05/04/20 1225     LOS: 16 days    Time spent: 30 minutes    Dorcas Carrow, MD [facility] Pager [phone]

## 2020-05-05 NOTE — Progress Notes (Signed)
Pt continues to have increased confusion. Pt currently wanting to "go outside to help her because it's so pretty," this RN made pt aware of current rainy weather conditions and reoriented pt. CNA sitting in room with pt to talk to her. RN and CNA changed and repositioned pt.

## 2020-05-06 LAB — CBC WITH DIFFERENTIAL/PLATELET
Abs Immature Granulocytes: 0.04 10*3/uL (ref 0.00–0.07)
Basophils Absolute: 0 10*3/uL (ref 0.0–0.1)
Basophils Relative: 0 %
Eosinophils Absolute: 0.7 10*3/uL — ABNORMAL HIGH (ref 0.0–0.5)
Eosinophils Relative: 9 %
HCT: 44.3 % (ref 36.0–46.0)
Hemoglobin: 14.2 g/dL (ref 12.0–15.0)
Immature Granulocytes: 1 %
Lymphocytes Relative: 16 %
Lymphs Abs: 1.2 10*3/uL (ref 0.7–4.0)
MCH: 27.3 pg (ref 26.0–34.0)
MCHC: 32.1 g/dL (ref 30.0–36.0)
MCV: 85.2 fL (ref 80.0–100.0)
Monocytes Absolute: 0.5 10*3/uL (ref 0.1–1.0)
Monocytes Relative: 7 %
Neutro Abs: 5.1 10*3/uL (ref 1.7–7.7)
Neutrophils Relative %: 67 %
Platelets: 169 10*3/uL (ref 150–400)
RBC: 5.2 MIL/uL — ABNORMAL HIGH (ref 3.87–5.11)
RDW: 15.4 % (ref 11.5–15.5)
WBC: 7.5 10*3/uL (ref 4.0–10.5)
nRBC: 0 % (ref 0.0–0.2)

## 2020-05-06 LAB — GLUCOSE, CAPILLARY
Glucose-Capillary: 135 mg/dL — ABNORMAL HIGH (ref 70–99)
Glucose-Capillary: 139 mg/dL — ABNORMAL HIGH (ref 70–99)
Glucose-Capillary: 165 mg/dL — ABNORMAL HIGH (ref 70–99)
Glucose-Capillary: 180 mg/dL — ABNORMAL HIGH (ref 70–99)
Glucose-Capillary: 184 mg/dL — ABNORMAL HIGH (ref 70–99)
Glucose-Capillary: 221 mg/dL — ABNORMAL HIGH (ref 70–99)

## 2020-05-06 LAB — COMPREHENSIVE METABOLIC PANEL
ALT: 29 U/L (ref 0–44)
AST: 21 U/L (ref 15–41)
Albumin: 2.6 g/dL — ABNORMAL LOW (ref 3.5–5.0)
Alkaline Phosphatase: 43 U/L (ref 38–126)
Anion gap: 7 (ref 5–15)
BUN: 9 mg/dL (ref 8–23)
CO2: 21 mmol/L — ABNORMAL LOW (ref 22–32)
Calcium: 9.1 mg/dL (ref 8.9–10.3)
Chloride: 113 mmol/L — ABNORMAL HIGH (ref 98–111)
Creatinine, Ser: 0.66 mg/dL (ref 0.44–1.00)
GFR calc Af Amer: >60 mL/min (ref >60)
GFR calc non Af Amer: >60 mL/min (ref >60)
Glucose, Bld: 171 mg/dL — ABNORMAL HIGH (ref 70–99)
Potassium: 3.8 mmol/L (ref 3.5–5.1)
Sodium: 141 mmol/L (ref 135–145)
Total Bilirubin: 1.1 mg/dL (ref 0.3–1.2)
Total Protein: 5.9 g/dL — ABNORMAL LOW (ref 6.5–8.1)

## 2020-05-06 LAB — PHOSPHORUS: Phosphorus: 2.3 mg/dL — ABNORMAL LOW (ref 2.5–4.6)

## 2020-05-06 LAB — MAGNESIUM: Magnesium: 2.1 mg/dL (ref 1.7–2.4)

## 2020-05-06 NOTE — Progress Notes (Signed)
PROGRESS NOTE    Charlene Parker  JOA:[id number] DOB: 10/12/1950 DOA: 04/19/2020 PCP: Jaclyn Shaggy, MD    Brief Narrative:  Patient admitted to the hospital with a working diagnosis of acute hypoxic respiratory failure due to SARS COVID-19 viral pneumonia.  69 year old female with past medical history for hypertension, GERD, type 2 diabetes mellitus.  She had 9 days of generalized weakness, cough, dyspnea and substernal chest pain.  On 9/30 she was diagnosed with COVID-19.  She was referred to be outpatient infusion clinic to get monoclonal antibody treatment.  At the infusion clinic her oxygenation was 84% she was placed on 4 L of supplemental oxygen per nasal cannula and transferred to the emergency department.  On her initial physical examination blood pressure was 125/75, heart rate 101, respiratory rate 28, temperature 98.7, oxygen saturation 96%.  Her lungs were clear to auscultation no wheezing, heart S1-S2, present rhythmic, soft abdomen, no lower extremity edema.  Chest radiograph with bilateral lower lobe infiltrates, interstitial, more prominent on the left side, positive right upper lobe interstitial infiltrate.   Patient had high oxygen requirements, up to 15 L per high flow nasal cannula, she received barcitinib, steroids and remdesivir.  She has developed persistent encephalopathy, confusion and agitation.  Assessment & Plan:   Principal Problem:   Acute hypoxemic respiratory failure due to COVID-19 [facility]) Active Problems:   Encephalopathy due to COVID-19 virus   Hypoglycemia   Essential hypertension   Type 2 diabetes mellitus with hypoglycemia without coma (HCC)   1. Acute hypoxic respiratory failure due to COVID 19 viral pneumonia.  Patient recovering well from pneumonia, now she is on room air and denies any dyspnea or cough.  She completed therapy with remdesivir, baricitinib and systemic steroids.   2. Acute metabolic encephalopathy likely triggered by COVID 19  viral infection. Today has been more calm but continue to be confused and disorientated. No nausea or vomiting.  Continue with as needed haldol and seroquel. Continue mobility with PT and ot.   3. T2DM uncontrolled with hyper and hypoglycemia. Continue insulin therapy and close glucose monitoring.   4, Hypernatremia. Clinically has resolved,     Status is: Inpatient  Remains inpatient appropriate because:Unsafe d/c plan   Dispo:  Patient From: Home  Planned Disposition: Home with Health Care Svc  Expected discharge date: 05/08/20  Medically stable for discharge: No    DVT prophylaxis: Enoxaparin   Code Status:   full  Family Communication:  No family at the bedside      Nutrition Status: Nutrition Problem: Increased nutrient needs Etiology: acute illness, catabolic illness (COVID-19 infection) Signs/Symptoms: estimated needs Interventions: Hormel Shake, MVI, Other (Comment) (Prosource Plus)      Subjective: Patient is calm and cooperative, but confused and disorientated.   Objective: Vitals:   05/05/20 2044 05/06/20 0003 05/06/20 0414 05/06/20 0500  BP: (!) 101/47 (!) 124/53 (!) 160/75   Pulse: 72 (!) 59 75   Resp: 20 (!) 22 (!) 24   Temp: 97.7 F (36.5 C) 97.8 F (36.6 C) 97.7 F (36.5 C)   TempSrc: Oral Oral Oral   SpO2: 97% 94% 93%   Weight:    97 kg  Height:        Intake/Output Summary (Last 24 hours) at 05/06/2020 1353 Last data filed at 05/06/2020 1000 Gross per 24 hour  Intake --  Output 4 ml  Net -4 ml   Filed Weights   05/02/20 0500 05/05/20 0500 05/06/20 0500  Weight: 95.4 kg  95.4 kg 97 kg    Examination:   General: Not in pain or dyspnea, deconditioned  Neurology: Awake and alert, non focal confused and disorientated.  E ENT: no pallor, no icterus, oral mucosa moist Cardiovascular: No JVD. S1-S2 present, rhythmic, no gallops, rubs, or murmurs. No lower extremity edema. Pulmonary: positive breath sounds bilaterally, adequate air  movement, no wheezing, rhonchi or rales. Gastrointestinal. Abdomen soft and non tender Skin. No rashes Musculoskeletal: no joint deformities     Data Reviewed: I have personally reviewed following labs and imaging studies  CBC: Recent Labs  Lab 04/30/20 0455 05/01/20 0434 05/02/20 0526 05/03/20 0355 05/06/20 0613  WBC 16.5* 15.6* 14.5* 10.2 7.5  NEUTROABS 13.9* 13.2* 11.9* 7.7 5.1  HGB 15.0 15.4* 15.0 15.3* 14.2  HCT 46.1* 47.4* 46.4* 45.6 44.3  MCV 82.8 82.6 83.3 81.6 85.2  PLT 209 219 266 126* 169   Basic Metabolic Panel: Recent Labs  Lab 04/30/20 0455 05/01/20 0434 05/02/20 0526 05/06/20 0613  NA 137 136 139 141  K 4.0 4.0 4.9 3.8  CL 105 100 103 113*  CO2 23 26 25  21*  GLUCOSE 93 190* 110* 171*  BUN 20 23 18 9   CREATININE 0.69 0.77 0.76 0.66  CALCIUM 9.0 8.9 9.0 9.1  MG 2.5*  --  2.5* 2.1  PHOS 2.9  --  3.6 2.3*   GFR: Estimated Creatinine Clearance: 74.6 mL/min (by C-G formula based on SCr of 0.66 mg/dL). Liver Function Tests: Recent Labs  Lab 04/30/20 0455 05/01/20 0434 05/02/20 0526 05/06/20 0613  AST 20 19 30 21   ALT 23 26 22 29   ALKPHOS 64 78 47 43  BILITOT 0.8 0.6 1.8* 1.1  PROT 6.1* 5.9* 6.6 5.9*  ALBUMIN 2.5* 2.4* 2.8* 2.6*   No results for input(s): LIPASE, AMYLASE in the last 168 hours. Recent Labs  Lab 05/02/20 1230  AMMONIA 25   Coagulation Profile: No results for input(s): INR, PROTIME in the last 168 hours. Cardiac Enzymes: No results for input(s): CKTOTAL, CKMB, CKMBINDEX, TROPONINI in the last 168 hours. BNP (last 3 results) No results for input(s): PROBNP in the last 8760 hours. HbA1C: No results for input(s): HGBA1C in the last 72 hours. CBG: Recent Labs  Lab 05/05/20 1938 05/05/20 2359 05/06/20 0409 05/06/20 0813 05/06/20 1144  GLUCAP 206* 139* 165* 184* 221*   Lipid Profile: No results for input(s): CHOL, HDL, LDLCALC, TRIG, CHOLHDL, LDLDIRECT in the last 72 hours. Thyroid Function Tests: No results for  input(s): TSH, T4TOTAL, FREET4, T3FREE, THYROIDAB in the last 72 hours. Anemia Panel: No results for input(s): VITAMINB12, FOLATE, FERRITIN, TIBC, IRON, RETICCTPCT in the last 72 hours.    Radiology Studies: I have reviewed all of the imaging during this hospital visit personally     Scheduled Meds: . feeding supplement  1 Container Oral Q24H  . feeding supplement (ENSURE ENLIVE)  237 mL Oral Q24H  . insulin aspart  0-9 Units Subcutaneous Q4H  . insulin detemir  5 Units Subcutaneous QHS  . metoprolol tartrate  25 mg Oral BID  . multivitamin with minerals  1 tablet Oral Daily  . pantoprazole  40 mg Oral Daily  . QUEtiapine  25 mg Oral BID   Continuous Infusions: . dextrose 5 % and 0.9% NaCl 125 mL/hr at 05/06/20 1344     LOS: 17 days        Charlene Parker 05/08/20, MD

## 2020-05-06 NOTE — Progress Notes (Signed)
  Speech Language Pathology Treatment:    Patient Details Name: Charlene Parker MRN: [id number] DOB: 05-21-1951 Today's Date: 05/06/2020 Time: 1810-1830 SLP Time Calculation (min) (ACUTE ONLY): 20 min  Assessment / Plan / Recommendation Clinical Impression  Pt today seen to assess po tolerance, readiness for dietary advancement, etc.  She is fully alert and feeding herself without evidence of dysarthria but does not consistently follow directions.   Cough noted at baseline and during intake inconsistently. RN confirms pt has been coughing throughout the day today without intake. SLP observed pt consuming graham cracker, graham cracker with peanut butter, magic cup, pudding and broth.  Again, dry cough observed with and without intake but pt is demonstrating significant improvement.   Recommend to advance diet to dys3/thin with strict precautions.  Pt has a strong voice with clear quality and strong cough which are good indicators of airway protection.  Will follow up for dietary tolerance.  RN, NT and pt made aware and signs updated.    HPI HPI: 69 year old female with past medical history of hypertension, GERD, diabetes who presented to the ED via EMS with shortness of breath.  Patient received her first dose of the Moderna vaccine on Monday 8/23 however began having Covid-like symptoms on 8/27 and tested positive at 8/28.  She went to [facility] on 9/30 for shortness of breath, generalized weakness and substernal chest pain and cough and was treated with LR.  She was discharged home from the ED.  She was directed to go to the infusion clinic to get monoclonal antibody however when she went to the infusion clinic this morning she was found to be hypoxic on room air at 84%.  Upon arrival, EMS initially placed her on NRB however she was weaned to 4 L/min.        SLP Plan  Continue with current plan of care       Recommendations  Diet recommendations: Dysphagia 3 (mechanical soft);Thin  liquid Liquids provided via: Cup;Straw Medication Administration: Other (Comment) (crushed or whole with puree as pt tolerates) Supervision:  (set up assist) Compensations: Slow rate;Small sips/bites Postural Changes and/or Swallow Maneuvers: Upright 30-60 min after meal;Seated upright 90 degrees                Oral Care Recommendations: Oral care BID Follow up Recommendations: Other (comment) (TBD) SLP Visit Diagnosis: Dysphagia, unspecified (R13.10) Plan: Continue with current plan of care       GO              Rolena Infante, MS [facility] SLP Acute Rehab Services Office [phone]  Chales Abrahams 05/06/2020, 8:33 PM

## 2020-05-07 LAB — COMPREHENSIVE METABOLIC PANEL
ALT: 25 U/L (ref 0–44)
AST: 18 U/L (ref 15–41)
Albumin: 2.6 g/dL — ABNORMAL LOW (ref 3.5–5.0)
Alkaline Phosphatase: 46 U/L (ref 38–126)
Anion gap: 11 (ref 5–15)
BUN: 7 mg/dL — ABNORMAL LOW (ref 8–23)
CO2: 22 mmol/L (ref 22–32)
Calcium: 9.2 mg/dL (ref 8.9–10.3)
Chloride: 112 mmol/L — ABNORMAL HIGH (ref 98–111)
Creatinine, Ser: 0.78 mg/dL (ref 0.44–1.00)
GFR calc Af Amer: >60 mL/min (ref >60)
GFR calc non Af Amer: >60 mL/min (ref >60)
Glucose, Bld: 175 mg/dL — ABNORMAL HIGH (ref 70–99)
Potassium: 3.7 mmol/L (ref 3.5–5.1)
Sodium: 145 mmol/L (ref 135–145)
Total Bilirubin: 1.1 mg/dL (ref 0.3–1.2)
Total Protein: 5.7 g/dL — ABNORMAL LOW (ref 6.5–8.1)

## 2020-05-07 LAB — GLUCOSE, CAPILLARY
Glucose-Capillary: 139 mg/dL — ABNORMAL HIGH (ref 70–99)
Glucose-Capillary: 140 mg/dL — ABNORMAL HIGH (ref 70–99)
Glucose-Capillary: 142 mg/dL — ABNORMAL HIGH (ref 70–99)
Glucose-Capillary: 181 mg/dL — ABNORMAL HIGH (ref 70–99)
Glucose-Capillary: 203 mg/dL — ABNORMAL HIGH (ref 70–99)
Glucose-Capillary: 221 mg/dL — ABNORMAL HIGH (ref 70–99)
Glucose-Capillary: 231 mg/dL — ABNORMAL HIGH (ref 70–99)

## 2020-05-07 MED ORDER — HALOPERIDOL LACTATE 5 MG/ML IJ SOLN
1.0000 mg | Freq: Four times a day (QID) | INTRAMUSCULAR | Status: DC | PRN
  Administered 2020-05-07: 1 mg via INTRAVENOUS
  Filled 2020-05-07: qty 1

## 2020-05-07 NOTE — Progress Notes (Signed)
Physical Therapy Treatment Patient Details Name: Charlene Parker MRN: [id number] DOB: 06/23/1951 Today's Date: 05/07/2020    History of Present Illness 69 y.o. female with PMHx HTN, GERD, Diabetes who presents to the ED today via EMS for SOB. Pt received her first dose of Moderna vaccine on Monday 08/23; she began developing COVID like symptoms on 08/27 then tested positive on 08/28. Pt went to [facility] ED on 09/03 for SOB, generalized weakness, substernal chest pain/epigastric pain, and cough. She was treated with LR after being found to be tachycardic. She was well appearing afterwards and was discharged home. Pt went to the Infusion Clinic 9/5 and found to be hypoxic at 84% on RA and confused. Pt transferred vis EMS to [facility].    PT Comments    Pt assisted with ambulating in room today!  Pt overall min assist for mobility at this time.  Pt fatigued quickly and assisted back to bed.  Continue to recommend SNF upon d/c.  Follow Up Recommendations  SNF     Equipment Recommendations  None recommended by PT    Recommendations for Other Services       Precautions / Restrictions Precautions Precautions: Fall    Mobility  Bed Mobility Overal bed mobility: Needs Assistance Bed Mobility: Sit to Supine       Sit to supine: Mod assist   General bed mobility comments: assist for LEs  Transfers Overall transfer level: Needs assistance Equipment used: Rolling walker (2 wheeled) Transfers: Sit to/from Stand Sit to Stand: Min assist         General transfer comment: multimodal cues for hand placement and safe technique  Ambulation/Gait Ambulation/Gait assistance: Min assist;+2 safety/equipment Gait Distance (Feet): 9 Feet Assistive device: Rolling walker (2 wheeled) Gait Pattern/deviations: Step-through pattern;Decreased stride length     General Gait Details: pt appeared to wish to ambulate however circled around room and then attempting to sit down on bed, pt does  report fatigue from being up in chair today; fell asleep quickly upon return to bed   Stairs             Wheelchair Mobility    Modified Rankin (Stroke Patients Only)       Balance Overall balance assessment: Needs assistance         Standing balance support: Bilateral upper extremity supported Standing balance-Leahy Scale: Poor Standing balance comment: reliant on UE support                            Cognition Arousal/Alertness: Awake/alert Behavior During Therapy: Flat affect;Impulsive Overall Cognitive Status: Impaired/Different from baseline Area of Impairment: Attention;Following commands;Safety/judgement;Awareness                 Orientation Level: Disoriented to;Place;Time;Situation Current Attention Level: Focused   Following Commands: Follows one step commands with increased time;Follows multi-step commands inconsistently Safety/Judgement: Decreased awareness of safety;Decreased awareness of deficits     General Comments: pt more alert today, confused, verbally interactive but nonsensical      Exercises      General Comments        Pertinent Vitals/Pain Pain Assessment: Faces Faces Pain Scale: No hurt Pain Location: generalized groaning Pain Intervention(s): Repositioned;Monitored during session    Home Living                      Prior Function            PT Goals (current  goals can now be found in the care plan section) Progress towards PT goals: Progressing toward goals    Frequency    Min 2X/week      PT Plan Current plan remains appropriate    Co-evaluation              AM-PAC PT "6 Clicks" Mobility   Outcome Measure  Help needed turning from your back to your side while in a flat bed without using bedrails?: A Little Help needed moving from lying on your back to sitting on the side of a flat bed without using bedrails?: A Little Help needed moving to and from a bed to a chair  (including a wheelchair)?: A Little Help needed standing up from a chair using your arms (e.g., wheelchair or bedside chair)?: A Little Help needed to walk in hospital room?: A Little Help needed climbing 3-5 steps with a railing? : A Lot 6 Click Score: 17    End of Session Equipment Utilized During Treatment: Gait belt Activity Tolerance: Patient tolerated treatment well Patient left: in bed;with call bell/phone within reach;with bed alarm set Nurse Communication: Mobility status PT Visit Diagnosis: Muscle weakness (generalized) (M62.81);Difficulty in walking, not elsewhere classified (R26.2)     Time: 2836-6294 PT Time Calculation (min) (ACUTE ONLY): 10 min  Charges:  $Gait Training: 8-22 mins                     Paulino Door, DPT Acute Rehabilitation Services Pager: [phone] Office: [phone]  Sarajane Jews 05/07/2020, 3:37 PM

## 2020-05-07 NOTE — TOC Progression Note (Signed)
Transition of Care [facility]) - Progression Note    Patient Details  Name: Charlene Parker MRN: [id number] Date of Birth: 02-29-1951  Transition of Care [facility]) CM/SW Contact  Geni Bers, RN Phone Number: 05/07/2020, 11:48 AM  Clinical Narrative:    Spoke with pt's husband concerning discharge to SNF.  Mr. Porath agreed with pt being faxed to SNF and asked for [facility] area to be first choice.    Expected Discharge Plan: Skilled Nursing Facility Barriers to Discharge: No Barriers Identified  Expected Discharge Plan and Services Expected Discharge Plan: Skilled Nursing Facility                                               Social Determinants of Health (SDOH) Interventions    Readmission Risk Interventions No flowsheet data found.

## 2020-05-07 NOTE — Progress Notes (Signed)
PROGRESS NOTE    Charlene Parker  HMC:[id number] DOB: 1951/02/26 DOA: 04/19/2020 PCP: Jaclyn Shaggy, MD    Brief Narrative:  Patient admitted to the hospital with a working diagnosis of acute hypoxic respiratory failure due to SARS COVID-19 viral pneumonia.  69 year old female with past medical history for hypertension, GERD, type 2 diabetes mellitus.  She had 9 days of generalized weakness, cough, dyspnea and substernal chest pain.  On 9/30 she was diagnosed with COVID-19.  She was referred to be outpatient infusion clinic to get monoclonal antibody treatment.  At the infusion clinic her oxygenation was 84% she was placed on 4 L of supplemental oxygen per nasal cannula and transferred to the emergency department.  On her initial physical examination blood pressure was 125/75, heart rate 101, respiratory rate 28, temperature 98.7, oxygen saturation 96%.  Her lungs were clear to auscultation no wheezing, heart S1-S2, present rhythmic, soft abdomen, no lower extremity edema.  Chest radiograph with bilateral lower lobe infiltrates, interstitial, more prominent on the left side, positive right upper lobe interstitial infiltrate.   Patient had high oxygen requirements, up to 15 L per high flow nasal cannula, she received barcitinib, steroids and remdesivir.  She has developed persistent encephalopathy, confusion and agitation   Assessment & Plan:   Principal Problem:   Acute hypoxemic respiratory failure due to COVID-19 [facility]) Active Problems:   Encephalopathy due to COVID-19 virus   Hypoglycemia   Essential hypertension   Type 2 diabetes mellitus with hypoglycemia without coma (HCC)    1. Acute hypoxic respiratory failure due to COVID 19 viral pneumonia.  Sp remdesivir, baricitinib and systemic steroids.   Her oxygenation today is 100% on room air. No apparent dyspnea or chest pain.   2. Acute metabolic encephalopathy likely triggered by COVID 19 viral infection. Last night  received haldol and this am is more somnolent. She is out of bed to chair and no restrains.  Will decrease dose of haldol to 1 mg from 2 mg and continue with quetiapine 25 mg po bid.  Discontinue IV fluids and follow with PT/OT recommendations. Patient will need SNF.   3. T2DM uncontrolled with hyper and hypoglycemia. Fasting glucose is 175, will continue sliding scale insulin for glucose cover and monitoring.  Continue basal insulin with levimir 5 units daily.   4, Hypernatremia. Na is 145 this am, will discontinue IV fluids and will follow up on renal function in am. Serum cr is 0,97 with K at 3,7 and bicarbonate at 22.    5. HTN. Continue blood pressure control with metoprolol.   Patient continue to be at high risk for  Enoxaparin   Status is: Inpatient  Remains inpatient appropriate because:Unsafe d/c plan   Dispo:  Patient From: Home  Planned Disposition: snf   Expected discharge date:  Medically stable for discharge:yes    DVT prophylaxis:  enoxaparin   Code Status:   full  Family Communication:   I spoke over the phone with the patient's husbsnd about patient's  condition, plan of care, prognosis and all questions were addressed.    Nutrition Status: Nutrition Problem: Increased nutrient needs Etiology: acute illness, catabolic illness (COVID-19 infection) Signs/Symptoms: estimated needs Interventions: Hormel Shake, MVI, Other (Comment) (Prosource Plus)     Subjective: Patient has been calm during the day yesterday, night shift with agitation and required haldol, this am is more somnolent than yesterday.   Objective: Vitals:   05/06/20 0500 05/06/20 1415 05/06/20 2010 05/07/20 0651  BP:  Marland Kitchen)  142/81 (!) 142/69 (!) 147/55  Pulse:  79 89 66  Resp:  (!) 24 16 18   Temp:  (!) 97.5 F (36.4 C) 98 F (36.7 C) 98.1 F (36.7 C)  TempSrc:  Oral Oral Oral  SpO2:  92% 96% 100%  Weight: 97 kg     Height:        Intake/Output Summary (Last 24 hours) at 05/07/2020  0910 Last data filed at 05/06/2020 1741 Gross per 24 hour  Intake 30 ml  Output --  Net 30 ml   Filed Weights   05/02/20 0500 05/05/20 0500 05/06/20 0500  Weight: 95.4 kg 95.4 kg 97 kg    Examination:   General: Not in pain or dyspnea, deconditioned  Neurology: Awake and alert, non focal  E ENT: no pallor, no icterus, oral mucosa moist Cardiovascular: No JVD. S1-S2 present, rhythmic, no gallops, rubs, or murmurs. No lower extremity edema. Pulmonary: positive breath sounds bilaterally, adequate air movement, no wheezing, rhonchi or rales. Gastrointestinal. Abdomen soft and non tender Skin. No rashes Musculoskeletal: no joint deformities     Data Reviewed: I have personally reviewed following labs and imaging studies  CBC: Recent Labs  Lab 05/01/20 0434 05/02/20 0526 05/03/20 0355 05/06/20 0613  WBC 15.6* 14.5* 10.2 7.5  NEUTROABS 13.2* 11.9* 7.7 5.1  HGB 15.4* 15.0 15.3* 14.2  HCT 47.4* 46.4* 45.6 44.3  MCV 82.6 83.3 81.6 85.2  PLT 219 266 126* 169   Basic Metabolic Panel: Recent Labs  Lab 05/01/20 0434 05/02/20 0526 05/06/20 0613 05/07/20 0756  NA 136 139 141 145  K 4.0 4.9 3.8 3.7  CL 100 103 113* 112*  CO2 26 25 21* 22  GLUCOSE 190* 110* 171* 175*  BUN 23 18 9  7*  CREATININE 0.77 0.76 0.66 0.78  CALCIUM 8.9 9.0 9.1 9.2  MG  --  2.5* 2.1  --   PHOS  --  3.6 2.3*  --    GFR: Estimated Creatinine Clearance: 74.6 mL/min (by C-G formula based on SCr of 0.78 mg/dL). Liver Function Tests: Recent Labs  Lab 05/01/20 0434 05/02/20 0526 05/06/20 0613 05/07/20 0756  AST 19 30 21 18   ALT 26 22 29 25   ALKPHOS 78 47 43 46  BILITOT 0.6 1.8* 1.1 1.1  PROT 5.9* 6.6 5.9* 5.7*  ALBUMIN 2.4* 2.8* 2.6* 2.6*   No results for input(s): LIPASE, AMYLASE in the last 168 hours. Recent Labs  Lab 05/02/20 1230  AMMONIA 25   Coagulation Profile: No results for input(s): INR, PROTIME in the last 168 hours. Cardiac Enzymes: No results for input(s): CKTOTAL, CKMB,  CKMBINDEX, TROPONINI in the last 168 hours. BNP (last 3 results) No results for input(s): PROBNP in the last 8760 hours. HbA1C: No results for input(s): HGBA1C in the last 72 hours. CBG: Recent Labs  Lab 05/06/20 1655 05/06/20 2008 05/07/20 0000 05/07/20 0518 05/07/20 0734  GLUCAP 135* 180* 142* 203* 231*   Lipid Profile: No results for input(s): CHOL, HDL, LDLCALC, TRIG, CHOLHDL, LDLDIRECT in the last 72 hours. Thyroid Function Tests: No results for input(s): TSH, T4TOTAL, FREET4, T3FREE, THYROIDAB in the last 72 hours. Anemia Panel: No results for input(s): VITAMINB12, FOLATE, FERRITIN, TIBC, IRON, RETICCTPCT in the last 72 hours.    Radiology Studies: I have reviewed all of the imaging during this hospital visit personally     Scheduled Meds: . feeding supplement  1 Container Oral Q24H  . feeding supplement (ENSURE ENLIVE)  237 mL Oral Q24H  . insulin aspart  0-9 Units Subcutaneous Q4H  . insulin detemir  5 Units Subcutaneous QHS  . metoprolol tartrate  25 mg Oral BID  . multivitamin with minerals  1 tablet Oral Daily  . pantoprazole  40 mg Oral Daily  . QUEtiapine  25 mg Oral BID   Continuous Infusions: . dextrose 5 % and 0.9% NaCl Stopped (05/06/20 1401)     LOS: 18 days        Denica Web Annett Gula, MD

## 2020-05-07 NOTE — [place] FL2 (Signed)
[place] MEDICAID FL2 LEVEL OF CARE SCREENING TOOL     IDENTIFICATION  Patient Name: Charlene Parker Birthdate: [birth year] Sex: female Admission Date (Current Location): 04/19/2020  [facility] and [place] Number:  Producer, television/film/video and Address:  [facility],  [address] [place], [place] [address]      Provider Number: [id number]  Attending Physician Name and Address:  Coralie Keens,*  Relative Name and Phone Number:  Kaliyan Osbourn [phone] Husband    Current Level of Care: Hospital Recommended Level of Care: Skilled Nursing Facility Prior Approval Number:    Date Approved/Denied:   PASRR Number: [id number]  Discharge Plan: SNF    Current Diagnoses: Patient Active Problem List   Diagnosis Date Noted   Acute hypoxemic respiratory failure due to COVID-19 [facility]) 04/19/2020   Encephalopathy due to COVID-19 virus 04/19/2020   Hypoglycemia 04/19/2020   Essential hypertension 04/19/2020   Type 2 diabetes mellitus with hypoglycemia without coma (HCC) 04/19/2020    Orientation RESPIRATION BLADDER Height & Weight     Self  Normal Incontinent Weight: 97 kg Height:  5\' 3"  (160 cm)  BEHAVIORAL SYMPTOMS/MOOD NEUROLOGICAL BOWEL NUTRITION STATUS      Incontinent Diet (DYS 3)  AMBULATORY STATUS COMMUNICATION OF NEEDS Skin   Total Care Verbally Normal                       Personal Care Assistance Level of Assistance  Bathing, Feeding, Dressing, Total care Bathing Assistance: Maximum assistance Feeding assistance: Maximum assistance Dressing Assistance: Maximum assistance Total Care Assistance: Maximum assistance   Functional Limitations Info  Sight, Hearing, Speech Sight Info: Adequate Hearing Info: Adequate Speech Info: Adequate    SPECIAL CARE FACTORS FREQUENCY  PT (By licensed PT), OT (By licensed OT)     PT Frequency: Eval and treat/5xw OT Frequency: Eval and Treat/5xw            Contractures Contractures Info: Not present     Additional Factors Info  Code Status, Allergies Code Status Info: FULL Allergies Info: Prednisone           Current Medications (05/07/2020):  This is the current hospital active medication list Current Facility-Administered Medications  Medication Dose Route Frequency Provider Last Rate Last Admin   acetaminophen (TYLENOL) tablet 650 mg  650 mg Oral Q6H PRN 05/09/2020, MD       albuterol (VENTOLIN HFA) 108 (90 Base) MCG/ACT inhaler 2 puff  2 puff Inhalation Q6H PRN Jae Dire, MD       dextrose 5 %-0.9 % sodium chloride infusion   Intravenous Continuous Dorcas Carrow, MD   Stopped at 05/06/20 1401   feeding supplement (BOOST / RESOURCE BREEZE) liquid 1 Container  1 Container Oral Q24H 05/08/20, MD   1 Container at 05/07/20 1118   feeding supplement (ENSURE ENLIVE) (ENSURE ENLIVE) liquid 237 mL  237 mL Oral Q24H 05/09/20, MD   237 mL at 05/06/20 1744   guaiFENesin-dextromethorphan (ROBITUSSIN DM) 100-10 MG/5ML syrup 10 mL  10 mL Per Tube Q4H PRN 05/08/20, MD       haloperidol lactate (HALDOL) injection 2 mg  2 mg Intravenous Q6H PRN Dorcas Carrow, MD   2 mg at 05/06/20 2039   insulin aspart (novoLOG) injection 0-9 Units  0-9 Units Subcutaneous Q4H 2040, MD   3 Units at 05/07/20 0928   insulin detemir (LEVEMIR) injection 5 Units  5 Units Subcutaneous QHS 05/09/20, MD   5  Units at 05/06/20 2142   metoprolol tartrate (LOPRESSOR) tablet 25 mg  25 mg Oral BID Dorcas Carrow, MD   25 mg at 05/07/20 1116   multivitamin with minerals tablet 1 tablet  1 tablet Oral Daily Dorcas Carrow, MD   1 tablet at 05/07/20 1116   pantoprazole (PROTONIX) EC tablet 40 mg  40 mg Oral Daily Hessie Knows, RPH   40 mg at 05/07/20 1116   QUEtiapine (SEROQUEL) tablet 25 mg  25 mg Oral BID Dorcas Carrow, MD   25 mg at 05/07/20 1116     Discharge Medications: Please see discharge summary for a list of discharge medications.  Relevant Imaging  Results:  Relevant Lab Results:   Additional Information [id number]  Geni Bers, RN

## 2020-05-08 LAB — COMPREHENSIVE METABOLIC PANEL
ALT: 24 U/L (ref 0–44)
AST: 14 U/L — ABNORMAL LOW (ref 15–41)
Albumin: 2.6 g/dL — ABNORMAL LOW (ref 3.5–5.0)
Alkaline Phosphatase: 51 U/L (ref 38–126)
Anion gap: 9 (ref 5–15)
BUN: 9 mg/dL (ref 8–23)
CO2: 20 mmol/L — ABNORMAL LOW (ref 22–32)
Calcium: 9 mg/dL (ref 8.9–10.3)
Chloride: 111 mmol/L (ref 98–111)
Creatinine, Ser: 0.78 mg/dL (ref 0.44–1.00)
GFR calc Af Amer: >60 mL/min (ref >60)
GFR calc non Af Amer: >60 mL/min (ref >60)
Glucose, Bld: 149 mg/dL — ABNORMAL HIGH (ref 70–99)
Potassium: 3.6 mmol/L (ref 3.5–5.1)
Sodium: 140 mmol/L (ref 135–145)
Total Bilirubin: 0.8 mg/dL (ref 0.3–1.2)
Total Protein: 6 g/dL — ABNORMAL LOW (ref 6.5–8.1)

## 2020-05-08 LAB — GLUCOSE, CAPILLARY
Glucose-Capillary: 131 mg/dL — ABNORMAL HIGH (ref 70–99)
Glucose-Capillary: 163 mg/dL — ABNORMAL HIGH (ref 70–99)
Glucose-Capillary: 210 mg/dL — ABNORMAL HIGH (ref 70–99)
Glucose-Capillary: 139 mg/dL — ABNORMAL HIGH (ref 70–99)
Glucose-Capillary: 135 mg/dL — ABNORMAL HIGH (ref 70–99)

## 2020-05-08 LAB — RESPIRATORY PANEL BY RT PCR (FLU A&B, COVID)
Influenza A by PCR: NEGATIVE
Influenza B by PCR: NEGATIVE
SARS Coronavirus 2 by RT PCR: NEGATIVE

## 2020-05-08 MED ORDER — PANTOPRAZOLE SODIUM 40 MG PO TBEC: 40.0000 mg | DELAYED_RELEASE_TABLET | Freq: Every day | ORAL | 0 refills | Status: DC

## 2020-05-08 MED ORDER — TRAZODONE HCL 50 MG PO TABS: 50.0000 mg | ORAL_TABLET | Freq: Every day | ORAL | Status: DC

## 2020-05-08 MED ORDER — INSULIN DETEMIR 100 UNIT/ML ~~LOC~~ SOLN: 5.0000 [IU] | Freq: Every day | SUBCUTANEOUS | 0 refills | Status: DC

## 2020-05-08 MED ORDER — ENSURE ENLIVE PO LIQD: 237.0000 mL | ORAL | 0 refills | Status: AC

## 2020-05-08 MED ORDER — INSULIN ASPART 100 UNIT/ML ~~LOC~~ SOLN: 0.0000 [IU] | SUBCUTANEOUS | 11 refills | Status: DC

## 2020-05-08 MED ORDER — ADULT MULTIVITAMIN W/MINERALS CH: 1.0000 | ORAL_TABLET | Freq: Every day | ORAL | 0 refills | Status: AC

## 2020-05-08 MED ORDER — TRAZODONE HCL 50 MG PO TABS: 50.0000 mg | ORAL_TABLET | Freq: Every day | ORAL | 0 refills | Status: DC

## 2020-05-08 NOTE — Progress Notes (Signed)
Physical Therapy Treatment Patient Details Name: Charlene Parker MRN: [id number] DOB: May 16, 1951 Today's Date: 05/08/2020    History of Present Illness 69 y.o. female with PMHx HTN, GERD, Diabetes who presents to the ED today via EMS for SOB. Pt received her first dose of Moderna vaccine on Monday 08/23; she began developing COVID like symptoms on 08/27 then tested positive on 08/28. Pt went to [facility] ED on 09/03 for SOB, generalized weakness, substernal chest pain/epigastric pain, and cough. She was treated with LR after being found to be tachycardic. She was well appearing afterwards and was discharged home. Pt went to the [facility] 9/5 and found to be hypoxic at 84% on RA and confused. Pt transferred vis EMS to [facility].    PT Comments    Pt attempting to get OOB on arrival.  Pt assisted to [facility] (she requested to use bathroom).  Pt then assisted to recliner.  Pt requiring assist for safety and decreased cognition.  Continue to recommend SNF at this time.   Follow Up Recommendations  SNF     Equipment Recommendations  None recommended by PT    Recommendations for Other Services       Precautions / Restrictions Precautions Precautions: Fall    Mobility  Bed Mobility               General bed mobility comments: pt standing from bed with bed alarm going off on arrival  Transfers Overall transfer level: Needs assistance Equipment used: Rolling walker (2 wheeled) Transfers: Sit to/from [employer] Sit to Stand: Min guard;Min assist Stand pivot transfers: Min assist       General transfer comment: multimodal cues for hand placement and safe technique; pt first assisted to recliner and pt states she was trying to get to bathroom, provided BSC and assisted pt (nurse tech in to assist after pt had BM)  Ambulation/Gait             General Gait Details: deferred as pt used BSC and then lunch arrived   Stairs             Wheelchair  Mobility    Modified Rankin (Stroke Patients Only)       Balance                                            Cognition Arousal/Alertness: Awake/alert Behavior During Therapy: Flat affect;Impulsive Overall Cognitive Status: Impaired/Different from baseline Area of Impairment: Attention;Following commands;Safety/judgement;Awareness                 Orientation Level: Disoriented to;Place;Time;Situation Current Attention Level: Focused   Following Commands: Follows one step commands with increased time;Follows multi-step commands inconsistently Safety/Judgement: Decreased awareness of safety;Decreased awareness of deficits     General Comments: alert but confused, verbally interactive however remains nonsensical      Exercises      General Comments        Pertinent Vitals/Pain Pain Assessment: Faces Faces Pain Scale: No hurt Pain Intervention(s): Repositioned    Home Living                      Prior Function            PT Goals (current goals can now be found in the care plan section) Progress towards PT goals: Progressing toward goals  Frequency    Min 2X/week      PT Plan Current plan remains appropriate    Co-evaluation              AM-PAC PT "6 Clicks" Mobility   Outcome Measure  Help needed turning from your back to your side while in a flat bed without using bedrails?: A Little Help needed moving from lying on your back to sitting on the side of a flat bed without using bedrails?: A Little Help needed moving to and from a bed to a chair (including a wheelchair)?: A Little Help needed standing up from a chair using your arms (Parker.g., wheelchair or bedside chair)?: A Little Help needed to walk in hospital room?: A Little Help needed climbing 3-5 steps with a railing? : A Lot 6 Click Score: 17    End of Session Equipment Utilized During Treatment: Gait belt Activity Tolerance: Patient tolerated treatment  well Patient left: with call bell/phone within reach;in chair;with chair alarm set;with nursing/sitter in room Nurse Communication: Mobility status PT Visit Diagnosis: Muscle weakness (generalized) (M62.81);Difficulty in walking, not elsewhere classified (R26.2)     Time: 4854-6270 PT Time Calculation (min) (ACUTE ONLY): 22 min  Charges:  $Therapeutic Activity: 8-22 mins                    Thomasene Mohair PT, DPT Acute Rehabilitation Services Pager: [phone] Office: [phone]  Charlene Parker 05/08/2020, 1:13 PM

## 2020-05-08 NOTE — Progress Notes (Signed)
[facility] Care again, someone answered but was on hold for the nurse but unable to get in touch with the nurse.

## 2020-05-08 NOTE — Progress Notes (Signed)
Patient transported via [facility], husband updated on departure.

## 2020-05-08 NOTE — TOC Progression Note (Signed)
Transition of Care [facility]) - Progression Note    Patient Details  Name: Charlene Parker MRN: [id number] Date of Birth: 08-19-1950  [facility]) CM/SW Contact  Geni Bers, RN Phone Number: 05/08/2020, 9:41 AM  Clinical Narrative:    Spoke with pt concerning pt's husband concerning discharge to [facility] in [place], [place]. Husband agreed with transfer.     Expected Discharge Plan: Skilled Nursing Facility Barriers to Discharge: No Barriers Identified  Expected Discharge Plan and Services Expected Discharge Plan: Skilled Nursing Facility                                               Social Determinants of Health (SDOH) Interventions    Readmission Risk Interventions No flowsheet data found.

## 2020-05-08 NOTE — Discharge Summary (Addendum)
Physician Discharge Summary  Charlene Parker ZOX:[id number] DOB: 02/17/1951 DOA: 04/19/2020  PCP: Jaclyn Shaggyate, Denny C, MD  Admit date: 04/19/2020 Discharge date: 05/08/2020  Admitted From: Home  Disposition:  SNF   Recommendations for Outpatient Follow-up and new medication changes:  1. Follow up with Dr. Arlana Pouchate in 7 days.  2. Patient has been placed on quetiapine for agitation during her hospitalization, now discontinued.  3. Diabetic regimen has been modified to prevent hypoglycemia.   Home Health: na  Equipment/Devices: na    Discharge Condition: stable CODE STATUS: full  Diet recommendation: heart healthy and diabetic prudent/ dysphagia 3 diet.   Brief/Interim Summary: Patient admitted to the hospital with a working diagnosis of acute hypoxic respiratory failure due to SARS COVID-19 viral pneumonia.  69 year old female with past medical history for hypertension, GERD, type 2 diabetes mellitus. She had 9 days of generalized weakness, cough, dyspnea and substernal chest pain. On 8/30 she was diagnosed with COVID-19. She was referred to be outpatient infusion clinic to get monoclonal antibody treatment. At the infusion clinic her oxygenation was 84% she was placed on 4 L of supplemental oxygen per nasal cannula and transferred to the emergency department. On her initial physical examination blood pressure was 125/75, heart rate 101, respiratory rate 28, temperature 98.7, oxygen saturation 96% on supplemental 02. Her lungs were clear to auscultation no wheezing, heart S1-S2, present rhythmic, soft abdomen, no lower extremity edema. Sodium 140, potassium 3.8, chloride 104, bicarb 23, glucose 237, BUN 21, creatinine 0.76, AST 35, ALT 27, white count 5.2, hemoglobin 14.9, hematocrit 45.9, platelets 230.  Urinalysis specific gravity 1.032, 6-10 white cells, 11-20 squamous cells, 0-5 red cells. EKG 89 bpm, left axis deviation, normal intervals, sinus rhythm, no ST segment or T wave changes, low  voltage.  Chest radiograph with bilateral lower lobe infiltrates, interstitial, more prominent on the left side, positive right upper lobe interstitial infiltrate.  Patient had high oxygen requirements, up to 15 L per high flow nasal cannula, she receivedbarcitinib, steroids and remdesivir.  She developed persistent encephalopathy, confusion and agitation. Further work up with Brain CT and MRI with no acute changes.   1.  Acute hypoxic respiratory failure due to SARS COVID-19 viral pneumonia.  Patient had a prolonged hospital stay, she was medically treated with remdesivir, baricitinib and systemic steroids. Further work-up with CT chest was negative for pulmonary embolism, it showed extensive bilateral groundglass opacities consistent with viral pneumonia.  At the time of her discharge her oximetry is 94% on room air.  2.  Acute metabolic encephalopathy, likely triggered by COVID-19 viral infection.  Patient had severe confusion,and  agitation with features of delirium.  Required medical therapy with Haldol and quetiapine. Her symptoms slowly improved, patient no further agitated and able to cooperate with care.  Patient was seen by physical therapy/Occupational Therapy with recommendations to continue care at skilled nursing facility.  Added trazodone for sleep.   3.  Type 2 diabetes mellitus, uncontrolled with hyper and hypoglycemia.  Her glucose was closely monitored during her hospitalization.  She has required 5 units of Levemir as basal insulin regimen. Continue insulin sliding scale.   Hold on oral hypoglycemic agents for now.   4.  Hypernatremia.  Patient received intravenous fluids, her p.o. intake has improved, at discharge her sodium is 140, potassium 3.6, chloride 111, bicarb 20, BUN 9, creatinine 0.78.  5.  Hypertension.  Continue blood pressure control with metoprolol.  Discharge Diagnoses:  Principal Problem:   Acute hypoxemic respiratory failure due  to COVID-19  [facility]) Active Problems:   Encephalopathy due to COVID-19 virus   Hypoglycemia   Essential hypertension   Type 2 diabetes mellitus with hypoglycemia without coma [facility])    Discharge Instructions   Allergies as of 05/08/2020      Reactions   Prednisone Other (See Comments)   Made glucose levels go EXTREMELY high      Medication List    STOP taking these medications   Dulaglutide 4.5 MG/0.5ML Sopn   insulin glargine 100 unit/mL Sopn Commonly known as: LANTUS   insulin lispro 100 UNIT/ML injection Commonly known as: HUMALOG   Jardiance 10 MG Tabs tablet Generic drug: empagliflozin   metFORMIN 500 MG tablet Commonly known as: GLUCOPHAGE   ONE TOUCH ULTRA TEST test strip Generic drug: glucose blood   OneTouch Delica Lancets 33G Misc   RA B-Complex Tabs     TAKE these medications   acetaminophen 500 MG tablet Commonly known as: TYLENOL Take by mouth. What changed: Another medication with the same name was removed. Continue taking this medication, and follow the directions you see here.   feeding supplement (ENSURE ENLIVE) Liqd Take 237 mLs by mouth daily.   insulin aspart 100 UNIT/ML injection Commonly known as: novoLOG Inject 0-9 Units into the skin every 4 (four) hours. For glucose 121 to 150 use one unit, for 151 to 200 use two units, for 201 to 250 use three units, for 251 to 300 use five units, for 301 to 350 use seven units, for 351 or greater use nine units.   insulin detemir 100 UNIT/ML injection Commonly known as: LEVEMIR Inject 0.05 mLs (5 Units total) into the skin at bedtime.   metoprolol tartrate 25 MG tablet Commonly known as: LOPRESSOR Take 25 mg by mouth 2 (two) times daily.   multivitamin with minerals Tabs tablet Take 1 tablet by mouth daily for 3 days. Start taking on: May 09, 2020   pantoprazole 40 MG tablet Commonly known as: PROTONIX Take 1 tablet (40 mg total) by mouth daily. Start taking on: May 09, 2020   traZODone 50  MG tablet Commonly known as: DESYREL Take 1 tablet (50 mg total) by mouth at bedtime.       Allergies  Allergen Reactions  . Prednisone Other (See Comments)    Made glucose levels go EXTREMELY high        Procedures/Studies: EEG  Result Date: 05/01/2020 Charlsie Quest, MD     05/01/2020  6:01 PM Patient Name: Charlene Parker MRN: [id number] Epilepsy Attending: Charlsie Quest Referring Physician/Provider: Dr Jaymes Graff Date: 05/01/2020 Duration: 24.50 mins Patient history: 69yo F with ams. EEG to evaluate for seizure. Level of alertness: Awake AEDs during EEG study: None Technical aspects: This EEG study was done with scalp electrodes positioned according to the 10-20 International system of electrode placement. Electrical activity was acquired at a sampling rate of 500Hz  and reviewed with a high frequency filter of 70Hz  and a low frequency filter of 1Hz . EEG data were recorded continuously and digitally stored. Description: No posterior dominant rhythm was seen. EEG showed continuous generalized 3 to 6 Hz theta-delta slowing.  Hyperventilation and photic stimulation were not performed.   ABNORMALITY -Continuous slow, generalized IMPRESSION: This study is suggestive of moderate diffuse encephalopathy, nonspecific etiology. No seizures or epileptiform discharges were seen throughout the recording.   DG Abd 1 View  Result Date: 05/01/2020 CLINICAL DATA:  NG tube placement EXAM: ABDOMEN - 1 VIEW COMPARISON:  Abdominal radiograph 04/28/2020, CTa chest 04/26/2020 FINDINGS: Transesophageal tube tip terminates in the left upper quadrant the region of the proximal stomach with the side port at the GE junction. Recommend advancing 3-5 cm for optimal positioning. Redemonstration of the heterogeneous bibasilar opacities. Cardiomediastinal contours are similar to prior. Surgical clips again seen in the upper abdomen. Air-filled appearance of the colon is nonspecific no high-grade  obstructive small bowel gas pattern is seen. Multilevel degenerative changes in the spine and SI joints. Mild levocurvature of the spine. IMPRESSION: 1. Transesophageal tube tip terminates in the left upper quadrant the region of the proximal stomach with the side port at the GE junction. Recommend advancing 3-5 cm for optimal positioning. 2. Redemonstration of heterogeneous bibasilar opacities compatible with a pneumonia in the setting of COVID-19. Electronically Signed   By: Kreg Shropshire M.D.   On: 05/01/2020 15:24   DG Abd 1 View  Result Date: 04/28/2020 CLINICAL DATA:  Enteric tube placement. EXAM: ABDOMEN - 1 VIEW COMPARISON:  CTA chest dated April 26, 2020. FINDINGS: Enteric tube in the distal stomach. The bowel gas pattern is normal. No radio-opaque calculi or other significant radiographic abnormality are seen. Patchy asymmetric opacities at both lung bases. No acute osseous abnormality. IMPRESSION: 1. Enteric tube in the distal stomach. 2. Bibasilar pneumonia. Electronically Signed   By: Obie Dredge M.D.   On: 04/28/2020 16:02   CT HEAD WO CONTRAST  Result Date: 04/28/2020 CLINICAL DATA:  Acute metabolic encephalopathy due to COVID-19 infection. EXAM: CT HEAD WITHOUT CONTRAST TECHNIQUE: Contiguous axial images were obtained from the base of the skull through the vertex without intravenous contrast. COMPARISON:  CT head 01/07/2012 FINDINGS: Brain: Motion degraded study.  Two temps were made due to motion. Ventricle size and cerebral volume normal for age. Mild patchy white matter hypodensity bilaterally appears chronic. These findings have progressed since 2013. Negative for acute infarct, hemorrhage, mass. Vascular: Negative for hyperdense vessel Skull: Negative Sinuses/Orbits: Paranasal sinuses clear.  Negative orbit. Other: None IMPRESSION: Motion degraded study No acute abnormality identified. Electronically Signed   By: Marlan Palau M.D.   On: 04/28/2020 11:08   CT ANGIO CHEST PE W  OR WO CONTRAST  Result Date: 04/26/2020 CLINICAL DATA:  Proxy Mia, elevated D-dimer, COVID-19 positive, on 15 L of O2, question pulmonary embolism EXAM: CT ANGIOGRAPHY CHEST WITH CONTRAST TECHNIQUE: Multidetector CT imaging of the chest was performed using the standard protocol during bolus administration of intravenous contrast. Multiplanar CT image reconstructions and MIPs were obtained to evaluate the vascular anatomy. Examination degraded by patient motion artifacts. CONTRAST:  OMNIPAQUE IOHEXOL 350 MG/ML SOLN IV COMPARISON:  None FINDINGS: Cardiovascular: Aorta normal caliber. No gross evidence of aneurysm or dissection. Heart unremarkable. No pericardial effusion. Examination is nondiagnostic for presence of pulmonary embolism due to motion artifacts. No large central pulmonary emboli are identified but significant portions of the pulmonary arterial tree bilaterally are unable to be adequately assessed. Mediastinum/Nodes: Esophagus unremarkable. Base of cervical region normal appearance. No thoracic adenopathy grossly identified. Lungs/Pleura: Patchy BILATERAL airspace infiltrates consistent with multifocal pneumonia and COVID-19. No gross pleural effusion, pneumothorax or mass within limitations of motion. Upper Abdomen: Mass at anterior aspect LEFT lobe liver 2.8 x 2.8 cm, low attenuation, question cyst though requiring confirmation by ultrasound. Streak artifacts from arms traverse liver and spleen. Gallbladder surgically absent. Musculoskeletal: No gross osseous findings. Review of the MIP images confirms the above findings. IMPRESSION: Nondiagnostic exam for presence of pulmonary embolism due to significant patient and respiratory  motion artifacts; no large central pulmonary emboli identified. Extensive BILATERAL pulmonary infiltrates consistent with multifocal pneumonia and COVID-19. 2.8 x 2.8 cm diameter lesion at anterior LEFT lobe liver, likely cyst, recommend non emergent ultrasound  characterization. Electronically Signed   By: Ulyses Southward M.D.   On: 04/26/2020 14:50   MR BRAIN WO CONTRAST  Result Date: 05/02/2020 CLINICAL DATA:  69 year old female with recent COVID-19. Metabolic encephalopathy. Persistent altered mental status. EXAM: MRI HEAD WITHOUT CONTRAST TECHNIQUE: Multiplanar, multiecho pulse sequences of the brain and surrounding structures were obtained without intravenous contrast. COMPARISON:  Head CT 04/28/2020 and earlier. FINDINGS: Study is intermittently degraded by motion artifact despite repeated imaging attempts. Brain: No restricted diffusion to suggest acute infarction. No midline shift, mass effect, evidence of mass lesion, ventriculomegaly, extra-axial collection or acute intracranial hemorrhage. Cervicomedullary junction and pituitary are within normal limits. Allowing for motion artifact gray and white matter signal appears largely normal for age throughout the brain. Mild nonspecific white matter signal changes are evident. But the deep gray nuclei, brainstem and cerebellum seem to remain normal. No cortical encephalomalacia or chronic cerebral blood products are identified. Vascular: Major intracranial vascular flow voids are preserved. Skull and upper cervical spine: Visualized bone marrow signal is within normal limits. Sinuses/Orbits: Negative. Other: Mastoids are clear. Grossly negative scalp and face soft tissues. IMPRESSION: Motion degraded exam with no acute intracranial abnormality identified. Grossly negative for age noncontrast MRI appearance of the brain. Electronically Signed   By: Odessa Fleming M.D.   On: 05/02/2020 19:39   DG Chest Port 1 View  Result Date: 04/19/2020 CLINICAL DATA:  Shortness of breath, COVID-19 positive. EXAM: PORTABLE CHEST 1 VIEW COMPARISON:  April 17, 2020. FINDINGS: Stable cardiomediastinal silhouette. Increased bilateral airspace opacities are noted consistent with multifocal pneumonia. No pneumothorax or pleural effusion is  noted. The visualized skeletal structures are unremarkable. IMPRESSION: Increased bilateral airspace opacities are noted consistent with multifocal pneumonia. Electronically Signed   By: Lupita Raider M.D.   On: 04/19/2020 12:56   DG Chest Portable 1 View  Result Date: 04/17/2020 CLINICAL DATA:  COVID-19, febrile, tachycardic EXAM: PORTABLE CHEST 1 VIEW COMPARISON:  Radiograph 11/11/2014 FINDINGS: Low lung volumes. Heterogenous bilateral airspace opacities more focally pronounced in the left lower lung with some associated air bronchograms. No pneumothorax or visible effusion. The cardiomediastinal contours are unremarkable. No acute osseous or soft tissue abnormality. Degenerative changes are present in the imaged spine and right shoulder. IMPRESSION: Low lung volumes with additional findings compatible with multifocal pneumonia in the setting of COVID-19 positivity. Electronically Signed   By: Kreg Shropshire M.D.   On: 04/17/2020 18:36   VAS [place] LOWER EXTREMITY VENOUS (DVT)  Result Date: 04/27/2020  Lower Venous DVTStudy Indications: Elevated Ddimer.  Risk Factors: COVID 19 positive. Limitations: Body habitus, poor ultrasound/tissue interface and patient constant movement, patient positioning, poor patient cooperation. Comparison Study: No prior studies. Performing Technologist: Chanda Busing RVT  Examination Guidelines: A complete evaluation includes B-mode imaging, spectral Doppler, color Doppler, and power Doppler as needed of all accessible portions of each vessel. Bilateral testing is considered an integral part of a complete examination. Limited examinations for reoccurring indications may be performed as noted. The reflux portion of the exam is performed with the patient in reverse Trendelenburg.  +---------+---------------+---------+-----------+----------+--------------+ RIGHT    CompressibilityPhasicitySpontaneityPropertiesThrombus Aging  +---------+---------------+---------+-----------+----------+--------------+ CFV      Full           Yes      Yes                                 +---------+---------------+---------+-----------+----------+--------------+  SFJ      Full                                                        +---------+---------------+---------+-----------+----------+--------------+ FV Prox  Full                                                        +---------+---------------+---------+-----------+----------+--------------+ FV Mid   Full                                                        +---------+---------------+---------+-----------+----------+--------------+ FV DistalFull                                                        +---------+---------------+---------+-----------+----------+--------------+ PFV      Full                                                        +---------+---------------+---------+-----------+----------+--------------+ POP      Full           Yes      Yes                                 +---------+---------------+---------+-----------+----------+--------------+ PTV      Full                                                        +---------+---------------+---------+-----------+----------+--------------+ PERO     Full                                                        +---------+---------------+---------+-----------+----------+--------------+   +---------+---------------+---------+-----------+----------+--------------+ LEFT     CompressibilityPhasicitySpontaneityPropertiesThrombus Aging +---------+---------------+---------+-----------+----------+--------------+ CFV      Full           Yes      Yes                                 +---------+---------------+---------+-----------+----------+--------------+ SFJ      Full                                                         +---------+---------------+---------+-----------+----------+--------------+  FV Prox  Full                                                        +---------+---------------+---------+-----------+----------+--------------+ FV Mid   Full                                                        +---------+---------------+---------+-----------+----------+--------------+ FV DistalFull                                                        +---------+---------------+---------+-----------+----------+--------------+ PFV      Full                                                        +---------+---------------+---------+-----------+----------+--------------+ POP      Full                                                        +---------+---------------+---------+-----------+----------+--------------+ PTV                                                   Not visualized +---------+---------------+---------+-----------+----------+--------------+ PERO                                                  Not visualized +---------+---------------+---------+-----------+----------+--------------+ Unable to obtain doppler images of the popliteal vein waveforms due to constant patient movement. Unable to obtain images of the posterior tibial and peroneal veins due to constant patient movement.    Summary: RIGHT: - There is no evidence of deep vein thrombosis in the lower extremity. However, portions of this examination were limited- see technologist comments above.  - No cystic structure found in the popliteal fossa.  LEFT: - There is no evidence of deep vein thrombosis in the lower extremity. However, portions of this examination were limited- see technologist comments above.  - No cystic structure found in the popliteal fossa.  *See table(s) above for measurements and observations. Electronically signed by Lemar Livings MD on 04/27/2020 at 1:01:45 PM.    Final          Subjective: Patient has been feeling better, no significant agitation. No apparent dyspnea or chest pain. Has been tolerating po well.   Discharge Exam: Vitals:   05/07/20 1957 05/08/20 0556  BP: (!) 144/72 138/70  Pulse: 92 72  Resp: 20 18  Temp: 98.5 F (36.9 C)  98 F (36.7 C)  SpO2: 94% 94%   Vitals:   05/06/20 2010 05/07/20 0651 05/07/20 1957 05/08/20 0556  BP: (!) 142/69 (!) 147/55 (!) 144/72 138/70  Pulse: 89 66 92 72  Resp: Temp: 98 F (36.7 C) 98.1 F (36.7 C) 98.5 F (36.9 C) 98 F (36.7 C)  TempSrc: Oral Oral    SpO2: 96% 100% 94% 94%  Weight:      Height:        General: Not in pain or dyspnea.  Neurology: Awake and alert, non focal.   E ENT: no pallor, no icterus, oral mucosa moist Cardiovascular: No JVD. S1-S2 present, rhythmic, no gallops, rubs, or murmurs. No lower extremity edema. Pulmonary: vesicular breath sounds bilaterally, adequate air movement, no wheezing, rhonchi or rales. Gastrointestinal. Abdomen soft and non tender Skin. No rashes Musculoskeletal: no joint deformities   The results of significant diagnostics from this hospitalization (including imaging, microbiology, ancillary and laboratory) are listed below for reference.     Microbiology: No results found for this or any previous visit (from the past 240 hour(s)).   Labs: BNP (last 3 results) Recent Labs    04/17/20 2053  BNP 15.5   Basic Metabolic Panel: Recent Labs  Lab 05/02/20 0526 05/06/20 0613 05/07/20 0756 05/08/20 0540  NA 139 141 145 140  K 4.9 3.8 3.7 3.6  CL 103 113* 112* 111  CO2 25 21* 22 20*  GLUCOSE 110* 171* 175* 149*  BUN 18 9 7* 9  CREATININE 0.76 0.66 0.78 0.78  CALCIUM 9.0 9.1 9.2 9.0  MG 2.5* 2.1  --   --   PHOS 3.6 2.3*  --   --    Liver Function Tests: Recent Labs  Lab 05/02/20 0526 05/06/20 0613 05/07/20 0756 05/08/20 0540  AST 14*  ALT ALKPHOS 47 43 46 51  BILITOT 1.8* 1.1 1.1 0.8   PROT 6.6 5.9* 5.7* 6.0*  ALBUMIN 2.8* 2.6* 2.6* 2.6*   No results for input(s): LIPASE, AMYLASE in the last 168 hours. Recent Labs  Lab 05/02/20 1230  AMMONIA 25   CBC: Recent Labs  Lab 05/02/20 0526 05/03/20 0355 05/06/20 0613  WBC 14.5* 10.2 7.5  NEUTROABS 11.9* 7.7 5.1  HGB 15.0 15.3* 14.2  HCT 46.4* 45.6 44.3  MCV 83.3 81.6 85.2  PLT 266 126* 169   Cardiac Enzymes: No results for input(s): CKTOTAL, CKMB, CKMBINDEX, TROPONINI in the last 168 hours. BNP: Invalid input(s): POCBNP CBG: Recent Labs  Lab 05/07/20 1619 05/07/20 1949 05/07/20 2349 05/08/20 0401 05/08/20 0749  GLUCAP 140* 221* 139* 131* 163*   D-Dimer No results for input(s): DDIMER in the last 72 hours. Hgb A1c No results for input(s): HGBA1C in the last 72 hours. Lipid Profile No results for input(s): CHOL, HDL, LDLCALC, TRIG, CHOLHDL, LDLDIRECT in the last 72 hours. Thyroid function studies No results for input(s): TSH, T4TOTAL, T3FREE, THYROIDAB in the last 72 hours.  Invalid input(s): FREET3 Anemia work up No results for input(s): VITAMINB12, FOLATE, FERRITIN, TIBC, IRON, RETICCTPCT in the last 72 hours. Urinalysis    Component Value Date/Time   COLORURINE YELLOW 04/22/2020 0300   APPEARANCEUR CLOUDY (A) 04/22/2020 0300   APPEARANCEUR Hazy 11/11/2014 1041   LABSPEC 1.032 (H) 04/22/2020 0300   LABSPEC 1.023 11/11/2014 1041   PHURINE 5.0 04/22/2020 0300   GLUCOSEU 50 (A) 04/22/2020 0300   GLUCOSEU >=500 11/11/2014 1041   HGBUR NEGATIVE 04/22/2020 0300  BILIRUBINUR NEGATIVE 04/22/2020 0300   BILIRUBINUR Negative 11/11/2014 1041   KETONESUR 5 (A) 04/22/2020 0300   PROTEINUR 30 (A) 04/22/2020 0300   NITRITE NEGATIVE 04/22/2020 0300   LEUKOCYTESUR NEGATIVE 04/22/2020 0300   LEUKOCYTESUR Negative 11/11/2014 1041   Sepsis Labs Invalid input(s): PROCALCITONIN,  WBC,  LACTICIDVEN Microbiology No results found for this or any previous visit (from the past 240 hour(s)).   Time  coordinating discharge: 45 minutes  SIGNED:   Coralie Keens, MD  [facility] 05/08/2020, 9:37 AM

## 2020-05-08 NOTE — Plan of Care (Signed)
  Problem: Education: Goal: Knowledge of risk factors and measures for prevention of condition will improve Outcome: Adequate for Discharge   Problem: Respiratory: Goal: Will maintain a patent airway Outcome: Adequate for Discharge   Problem: Activity: Goal: Risk for activity intolerance will decrease Outcome: Adequate for Discharge

## 2020-05-08 NOTE — Progress Notes (Signed)
Several attempts made to call report at [facility] #[phone] but no answer.

## 2020-06-21 ENCOUNTER — Emergency Department
Admission: EM | Admit: 2020-06-21 | Discharge: 2020-06-21 | Disposition: A | Discharge status: Home / Self Care | Payer: Managed Care, Other (non HMO) | Attending: Emergency Medicine | Admitting: Emergency Medicine

## 2020-06-21 ENCOUNTER — Encounter: Payer: Self-pay | Admitting: Emergency Medicine

## 2020-06-21 ENCOUNTER — Other Ambulatory Visit: Payer: Self-pay

## 2020-06-21 DIAGNOSIS — R059 Cough, unspecified: Secondary | ICD-10-CM | POA: Insufficient documentation

## 2020-06-21 DIAGNOSIS — E1165 Type 2 diabetes mellitus with hyperglycemia: Secondary | ICD-10-CM | POA: Insufficient documentation

## 2020-06-21 DIAGNOSIS — R1030 Lower abdominal pain, unspecified: Secondary | ICD-10-CM | POA: Diagnosis not present

## 2020-06-21 DIAGNOSIS — I10 Essential (primary) hypertension: Secondary | ICD-10-CM | POA: Diagnosis not present

## 2020-06-21 DIAGNOSIS — Z794 Long term (current) use of insulin: Secondary | ICD-10-CM | POA: Diagnosis not present

## 2020-06-21 DIAGNOSIS — R058 Other specified cough: Secondary | ICD-10-CM

## 2020-06-21 DIAGNOSIS — Z79899 Other long term (current) drug therapy: Secondary | ICD-10-CM | POA: Insufficient documentation

## 2020-06-21 DIAGNOSIS — R0602 Shortness of breath: Secondary | ICD-10-CM | POA: Diagnosis not present

## 2020-06-21 DIAGNOSIS — R109 Unspecified abdominal pain: Secondary | ICD-10-CM | POA: Diagnosis present

## 2020-06-21 DIAGNOSIS — Z8616 Personal history of COVID-19: Secondary | ICD-10-CM | POA: Diagnosis not present

## 2020-06-21 LAB — LIPASE, BLOOD: Lipase: 18 U/L (ref 11–51)

## 2020-06-21 LAB — CBC
HCT: 43.3 % (ref 36.0–46.0)
Hemoglobin: 14 g/dL (ref 12.0–15.0)
MCH: 27.4 pg (ref 26.0–34.0)
MCHC: 32.3 g/dL (ref 30.0–36.0)
MCV: 84.7 fL (ref 80.0–100.0)
Platelets: 276 10*3/uL (ref 150–400)
RBC: 5.11 MIL/uL (ref 3.87–5.11)
RDW: 14.9 % (ref 11.5–15.5)
WBC: 7.4 10*3/uL (ref 4.0–10.5)
nRBC: 0 % (ref 0.0–0.2)

## 2020-06-21 LAB — URINALYSIS, COMPLETE (UACMP) WITH MICROSCOPIC
Bacteria, UA: NONE SEEN
Bilirubin Urine: NEGATIVE
Glucose, UA: NEGATIVE mg/dL
Hgb urine dipstick: NEGATIVE
Ketones, ur: 5 mg/dL — AB
Leukocytes,Ua: NEGATIVE
Nitrite: NEGATIVE
Protein, ur: NEGATIVE mg/dL
Specific Gravity, Urine: 1.016 (ref 1.005–1.030)
pH: 5 (ref 5.0–8.0)

## 2020-06-21 LAB — COMPREHENSIVE METABOLIC PANEL
ALT: 17 U/L (ref 0–44)
AST: 25 U/L (ref 15–41)
Albumin: 3.6 g/dL (ref 3.5–5.0)
Alkaline Phosphatase: 65 U/L (ref 38–126)
Anion gap: 13 (ref 5–15)
BUN: 13 mg/dL (ref 8–23)
CO2: 18 mmol/L — ABNORMAL LOW (ref 22–32)
Calcium: 10.1 mg/dL (ref 8.9–10.3)
Chloride: 107 mmol/L (ref 98–111)
Creatinine, Ser: 0.76 mg/dL (ref 0.44–1.00)
GFR, Estimated: >60 mL/min (ref >60)
Glucose, Bld: 161 mg/dL — ABNORMAL HIGH (ref 70–99)
Potassium: 3.7 mmol/L (ref 3.5–5.1)
Sodium: 138 mmol/L (ref 135–145)
Total Bilirubin: 1 mg/dL (ref 0.3–1.2)
Total Protein: 7.3 g/dL (ref 6.5–8.1)

## 2020-06-21 LAB — CBG MONITORING, ED: Glucose-Capillary: 164 mg/dL — ABNORMAL HIGH (ref 70–99)

## 2020-06-21 MED ORDER — BENZONATATE 100 MG PO CAPS: 100.0000 mg | ORAL_CAPSULE | Freq: Four times a day (QID) | ORAL | 0 refills | Status: AC | PRN

## 2020-06-21 NOTE — ED Notes (Signed)
Family to stat desk asking for update. Family given update on wait time. Family verbalizes understanding.

## 2020-06-21 NOTE — ED Provider Notes (Signed)
[facility] Emergency Department Provider Note   ____________________________________________   First MD Initiated Contact with Patient 06/21/20 2037     (approximate)  I have reviewed the triage vital signs and the nursing notes.   HISTORY  Chief Complaint Abdominal Pain and Shortness of Breath    HPI Charlene Parker is a 69 y.o. female with a past medical history of type 2 diabetes, GERD, hypertension, and COVID-19 that was diagnosed 2 months prior to arrival who presents for persistent cough and an episode of significant shortness of breath and abdominal pain today.  Patient states that when she awoke she had a periumbilical, aching, 4/10 abdominal pain that resolved with her rubbing the skin overlying this pain.  Patient states that she has had recurrence of this pain proximately 2 times intermittently without any exacerbating or relieving factors.  Patient states that this pain lasted approximately 30 minutes on each episode and resolved with her rubbing.  Patient also complains of persistent dry cough since being diagnosed with Covid 2 months ago and being hospitalized with significant respiratory failure.  Patient has not tried any antitussive medications.  Patient denies any exacerbating or relieving factors for this cough.         Past Medical History:  Diagnosis Date  . Arthritis    knee  . COVID-19 04/10/2020  . Diabetes mellitus without complication (HCC)   . GERD (gastroesophageal reflux disease)   . Hypertension     Patient Active Problem List   Diagnosis Date Noted  . Acute hypoxemic respiratory failure due to COVID-19 (HCC) 04/19/2020  . Encephalopathy due to COVID-19 virus 04/19/2020  . Hypoglycemia 04/19/2020  . Essential hypertension 04/19/2020  . Type 2 diabetes mellitus with hypoglycemia without coma (HCC) 04/19/2020    Past Surgical History:  Procedure Laterality Date  . CARPAL TUNNEL RELEASE Right   . CESAREAN SECTION      . CHOLECYSTECTOMY    . CHONDROPLASTY Left 07/20/2017   Procedure: CHONDROPLASTY;  Surgeon: Signa Kell, MD;  Location: [facility];  Service: Orthopedics;  Laterality: Left;  Diabetic - insulin and oral meds  . GALLBLADDER SURGERY    . KNEE ARTHROSCOPY WITH MENISCAL REPAIR Left 07/20/2017   Procedure: KNEE ARTHROSCOPY WITH MENISCAL REPAIR;  Surgeon: Signa Kell, MD;  Location: [facility];  Service: Orthopedics;  Laterality: Left;  ADDUCTOR CANAL NERVE BLOCK  . KNEE SURGERY Right     Prior to Admission medications   Medication Sig Start Date End Date Taking? Authorizing Provider  acetaminophen (TYLENOL) 500 MG tablet Take by mouth.    [provider]  insulin aspart (NOVOLOG) 100 UNIT/ML injection Inject 0-9 Units into the skin every 4 (four) hours. For glucose 121 to 150 use one unit, for 151 to 200 use two units, for 201 to 250 use three units, for 251 to 300 use five units, for 301 to 350 use seven units, for 351 or greater use nine units. 05/08/20   Arrien, York Ram, MD  insulin detemir (LEVEMIR) 100 UNIT/ML injection Inject 0.05 mLs (5 Units total) into the skin at bedtime. 05/08/20 06/07/20  Arrien, York Ram, MD  metoprolol tartrate (LOPRESSOR) 25 MG tablet Take 25 mg by mouth 2 (two) times daily.    [provider]  pantoprazole (PROTONIX) 40 MG tablet Take 1 tablet (40 mg total) by mouth daily. 05/09/20 06/08/20  Arrien, York Ram, MD  traZODone (DESYREL) 50 MG tablet Take 1 tablet (50 mg total) by mouth at bedtime.  05/08/20 06/07/20  Arrien, York Ram, MD    Allergies Prednisone  Family History  Problem Relation Age of Onset  . Breast cancer Maternal Aunt        47s  . Diabetes Maternal Aunt   . Heart Problems Maternal Aunt   . Diabetes Mother   . Breast cancer Maternal Aunt 93  . Breast cancer Cousin        maternal  . Diabetes Sister   . Diabetes Brother   . Breast cancer Cousin        maternal    Social  History Social History   Tobacco Use  . Smoking status: Never Smoker  . Smokeless tobacco: Never Used  Vaping Use  . Vaping Use: Never used  Substance Use Topics  . Alcohol use: No    Alcohol/week: 0.0 standard drinks  . Drug use: No    Review of Systems Constitutional: No fever/chills Eyes: No visual changes. ENT: No sore throat. Cardiovascular: Denies chest pain. Respiratory: Endorses shortness of breath. Gastrointestinal: Endorses abdominal pain.  No nausea, no vomiting.  No diarrhea. Genitourinary: Negative for dysuria. Musculoskeletal: Negative for acute arthralgias Skin: Negative for rash. Neurological: Negative for headaches, weakness/numbness/paresthesias in any extremity Psychiatric: Negative for suicidal ideation/homicidal ideation   ____________________________________________   PHYSICAL EXAM:  VITAL SIGNS: ED Triage Vitals  Enc Vitals Group     BP 06/21/20 1803 126/82     Pulse Rate 06/21/20 1803 (!) 104     Resp 06/21/20 1803 (!) 23     Temp 06/21/20 1803 98.2 F (36.8 C)     Temp Source 06/21/20 1803 Oral     SpO2 06/21/20 1803 98 %     Weight 06/21/20 1805 200 lb (90.7 kg)     Height 06/21/20 1805 5\' 2"  (1.575 m)     Head Circumference --      Peak Flow --      Pain Score --      Pain Loc --      Pain Edu? --      Excl. in GC? --    Constitutional: Alert and oriented. Well appearing and in no acute distress. Eyes: Conjunctivae are normal. PERRL. Head: Atraumatic. Nose: No congestion/rhinnorhea. Mouth/Throat: Mucous membranes are moist. Neck: No stridor Cardiovascular: Grossly normal heart sounds.  Good peripheral circulation. Respiratory: Normal respiratory effort.  No retractions. Gastrointestinal: Soft and nontender. No distention. Musculoskeletal: No obvious deformities Neurologic:  Normal speech and language. No gross focal neurologic deficits are appreciated. Skin:  Skin is warm and dry. No rash noted. Psychiatric: Mood and affect  are normal. Speech and behavior are normal.  ____________________________________________   LABS (all labs ordered are listed, but only abnormal results are displayed)  Labs Reviewed  COMPREHENSIVE METABOLIC PANEL - Abnormal; Notable for the following components:      Result Value   CO2 18 (*)    Glucose, Bld 161 (*)    All other components within normal limits  URINALYSIS, COMPLETE (UACMP) WITH MICROSCOPIC - Abnormal; Notable for the following components:   Color, Urine YELLOW (*)    APPearance CLOUDY (*)    Ketones, ur 5 (*)    All other components within normal limits  CBG MONITORING, ED - Abnormal; Notable for the following components:   Glucose-Capillary 164 (*)    All other components within normal limits  LIPASE, BLOOD  CBC   ____________________________________________  EKG  ED ECG REPORT I, , the attending physician, personally viewed and interpreted  this ECG.  Date: 06/21/2020 EKG Time: 1806 Rate: 101 Rhythm: normal sinus rhythm QRS Axis: normal Intervals: normal ST/T Wave abnormalities: normal Narrative Interpretation: no evidence of acute ischemia   PROCEDURES  Procedure(s) performed (including Critical Care):  .1-3 Lead EKG Interpretation Performed by: Merwyn Katos, MD Authorized by: Merwyn Katos, MD     Interpretation: normal     ECG rate:  98   ECG rate assessment: normal     Rhythm: sinus rhythm     Ectopy: none     Conduction: normal       ____________________________________________   INITIAL IMPRESSION / ASSESSMENT AND PLAN / ED COURSE  As part of my medical decision making, I reviewed the following data within the electronic MEDICAL RECORD NUMBER Nursing notes reviewed and incorporated, Labs reviewed, EKG interpreted, Old chart reviewed, Radiograph reviewed and Notes from prior ED visits reviewed and incorporated        Patient is 69 year old female who presents for abdominal pain and persistent dry cough after  coronavirus infection Differential diagnosis includes bronchitis, colitis, mesenteric ischemia, PE Initial laboratory evaluation and EKG showed no evidence of acute abnormalities.  Patient was increasingly concerned with her wait time and did not want to wait for any further imaging studies to rule out possible PE or intra-abdominal infection.  Patient's vital signs are stable and without any evidence of acute leukocytosis or obvious sepsis.  I will discharge patient with the understanding that she needs to return for outpatient CT scans as soon as possible.  The patient has been reexamined and is ready to be discharged.  All diagnostic results have been reviewed and discussed with the patient/family.  Care plan has been outlined and the patient/family understands all current diagnoses, results, and treatment plans.  There are no new complaints, changes, or physical findings at this time.  All questions have been addressed and answered.  All medications, if any, that were given while in the emergency department or any that are being prescribed have been reviewed with the patient/family.  All side effects and adverse reactions have been explained.  Patient was instructed to, and agrees to follow-up with their primary care physician as well as return to the emergency department if any new or worsening symptoms develop.      ____________________________________________   FINAL CLINICAL IMPRESSION(S) / ED DIAGNOSES  Final diagnoses:  Lower abdominal pain  Shortness of breath  Nonproductive cough     ED Discharge Orders         Ordered    CT ANGIO CHEST PE W OR WO CONTRAST        06/21/20 2207    CT ABDOMEN PELVIS W CONTRAST        06/21/20 2207           Note:  This document was prepared using Dragon voice recognition software and may include unintentional dictation errors.   Merwyn Katos, MD 06/21/20 [phone]

## 2020-06-21 NOTE — ED Triage Notes (Signed)
Pt presents to ER from home reports Epigastric pain, reports was admitted to hospital due to [facility] and discharged from Rehab 2 weeks ago.  Pt presents tachypnea, anxious. Pt talks in complete sentences.

## 2020-07-20 ENCOUNTER — Telehealth: Payer: Self-pay

## 2020-07-20 NOTE — Telephone Encounter (Signed)
Called patient's home number to offer to schedule a Palliative Consult, no answer - left message with reason for call along with my name and contact number, requesting a return call to schedule visit.  I then called husband's cell number to schedule visit, no answer - also left messge requesting a return call.

## 2020-07-22 ENCOUNTER — Telehealth: Payer: Self-pay | Admitting: Adult Health Nurse Practitioner

## 2020-07-22 NOTE — Telephone Encounter (Signed)
Returned call to patient's husband regarding scheduling Palliative  Consult, husband states that patient is doing much better and is planning on returning to work.  I told him that we would cancel the referral and notify MD office and he was in agreement with this.

## 2020-07-24 ENCOUNTER — Other Ambulatory Visit: Payer: Self-pay | Admitting: Internal Medicine

## 2020-07-24 DIAGNOSIS — Z1231 Encounter for screening mammogram for malignant neoplasm of breast: Secondary | ICD-10-CM

## 2020-08-03 ENCOUNTER — Encounter: Payer: Self-pay | Admitting: Obstetrics and Gynecology

## 2020-08-03 ENCOUNTER — Ambulatory Visit (INDEPENDENT_AMBULATORY_CARE_PROVIDER_SITE_OTHER): Payer: Managed Care, Other (non HMO) | Admitting: Obstetrics and Gynecology

## 2020-08-03 ENCOUNTER — Other Ambulatory Visit: Payer: Self-pay

## 2020-08-03 VITALS — BP 120/80 | Ht 63.0 in | Wt 201.0 lb

## 2020-08-03 DIAGNOSIS — Z7185 Encounter for immunization safety counseling: Secondary | ICD-10-CM | POA: Diagnosis not present

## 2020-08-03 DIAGNOSIS — Z1231 Encounter for screening mammogram for malignant neoplasm of breast: Secondary | ICD-10-CM

## 2020-08-03 DIAGNOSIS — Z0000 Encounter for general adult medical examination without abnormal findings: Secondary | ICD-10-CM

## 2020-08-03 DIAGNOSIS — Z1239 Encounter for other screening for malignant neoplasm of breast: Secondary | ICD-10-CM | POA: Diagnosis not present

## 2020-08-03 DIAGNOSIS — Z01419 Encounter for gynecological examination (general) (routine) without abnormal findings: Secondary | ICD-10-CM | POA: Diagnosis not present

## 2020-08-03 NOTE — Progress Notes (Signed)
Gynecology Annual Exam  PCP: Jaclyn Shaggy, MD  Chief Complaint:  Chief Complaint  Patient presents with  . Gynecologic Exam    History of Present Illness:Patient is a 69 y.o. G2P2002 presents for annual exam. The patient has no complaints today. Patient reports exhaustion/fatigue as she continues to recover following covid infection and hospitalization. Reports difficulty with her memory and has neuro f/u planned.   LMP: No LMP recorded. Patient is postmenopausal.  The patient is not currently sexually active. She denies dyspareunia.  The patient does perform self breast exams.  There is notable family history of breast or ovarian cancer in her family.  The patient wears seatbelts: yes.   The patient has regular exercise: not asked.    The patient denies current symptoms of depression.     Review of Systems: Review of Systems  All other systems reviewed and are negative.   Past Medical History:  Patient Active Problem List   Diagnosis Date Noted  . Acute hypoxemic respiratory failure due to COVID-19 (HCC) 04/19/2020  . Encephalopathy due to COVID-19 virus 04/19/2020  . Hypoglycemia 04/19/2020  . Essential hypertension 04/19/2020  . Type 2 diabetes mellitus with hypoglycemia without coma (HCC) 04/19/2020    Past Surgical History:  Past Surgical History:  Procedure Laterality Date  . CARPAL TUNNEL RELEASE Right   . CESAREAN SECTION    . CHOLECYSTECTOMY    . CHONDROPLASTY Left 07/20/2017   Procedure: CHONDROPLASTY;  Surgeon: Signa Kell, MD;  Location: [facility];  Service: Orthopedics;  Laterality: Left;  Diabetic - insulin and oral meds  . GALLBLADDER SURGERY    . KNEE ARTHROSCOPY WITH MENISCAL REPAIR Left 07/20/2017   Procedure: KNEE ARTHROSCOPY WITH MENISCAL REPAIR;  Surgeon: Signa Kell, MD;  Location: [facility];  Service: Orthopedics;  Laterality: Left;  ADDUCTOR CANAL NERVE BLOCK  . KNEE SURGERY Right     Gynecologic History:  No LMP  recorded. Patient is postmenopausal. Last Pap: Results were:  Unknown  Last mammogram: 02/2019 Results were: BI-RAD I  Obstetric History: [id number]  Family History:  Family History  Problem Relation Age of Onset  . Breast cancer Maternal Aunt        23s  . Diabetes Maternal Aunt   . Heart Problems Maternal Aunt   . Diabetes Mother   . Breast cancer Maternal Aunt 93  . Breast cancer Cousin        maternal  . Diabetes Sister   . Diabetes Brother   . Breast cancer Cousin        maternal    Social History:  Social History   Socioeconomic History  . Marital status: Married    Spouse name: Not on file  . Number of children: Not on file  . Years of education: Not on file  . Highest education level: Not on file  Occupational History  . Not on file  Tobacco Use  . Smoking status: Never Smoker  . Smokeless tobacco: Never Used  Vaping Use  . Vaping Use: Never used  Substance and Sexual Activity  . Alcohol use: No    Alcohol/week: 0.0 standard drinks  . Drug use: No  . Sexual activity: Not Currently    Birth control/protection: Post-menopausal  Other Topics Concern  . Not on file  Social History Narrative  . Not on file   Social Determinants of Health   Financial Resource Strain: Not on file  Food Insecurity: Not on file  Transportation Needs: Not  on file  Physical Activity: Not on file  Stress: Not on file  Social Connections: Not on file  Intimate Partner Violence: Not on file    Allergies:  Allergies  Allergen Reactions  . Prednisone Other (See Comments)    Made glucose levels go EXTREMELY high    Medications: Prior to Admission medications   Medication Sig Start Date End Date Taking? Authorizing Provider  acetaminophen (TYLENOL) 500 MG tablet Take by mouth.   Yes [provider]  insulin aspart (NOVOLOG) 100 UNIT/ML injection Inject 0-9 Units into the skin every 4 (four) hours. For glucose 121 to 150 use one unit, for 151 to 200 use two units, for  201 to 250 use three units, for 251 to 300 use five units, for 301 to 350 use seven units, for 351 or greater use nine units. 05/08/20  Yes Arrien, York Ram, MD  insulin glargine (LANTUS SOLOSTAR) 100 UNIT/ML Solostar Pen Inject into the skin. 06/11/20  Yes [provider]  TRULICITY 4.5 MG/0.5ML SOPN  06/11/20  Yes [provider]  benzonatate (TESSALON PERLES) 100 MG capsule Take 1 capsule (100 mg total) by mouth every 6 (six) hours as needed for cough. Patient not taking: Reported on 08/03/2020 06/21/20 06/21/21  Merwyn Katos, MD  insulin detemir (LEVEMIR) 100 UNIT/ML injection Inject 0.05 mLs (5 Units total) into the skin at bedtime. 05/08/20 06/07/20  Arrien, York Ram, MD  metoprolol tartrate (LOPRESSOR) 25 MG tablet Take 25 mg by mouth 2 (two) times daily. Patient not taking: Reported on 08/03/2020    [provider]  pantoprazole (PROTONIX) 40 MG tablet Take 1 tablet (40 mg total) by mouth daily. 05/09/20 06/08/20  Arrien, York Ram, MD  traZODone (DESYREL) 50 MG tablet Take 1 tablet (50 mg total) by mouth at bedtime. 05/08/20 06/07/20  Arrien, York Ram, MD    Physical Exam Vitals: Blood pressure 120/80, height 5\' 3"  (1.6 m), weight 201 lb (91.2 kg).  General: NAD HEENT: normocephalic, anicteric Thyroid: no enlargement, no palpable nodules Pulmonary: No increased work of breathing, CTAB Cardiovascular: RRR, distal pulses 2+ Breast: Breast symmetrical, no tenderness, no palpable nodules or masses, no skin or nipple retraction present, no nipple discharge.  No axillary or supraclavicular lymphadenopathy. Abdomen: NABS, soft, non-tender, non-distended.  Umbilicus without lesions.  No hepatomegaly, splenomegaly or masses palpable. No evidence of hernia  Genitourinary:  External: Normal external female genitalia.  Normal urethral meatus, normal Bartholin's and Skene's glands.    Vagina: Normal vaginal mucosa, no evidence of prolapse.     Cervix: Grossly normal in appearance, no bleeding  Uterus: Non-enlarged, mobile, normal contour.  No CMT  Adnexa: ovaries non-enlarged, no adnexal masses  Rectal: deferred  Lymphatic: no evidence of inguinal lymphadenopathy Extremities: no edema, erythema, or tenderness Neurologic: Grossly intact Psychiatric: mood appropriate, affect full      Assessment: 69 y.o. G2P2002 routine annual exam  Plan: Problem List Items Addressed This Visit   None   Visit Diagnoses    Routine health maintenance    -  Primary   Relevant Orders   MM DIGITAL SCREENING BILATERAL   Encounter for gynecological examination without abnormal finding       Immunization counseling       Screening breast examination       Screening mammogram for breast cancer       Relevant Orders   MM DIGITAL SCREENING BILATERAL      1) Mammogram - recommend yearly screening mammogram.  Mammogram Was ordered  today - patient reports she will not be able to go until January due to time off  2) STI screening  was not offered and therefore not obtained  3) ASCCP guidelines and rational discussed.  Patient opts for discontinue age >51 screening interval  4) Osteoporosis  - per USPTF routine screening DEXA at age 64 - FRAX 10 year major fracture risk 9.1,  10 year hip fracture risk 1.4  Consider FDA-approved medical therapies in postmenopausal women and men aged 53 years and older, based on the following: a) A hip or vertebral (clinical or morphometric) fracture b) T-score ? -2.5 at the femoral neck or spine after appropriate evaluation to exclude secondary causes C) Low bone mass (T-score between -1.0 and -2.5 at the femoral neck or spine) and a 10-year probability of a hip fracture ? 3% or a 10-year probability of a major osteoporosis-related fracture ? 20% based on the [place]-adapted WHO algorithm   5) Routine healthcare maintenance including cholesterol, diabetes screening discussed managed by PCP  6) Colonoscopy >10  years ago - per patient.  Screening recommended starting at age 18 for average risk individuals, age 40 for individuals deemed at increased risk (including African Americans) and recommended to continue until age 38.  For patient age 60-85 individualized approach is recommended.  Gold standard screening is via colonoscopy, Cologuard screening is an acceptable alternative for patient unwilling or unable to undergo colonoscopy.  "Colorectal cancer screening for average?risk adults: 2018 guideline update from the American Cancer Society"[place]: A Cancer Journal for Clinicians: Jan 11, 2017   -Patient declines referral for colonoscopy today - considering if she desires procedure  7) Return in about 1 year (around 08/03/2021).    Zipporah Plants, CNM, MSN  Domingo Pulse, [facility] 08/03/2020, 9:43 AM

## 2020-08-11 ENCOUNTER — Other Ambulatory Visit: Payer: Self-pay

## 2020-08-11 ENCOUNTER — Ambulatory Visit
Admission: RE | Admit: 2020-08-11 | Discharge: 2020-08-11 | Disposition: A | Discharge status: Home / Self Care | Payer: Managed Care, Other (non HMO) | Source: Ambulatory Visit | Attending: Internal Medicine | Admitting: Internal Medicine

## 2020-08-11 DIAGNOSIS — Z1231 Encounter for screening mammogram for malignant neoplasm of breast: Secondary | ICD-10-CM

## 2020-08-18 ENCOUNTER — Other Ambulatory Visit: Payer: Self-pay | Admitting: Internal Medicine

## 2020-08-18 DIAGNOSIS — R921 Mammographic calcification found on diagnostic imaging of breast: Secondary | ICD-10-CM

## 2020-08-18 DIAGNOSIS — R928 Other abnormal and inconclusive findings on diagnostic imaging of breast: Secondary | ICD-10-CM

## 2020-08-20 ENCOUNTER — Ambulatory Visit
Admission: RE | Admit: 2020-08-20 | Discharge: 2020-08-20 | Disposition: A | Discharge status: Home / Self Care | Payer: Managed Care, Other (non HMO) | Source: Ambulatory Visit | Attending: Internal Medicine | Admitting: Internal Medicine

## 2020-08-20 ENCOUNTER — Other Ambulatory Visit: Payer: Self-pay

## 2020-08-20 DIAGNOSIS — R921 Mammographic calcification found on diagnostic imaging of breast: Secondary | ICD-10-CM

## 2020-08-20 DIAGNOSIS — R928 Other abnormal and inconclusive findings on diagnostic imaging of breast: Secondary | ICD-10-CM | POA: Diagnosis present

## 2020-08-21 ENCOUNTER — Other Ambulatory Visit: Payer: Self-pay | Admitting: Internal Medicine

## 2020-08-21 DIAGNOSIS — R928 Other abnormal and inconclusive findings on diagnostic imaging of breast: Secondary | ICD-10-CM

## 2020-08-21 DIAGNOSIS — R921 Mammographic calcification found on diagnostic imaging of breast: Secondary | ICD-10-CM

## 2020-09-18 ENCOUNTER — Encounter: Payer: Self-pay | Admitting: Ophthalmology

## 2020-09-18 ENCOUNTER — Other Ambulatory Visit: Payer: Self-pay

## 2020-09-25 ENCOUNTER — Other Ambulatory Visit: Payer: Self-pay

## 2020-09-25 ENCOUNTER — Other Ambulatory Visit
Admission: RE | Admit: 2020-09-25 | Discharge: 2020-09-25 | Disposition: A | Discharge status: Home / Self Care | Payer: Managed Care, Other (non HMO) | Source: Ambulatory Visit | Attending: Ophthalmology | Admitting: Ophthalmology

## 2020-09-25 DIAGNOSIS — Z01812 Encounter for preprocedural laboratory examination: Secondary | ICD-10-CM | POA: Insufficient documentation

## 2020-09-25 DIAGNOSIS — Z20822 Contact with and (suspected) exposure to covid-19: Secondary | ICD-10-CM | POA: Diagnosis not present

## 2020-09-26 LAB — SARS CORONAVIRUS 2 (TAT 6-24 HRS): SARS Coronavirus 2: NEGATIVE

## 2020-09-28 NOTE — Discharge Instructions (Signed)

## 2020-09-29 ENCOUNTER — Ambulatory Visit: Payer: Managed Care, Other (non HMO) | Admitting: Anesthesiology

## 2020-09-29 ENCOUNTER — Encounter: Payer: Self-pay | Admitting: Ophthalmology

## 2020-09-29 ENCOUNTER — Other Ambulatory Visit: Payer: Self-pay

## 2020-09-29 ENCOUNTER — Ambulatory Visit
Admission: RE | Admit: 2020-09-29 | Discharge: 2020-09-29 | Disposition: A | Discharge status: Home / Self Care | Payer: Managed Care, Other (non HMO) | Attending: Ophthalmology | Admitting: Ophthalmology

## 2020-09-29 ENCOUNTER — Encounter
Admission: RE | Disposition: A | Discharge status: Home / Self Care | Payer: Self-pay | Source: Home / Self Care | Attending: Ophthalmology

## 2020-09-29 DIAGNOSIS — E1136 Type 2 diabetes mellitus with diabetic cataract: Secondary | ICD-10-CM | POA: Insufficient documentation

## 2020-09-29 DIAGNOSIS — Z888 Allergy status to other drugs, medicaments and biological substances status: Secondary | ICD-10-CM | POA: Diagnosis not present

## 2020-09-29 DIAGNOSIS — Z8616 Personal history of COVID-19: Secondary | ICD-10-CM | POA: Insufficient documentation

## 2020-09-29 DIAGNOSIS — Z794 Long term (current) use of insulin: Secondary | ICD-10-CM | POA: Insufficient documentation

## 2020-09-29 DIAGNOSIS — H2511 Age-related nuclear cataract, right eye: Secondary | ICD-10-CM | POA: Insufficient documentation

## 2020-09-29 DIAGNOSIS — Z79899 Other long term (current) drug therapy: Secondary | ICD-10-CM | POA: Diagnosis not present

## 2020-09-29 HISTORY — PX: CATARACT EXTRACTION W/PHACO: SHX586

## 2020-09-29 LAB — GLUCOSE, CAPILLARY
Glucose-Capillary: 100 mg/dL — ABNORMAL HIGH (ref 70–99)
Glucose-Capillary: 103 mg/dL — ABNORMAL HIGH (ref 70–99)

## 2020-09-29 SURGERY — PHACOEMULSIFICATION, CATARACT, WITH IOL INSERTION
Anesthesia: Monitor Anesthesia Care | Site: Eye | Laterality: Right

## 2020-09-29 MED ORDER — ARMC OPHTHALMIC DILATING DROPS
1.0000 "application " | OPHTHALMIC | Status: DC | PRN
  Administered 2020-09-29 (×2): 1 via OPHTHALMIC

## 2020-09-29 MED ORDER — TETRACAINE HCL 0.5 % OP SOLN
1.0000 [drp] | OPHTHALMIC | Status: DC | PRN
  Administered 2020-09-29 (×4): 1 [drp] via OPHTHALMIC

## 2020-09-29 MED ORDER — LACTATED RINGERS IV SOLN: INTRAVENOUS | Status: DC

## 2020-09-29 MED ORDER — OXYCODONE HCL 5 MG PO TABS: 5.0000 mg | ORAL_TABLET | Freq: Once | ORAL | Status: DC | PRN

## 2020-09-29 MED ORDER — FENTANYL CITRATE (PF) 100 MCG/2ML IJ SOLN
INTRAMUSCULAR | Status: DC | PRN
  Administered 2020-09-29: 25 ug via INTRAVENOUS
  Administered 2020-09-29: 50 ug via INTRAVENOUS
  Administered 2020-09-29: 25 ug via INTRAVENOUS

## 2020-09-29 MED ORDER — OXYCODONE HCL 5 MG/5ML PO SOLN: 5.0000 mg | Freq: Once | ORAL | Status: DC | PRN

## 2020-09-29 MED ORDER — MOXIFLOXACIN HCL 0.5 % OP SOLN
OPHTHALMIC | Status: DC | PRN
  Administered 2020-09-29: 0.2 mL via OPHTHALMIC

## 2020-09-29 MED ORDER — BRIMONIDINE TARTRATE-TIMOLOL 0.2-0.5 % OP SOLN
OPHTHALMIC | Status: DC | PRN
  Administered 2020-09-29: 1 [drp] via OPHTHALMIC

## 2020-09-29 MED ORDER — EPINEPHRINE PF 1 MG/ML IJ SOLN
INTRAOCULAR | Status: DC | PRN
  Administered 2020-09-29: 48 mL via OPHTHALMIC

## 2020-09-29 MED ORDER — NA CHONDROIT SULF-NA HYALURON 40-17 MG/ML IO SOLN
INTRAOCULAR | Status: DC | PRN
  Administered 2020-09-29: 1 mL via INTRAOCULAR

## 2020-09-29 MED ORDER — LIDOCAINE HCL (PF) 2 % IJ SOLN
INTRAOCULAR | Status: DC | PRN
  Administered 2020-09-29: 2 mL

## 2020-09-29 SURGICAL SUPPLY — 19 items
CANNULA ANT/CHMB 27G (MISCELLANEOUS) ×2 IMPLANT
CANNULA ANT/CHMB 27GA (MISCELLANEOUS) ×4 IMPLANT
GLOVE SURG LX 8.0 MICRO (GLOVE) ×1
GLOVE SURG LX STRL 8.0 MICRO (GLOVE) ×1 IMPLANT
GLOVE SURG TRIUMPH 8.0 PF LTX (GLOVE) ×2 IMPLANT
GOWN STRL REUS W/ TWL LRG LVL3 (GOWN DISPOSABLE) ×2 IMPLANT
GOWN STRL REUS W/TWL LRG LVL3 (GOWN DISPOSABLE) ×4
LENS IOL TECNIS EYHANCE 22.5 (Intraocular Lens) ×1 IMPLANT
MARKER SKIN DUAL TIP RULER LAB (MISCELLANEOUS) ×2 IMPLANT
NDL FILTER BLUNT 18X1 1/2 (NEEDLE) ×1 IMPLANT
NEEDLE FILTER BLUNT 18X 1/2SAF (NEEDLE) ×1
NEEDLE FILTER BLUNT 18X1 1/2 (NEEDLE) ×1 IMPLANT
PACK EYE AFTER SURG (MISCELLANEOUS) ×2 IMPLANT
PACK OPTHALMIC (MISCELLANEOUS) ×2 IMPLANT
PACK PORFILIO (MISCELLANEOUS) ×2 IMPLANT
SYR 3ML LL SCALE MARK (SYRINGE) ×2 IMPLANT
SYR TB 1ML LUER SLIP (SYRINGE) ×2 IMPLANT
WATER STERILE IRR 250ML POUR (IV SOLUTION) ×2 IMPLANT
WIPE NON LINTING 3.25X3.25 (MISCELLANEOUS) ×2 IMPLANT

## 2020-09-29 NOTE — Anesthesia Postprocedure Evaluation (Signed)
Anesthesia Post Note  Patient: Charlene Parker  Procedure(s) Performed: CATARACT EXTRACTION PHACO AND INTRAOCULAR LENS PLACEMENT (IOC) RIGHT DIABETIC 4.05 00:33.4 (Right Eye)     Patient location during evaluation: PACU Anesthesia Type: MAC Level of consciousness: awake and alert Pain management: pain level controlled Vital Signs Assessment: post-procedure vital signs reviewed and stable Respiratory status: spontaneous breathing, nonlabored ventilation, respiratory function stable and patient connected to nasal cannula oxygen Cardiovascular status: stable and blood pressure returned to baseline Postop Assessment: no apparent nausea or vomiting Anesthetic complications: no   No complications documented.  Fidel Levy

## 2020-09-29 NOTE — Op Note (Signed)
PREOPERATIVE DIAGNOSIS:  Nuclear sclerotic cataract of the right eye.   POSTOPERATIVE DIAGNOSIS:  Cataract   OPERATIVE PROCEDURE:@   SURGEON:  Galen Manila, MD.   ANESTHESIA:  Anesthesiologist: Orrin Brigham, MD CRNA: Jinny Blossom, CRNA  1.      Managed anesthesia care. 2.      0.59ml of Shugarcaine was instilled in the eye following the paracentesis.   COMPLICATIONS:  None.   TECHNIQUE:   Stop and chop   DESCRIPTION OF PROCEDURE:  The patient was examined and consented in the preoperative holding area where the aforementioned topical anesthesia was applied to the right eye and then brought back to the Operating Room where the right eye was prepped and draped in the usual sterile ophthalmic fashion and a lid speculum was placed. A paracentesis was created with the side port blade and the anterior chamber was filled with viscoelastic. A near clear corneal incision was performed with the steel keratome. A continuous curvilinear capsulorrhexis was performed with a cystotome followed by the capsulorrhexis forceps. Hydrodissection and hydrodelineation were carried out with BSS on a blunt cannula. The lens was removed in a stop and chop  technique and the remaining cortical material was removed with the irrigation-aspiration handpiece. The capsular bag was inflated with viscoelastic and the Technis ZCB00  lens was placed in the capsular bag without complication. The remaining viscoelastic was removed from the eye with the irrigation-aspiration handpiece. The wounds were hydrated. The anterior chamber was flushed with BSS and the eye was inflated to physiologic pressure. 0.57ml of Vigamox was placed in the anterior chamber. The wounds were found to be water tight. The eye was dressed with Combigan. The patient was given protective glasses to wear throughout the day and a shield with which to sleep tonight. The patient was also given drops with which to begin a drop regimen today and will  follow-up with me in one day. Implant Name Type Inv. Item Serial No. Manufacturer Lot No. LRB No. Used Action  LENS IOL TECNIS EYHANCE 22.5 - [id number] Intraocular Lens LENS IOL TECNIS EYHANCE 22.5 [id number] JOHNSON   Right 1 Implanted   Procedure(s): CATARACT EXTRACTION PHACO AND INTRAOCULAR LENS PLACEMENT (IOC) RIGHT DIABETIC 4.05 00:33.4 (Right)  Electronically signed: Galen Manila 09/29/2020 8:19 AM

## 2020-09-29 NOTE — H&P (Signed)
[facility]   Primary Care Physician:  Jaclyn Shaggy, MD Ophthalmologist: Dr. Druscilla Brownie  Pre-Procedure History & Physical: HPI:  Charlene Parker is a 70 y.o. female here for cataract surgery.   Past Medical History:  Diagnosis Date  . Arthritis    knee  . COVID-19 04/10/2020  . Diabetes mellitus without complication (HCC)   . GERD (gastroesophageal reflux disease)   . Hypertension     Past Surgical History:  Procedure Laterality Date  . CARPAL TUNNEL RELEASE Right   . CESAREAN SECTION    . CHOLECYSTECTOMY    . CHONDROPLASTY Left 07/20/2017   Procedure: CHONDROPLASTY;  Surgeon: Signa Kell, MD;  Location: [facility];  Service: Orthopedics;  Laterality: Left;  Diabetic - insulin and oral meds  . GALLBLADDER SURGERY    . KNEE ARTHROSCOPY WITH MENISCAL REPAIR Left 07/20/2017   Procedure: KNEE ARTHROSCOPY WITH MENISCAL REPAIR;  Surgeon: Signa Kell, MD;  Location: [facility];  Service: Orthopedics;  Laterality: Left;  ADDUCTOR CANAL NERVE BLOCK  . KNEE SURGERY Right     Prior to Admission medications   Medication Sig Start Date End Date Taking? Authorizing Provider  acetaminophen (TYLENOL) 500 MG tablet Take by mouth.   Yes [provider]  insulin aspart (NOVOLOG) 100 UNIT/ML injection Inject 0-9 Units into the skin every 4 (four) hours. For glucose 121 to 150 use one unit, for 151 to 200 use two units, for 201 to 250 use three units, for 251 to 300 use five units, for 301 to 350 use seven units, for 351 or greater use nine units. 05/08/20  Yes Arrien, York Ram, MD  insulin glargine (LANTUS SOLOSTAR) 100 UNIT/ML Solostar Pen Inject 40 Units into the skin. 06/11/20  Yes [provider]  metoprolol tartrate (LOPRESSOR) 25 MG tablet Take 25 mg by mouth 2 (two) times daily.   Yes [provider]  TRULICITY 4.5 MG/0.5ML SOPN  06/11/20  Yes [provider]  zinc gluconate 50 MG tablet Take 50 mg by mouth daily.   Yes  [provider]  benzonatate (TESSALON PERLES) 100 MG capsule Take 1 capsule (100 mg total) by mouth every 6 (six) hours as needed for cough. Patient not taking: Reported on 08/03/2020 06/21/20 06/21/21  Merwyn Katos, MD  insulin detemir (LEVEMIR) 100 UNIT/ML injection Inject 0.05 mLs (5 Units total) into the skin at bedtime. Patient not taking: Reported on 09/18/2020 05/08/20 06/07/20  Arrien, York Ram, MD  pantoprazole (PROTONIX) 40 MG tablet Take 1 tablet (40 mg total) by mouth daily. Patient not taking: Reported on 09/18/2020 05/09/20 06/08/20  Arrien, York Ram, MD  traZODone (DESYREL) 50 MG tablet Take 1 tablet (50 mg total) by mouth at bedtime. Patient not taking: Reported on 09/18/2020 05/08/20 06/07/20  Arrien, York Ram, MD    Allergies as of 09/14/2020 - Review Complete 08/03/2020  Allergen Reaction Noted  . Prednisone Other (See Comments) 05/06/2015    Family History  Problem Relation Age of Onset  . Breast cancer Maternal Aunt        70s  . Diabetes Maternal Aunt   . Heart Problems Maternal Aunt   . Diabetes Mother   . Breast cancer Maternal Aunt 93  . Breast cancer Cousin        maternal  . Diabetes Sister   . Diabetes Brother   . Breast cancer Cousin        maternal    Social History   Socioeconomic History  .  Marital status: Married    Spouse name: Not on file  . Number of children: Not on file  . Years of education: Not on file  . Highest education level: Not on file  Occupational History  . Not on file  Tobacco Use  . Smoking status: Never Smoker  . Smokeless tobacco: Never Used  Vaping Use  . Vaping Use: Never used  Substance and Sexual Activity  . Alcohol use: No    Alcohol/week: 0.0 standard drinks  . Drug use: No  . Sexual activity: Not Currently    Birth control/protection: Post-menopausal  Other Topics Concern  . Not on file  Social History Narrative  . Not on file   Social Determinants of Health   Financial  Resource Strain: Not on file  Food Insecurity: Not on file  Transportation Needs: Not on file  Physical Activity: Not on file  Stress: Not on file  Social Connections: Not on file  Intimate Partner Violence: Not on file    Review of Systems: See HPI, otherwise negative ROS  Physical Exam: BP 139/75   Pulse 77   Temp (!) 97.3 F (36.3 C) (Temporal)   Resp 16   Ht 5\' 3"  (1.6 m)   Wt 95.7 kg   SpO2 100%   BMI 37.38 kg/m  General:   Alert,  pleasant and cooperative in NAD Head:  Normocephalic and atraumatic. Respiratory:  Normal work of breathing.  Impression/Plan: Charlene Parker is here for cataract surgery.  Risks, benefits, limitations, and alternatives regarding cataract surgery have been reviewed with the patient.  Questions have been answered.  All parties agreeable.   Dora Sims, MD  09/29/2020, 7:55 AM

## 2020-09-29 NOTE — Anesthesia Procedure Notes (Signed)
Procedure Name: MAC Date/Time: 09/29/2020 8:05 AM Performed by: Jeannene Patella, CRNA Pre-anesthesia Checklist: Patient identified, Emergency Drugs available, Suction available, Timeout performed and Patient being monitored Patient Re-evaluated:Patient Re-evaluated prior to induction Oxygen Delivery Method: Nasal cannula Placement Confirmation: positive ETCO2

## 2020-09-29 NOTE — Anesthesia Preprocedure Evaluation (Signed)
Anesthesia Evaluation  Patient identified by MRN, date of birth, ID band Patient awake    Reviewed: NPO status   History of Anesthesia Complications Negative for: history of anesthetic complications  Airway Mallampati: II  TM Distance: >3 FB Neck ROM: full    Dental no notable dental hx.    Pulmonary  COVID-19 04/10/2020 > hospitalized > rehab   Pulmonary exam normal        Cardiovascular Exercise Tolerance: Good hypertension, Normal cardiovascular exam     Neuro/Psych Memory decline negative psych ROS   GI/Hepatic negative GI ROS, Neg liver ROS, GERD  ,  Endo/Other  diabetesMorbid obesity (bmi 37)  Renal/GU negative Renal ROS  negative genitourinary   Musculoskeletal  (+) Arthritis ,   Abdominal   Peds  Hematology negative hematology ROS (+)   Anesthesia Other Findings ekg: 06/2020: Stach at 101;  COVID-19 04/10/2020 > hospitalized > rehab  Covid: NEG.  Reproductive/Obstetrics                             Anesthesia Physical Anesthesia Plan  ASA: III  Anesthesia Plan: MAC   Post-op Pain Management:    Induction:   PONV Risk Score and Plan: 2 and TIVA and Treatment may vary due to age or medical condition  Airway Management Planned:   Additional Equipment:   Intra-op Plan:   Post-operative Plan:   Informed Consent: I have reviewed the patients History and Physical, chart, labs and discussed the procedure including the risks, benefits and alternatives for the proposed anesthesia with the patient or authorized representative who has indicated his/her understanding and acceptance.       Plan Discussed with: CRNA  Anesthesia Plan Comments:         Anesthesia Quick Evaluation

## 2020-09-29 NOTE — Transfer of Care (Signed)
Immediate Anesthesia Transfer of Care Note  Patient: Charlene Parker  Procedure(s) Performed: CATARACT EXTRACTION PHACO AND INTRAOCULAR LENS PLACEMENT (IOC) RIGHT DIABETIC 4.05 00:33.4 (Right Eye)  Patient Location: PACU  Anesthesia Type: MAC  Level of Consciousness: awake, alert  and patient cooperative  Airway and Oxygen Therapy: Patient Spontanous Breathing and Patient connected to supplemental oxygen  Post-op Assessment: Post-op Vital signs reviewed, Patient's Cardiovascular Status Stable, Respiratory Function Stable, Patent Airway and No signs of Nausea or vomiting  Post-op Vital Signs: Reviewed and stable  Complications: No complications documented.

## 2020-09-30 ENCOUNTER — Encounter: Payer: Self-pay | Admitting: Ophthalmology

## 2020-10-12 ENCOUNTER — Encounter: Payer: Self-pay | Admitting: Ophthalmology

## 2020-10-12 ENCOUNTER — Other Ambulatory Visit: Payer: Self-pay

## 2020-10-16 ENCOUNTER — Other Ambulatory Visit
Admission: RE | Admit: 2020-10-16 | Discharge: 2020-10-16 | Disposition: A | Discharge status: Home / Self Care | Payer: Managed Care, Other (non HMO) | Source: Ambulatory Visit | Attending: Ophthalmology | Admitting: Ophthalmology

## 2020-10-16 ENCOUNTER — Other Ambulatory Visit: Payer: Self-pay

## 2020-10-16 DIAGNOSIS — Z20822 Contact with and (suspected) exposure to covid-19: Secondary | ICD-10-CM | POA: Diagnosis not present

## 2020-10-16 DIAGNOSIS — Z01812 Encounter for preprocedural laboratory examination: Secondary | ICD-10-CM | POA: Insufficient documentation

## 2020-10-16 NOTE — Discharge Instructions (Signed)

## 2020-10-17 LAB — SARS CORONAVIRUS 2 (TAT 6-24 HRS): SARS Coronavirus 2: NEGATIVE

## 2020-10-20 ENCOUNTER — Ambulatory Visit: Payer: Managed Care, Other (non HMO) | Admitting: Anesthesiology

## 2020-10-20 ENCOUNTER — Other Ambulatory Visit: Payer: Self-pay

## 2020-10-20 ENCOUNTER — Ambulatory Visit
Admission: RE | Admit: 2020-10-20 | Discharge: 2020-10-20 | Disposition: A | Discharge status: Home / Self Care | Payer: Managed Care, Other (non HMO) | Attending: Ophthalmology | Admitting: Ophthalmology

## 2020-10-20 ENCOUNTER — Encounter
Admission: RE | Disposition: A | Discharge status: Home / Self Care | Payer: Self-pay | Source: Home / Self Care | Attending: Ophthalmology

## 2020-10-20 ENCOUNTER — Encounter: Payer: Self-pay | Admitting: Ophthalmology

## 2020-10-20 DIAGNOSIS — Z8249 Family history of ischemic heart disease and other diseases of the circulatory system: Secondary | ICD-10-CM | POA: Diagnosis not present

## 2020-10-20 DIAGNOSIS — H2512 Age-related nuclear cataract, left eye: Secondary | ICD-10-CM | POA: Insufficient documentation

## 2020-10-20 DIAGNOSIS — Z961 Presence of intraocular lens: Secondary | ICD-10-CM | POA: Diagnosis not present

## 2020-10-20 DIAGNOSIS — Z8616 Personal history of COVID-19: Secondary | ICD-10-CM | POA: Insufficient documentation

## 2020-10-20 DIAGNOSIS — Z833 Family history of diabetes mellitus: Secondary | ICD-10-CM | POA: Insufficient documentation

## 2020-10-20 DIAGNOSIS — Z794 Long term (current) use of insulin: Secondary | ICD-10-CM | POA: Diagnosis not present

## 2020-10-20 DIAGNOSIS — Z9841 Cataract extraction status, right eye: Secondary | ICD-10-CM | POA: Diagnosis not present

## 2020-10-20 DIAGNOSIS — Z7952 Long term (current) use of systemic steroids: Secondary | ICD-10-CM | POA: Diagnosis not present

## 2020-10-20 DIAGNOSIS — Z888 Allergy status to other drugs, medicaments and biological substances status: Secondary | ICD-10-CM | POA: Diagnosis not present

## 2020-10-20 DIAGNOSIS — Z803 Family history of malignant neoplasm of breast: Secondary | ICD-10-CM | POA: Diagnosis not present

## 2020-10-20 DIAGNOSIS — E1136 Type 2 diabetes mellitus with diabetic cataract: Secondary | ICD-10-CM | POA: Diagnosis not present

## 2020-10-20 HISTORY — PX: CATARACT EXTRACTION W/PHACO: SHX586

## 2020-10-20 LAB — GLUCOSE, CAPILLARY
Glucose-Capillary: 79 mg/dL (ref 70–99)
Glucose-Capillary: 97 mg/dL (ref 70–99)

## 2020-10-20 SURGERY — PHACOEMULSIFICATION, CATARACT, WITH IOL INSERTION
Anesthesia: Monitor Anesthesia Care | Site: Eye | Laterality: Left

## 2020-10-20 MED ORDER — NA CHONDROIT SULF-NA HYALURON 40-17 MG/ML IO SOLN
INTRAOCULAR | Status: DC | PRN
  Administered 2020-10-20: 1 mL via INTRAOCULAR

## 2020-10-20 MED ORDER — BRIMONIDINE TARTRATE-TIMOLOL 0.2-0.5 % OP SOLN
OPHTHALMIC | Status: DC | PRN
  Administered 2020-10-20: 1 [drp] via OPHTHALMIC

## 2020-10-20 MED ORDER — EPINEPHRINE PF 1 MG/ML IJ SOLN
INTRAOCULAR | Status: DC | PRN
  Administered 2020-10-20: 41 mL via OPHTHALMIC

## 2020-10-20 MED ORDER — PHENYLEPHRINE HCL 10 % OP SOLN
1.0000 [drp] | OPHTHALMIC | Status: DC | PRN
  Administered 2020-10-20 (×3): 1 [drp] via OPHTHALMIC

## 2020-10-20 MED ORDER — DEXTROSE 50 % IV SOLN
12.5000 g | Freq: Once | INTRAVENOUS | Status: AC
  Administered 2020-10-20: 12.5 g via INTRAVENOUS

## 2020-10-20 MED ORDER — FENTANYL CITRATE (PF) 100 MCG/2ML IJ SOLN
INTRAMUSCULAR | Status: DC | PRN
  Administered 2020-10-20: 50 ug via INTRAVENOUS

## 2020-10-20 MED ORDER — LIDOCAINE HCL (PF) 2 % IJ SOLN
INTRAOCULAR | Status: DC | PRN
  Administered 2020-10-20: 1 mL

## 2020-10-20 MED ORDER — CYCLOPENTOLATE HCL 2 % OP SOLN
1.0000 [drp] | OPHTHALMIC | Status: DC | PRN
  Administered 2020-10-20 (×3): 1 [drp] via OPHTHALMIC

## 2020-10-20 MED ORDER — MIDAZOLAM HCL 2 MG/2ML IJ SOLN
INTRAMUSCULAR | Status: DC | PRN
  Administered 2020-10-20: 1 mg via INTRAVENOUS

## 2020-10-20 MED ORDER — MOXIFLOXACIN HCL 0.5 % OP SOLN
OPHTHALMIC | Status: DC | PRN
  Administered 2020-10-20: 0.2 mL via OPHTHALMIC

## 2020-10-20 MED ORDER — TETRACAINE HCL 0.5 % OP SOLN
1.0000 [drp] | OPHTHALMIC | Status: DC | PRN
  Administered 2020-10-20 (×3): 1 [drp] via OPHTHALMIC

## 2020-10-20 SURGICAL SUPPLY — 19 items
CANNULA ANT/CHMB 27G (MISCELLANEOUS) ×2 IMPLANT
CANNULA ANT/CHMB 27GA (MISCELLANEOUS) ×4 IMPLANT
GLOVE SURG LX 8.0 MICRO (GLOVE) ×1
GLOVE SURG LX STRL 8.0 MICRO (GLOVE) ×1 IMPLANT
GLOVE SURG TRIUMPH 8.0 PF LTX (GLOVE) ×2 IMPLANT
GOWN STRL REUS W/ TWL LRG LVL3 (GOWN DISPOSABLE) ×2 IMPLANT
GOWN STRL REUS W/TWL LRG LVL3 (GOWN DISPOSABLE) ×4
LENS IOL TECNIS EYHANCE 22.5 (Intraocular Lens) ×1 IMPLANT
MARKER SKIN DUAL TIP RULER LAB (MISCELLANEOUS) ×2 IMPLANT
NDL FILTER BLUNT 18X1 1/2 (NEEDLE) ×1 IMPLANT
NEEDLE FILTER BLUNT 18X 1/2SAF (NEEDLE) ×1
NEEDLE FILTER BLUNT 18X1 1/2 (NEEDLE) ×1 IMPLANT
PACK EYE AFTER SURG (MISCELLANEOUS) ×2 IMPLANT
PACK OPTHALMIC (MISCELLANEOUS) ×2 IMPLANT
PACK PORFILIO (MISCELLANEOUS) ×2 IMPLANT
SYR 3ML LL SCALE MARK (SYRINGE) ×2 IMPLANT
SYR TB 1ML LUER SLIP (SYRINGE) ×2 IMPLANT
WATER STERILE IRR 250ML POUR (IV SOLUTION) ×2 IMPLANT
WIPE NON LINTING 3.25X3.25 (MISCELLANEOUS) ×2 IMPLANT

## 2020-10-20 NOTE — Op Note (Signed)
PREOPERATIVE DIAGNOSIS:  Nuclear sclerotic cataract of the left eye.   POSTOPERATIVE DIAGNOSIS:  Nuclear sclerotic cataract of the left eye.   OPERATIVE PROCEDURE:@   SURGEON:  Galen Manila, MD.   ANESTHESIA:  Anesthesiologist: Page, Wille Celeste, MD CRNA: Maree Krabbe, CRNA  1.      Managed anesthesia care. 2.     0.69ml of Shugarcaine was instilled following the paracentesis   COMPLICATIONS:  None.   TECHNIQUE:   Stop and chop   DESCRIPTION OF PROCEDURE:  The patient was examined and consented in the preoperative holding area where the aforementioned topical anesthesia was applied to the left eye and then brought back to the Operating Room where the left eye was prepped and draped in the usual sterile ophthalmic fashion and a lid speculum was placed. A paracentesis was created with the side port blade and the anterior chamber was filled with viscoelastic. A near clear corneal incision was performed with the steel keratome. A continuous curvilinear capsulorrhexis was performed with a cystotome followed by the capsulorrhexis forceps. Hydrodissection and hydrodelineation were carried out with BSS on a blunt cannula. The lens was removed in a stop and chop  technique and the remaining cortical material was removed with the irrigation-aspiration handpiece. The capsular bag was inflated with viscoelastic and the Technis [id number] lens was placed in the capsular bag without complication. The remaining viscoelastic was removed from the eye with the irrigation-aspiration handpiece. The wounds were hydrated. The anterior chamber was flushed with BSS and the eye was inflated to physiologic pressure. 0.65ml Vigamox was placed in the anterior chamber. The wounds were found to be water tight. The eye was dressed with Combigan. The patient was given protective glasses to wear throughout the day and a shield with which to sleep tonight. The patient was also given drops with which to begin a drop regimen today and  will follow-up with me in one day. Implant Name Type Inv. Item Serial No. Manufacturer Lot No. LRB No. Used Action  LENS IOL TECNIS EYHANCE 22.5 - [id number] Intraocular Lens LENS IOL TECNIS EYHANCE 22.5 [id number] JOHNSON   Left 1 Implanted    Procedure(s) with comments: CATARACT EXTRACTION PHACO AND INTRAOCULAR LENS PLACEMENT (IOC) LEFT DIABETIC 5.42 00:30.0 (Left) - Diabetic  Electronically signed: Galen Manila 10/20/2020 10:43 AM

## 2020-10-20 NOTE — Anesthesia Procedure Notes (Signed)
Procedure Name: MAC Date/Time: 10/20/2020 10:31 AM Performed by: Cameron Ali, CRNA Pre-anesthesia Checklist: Patient identified, Emergency Drugs available, Suction available, Timeout performed and Patient being monitored Patient Re-evaluated:Patient Re-evaluated prior to induction Oxygen Delivery Method: Nasal cannula Placement Confirmation: positive ETCO2

## 2020-10-20 NOTE — Transfer of Care (Signed)
Immediate Anesthesia Transfer of Care Note  Patient: Charlene Parker  Procedure(s) Performed: CATARACT EXTRACTION PHACO AND INTRAOCULAR LENS PLACEMENT (IOC) LEFT DIABETIC 5.42 00:30.0 (Left Eye)  Patient Location: PACU  Anesthesia Type: MAC  Level of Consciousness: awake, alert  and patient cooperative  Airway and Oxygen Therapy: Patient Spontanous Breathing and Patient connected to supplemental oxygen  Post-op Assessment: Post-op Vital signs reviewed, Patient's Cardiovascular Status Stable, Respiratory Function Stable, Patent Airway and No signs of Nausea or vomiting  Post-op Vital Signs: Reviewed and stable  Complications: No complications documented.

## 2020-10-20 NOTE — Anesthesia Preprocedure Evaluation (Addendum)
Anesthesia Evaluation  Patient identified by MRN, date of birth, ID band Patient awake    History of Anesthesia Complications Negative for: history of anesthetic complications  Airway Mallampati: IV  TM Distance: >3 FB Neck ROM: Full    Dental no notable dental hx.    Pulmonary neg pulmonary ROS,    Pulmonary exam normal        Cardiovascular hypertension, Pt. on medications and Pt. on home beta blockers Normal cardiovascular exam     Neuro/Psych    GI/Hepatic negative GI ROS, Neg liver ROS,   Endo/Other  diabetes, Type 2, Insulin DependentMorbid obesity (BMI 38)  Renal/GU negative Renal ROS     Musculoskeletal   Abdominal   Peds  Hematology negative hematology ROS (+)   Anesthesia Other Findings   Reproductive/Obstetrics                            Anesthesia Physical Anesthesia Plan  ASA: II  Anesthesia Plan: MAC   Post-op Pain Management:    Induction: Intravenous  PONV Risk Score and Plan: 2 and Midazolam, TIVA and Treatment may vary due to age or medical condition  Airway Management Planned: Nasal Cannula and Natural Airway  Additional Equipment: None  Intra-op Plan:   Post-operative Plan:   Informed Consent: I have reviewed the patients History and Physical, chart, labs and discussed the procedure including the risks, benefits and alternatives for the proposed anesthesia with the patient or authorized representative who has indicated his/her understanding and acceptance.       Plan Discussed with: CRNA  Anesthesia Plan Comments:         Anesthesia Quick Evaluation

## 2020-10-20 NOTE — Anesthesia Postprocedure Evaluation (Signed)
Anesthesia Post Note  Patient: Charlene Parker  Procedure(s) Performed: CATARACT EXTRACTION PHACO AND INTRAOCULAR LENS PLACEMENT (IOC) LEFT DIABETIC 5.42 00:30.0 (Left Eye)     Patient location during evaluation: PACU Anesthesia Type: MAC Level of consciousness: awake and alert Pain management: pain level controlled Vital Signs Assessment: post-procedure vital signs reviewed and stable Respiratory status: spontaneous breathing Cardiovascular status: blood pressure returned to baseline Postop Assessment: no apparent nausea or vomiting, adequate PO intake and no headache Anesthetic complications: no   No complications documented.  Charlene Parker

## 2020-10-20 NOTE — H&P (Signed)
Charlene Parker   Primary Care Physician:  Charlene Shaggy, MD Ophthalmologist: Dr. Druscilla Parker  Pre-Procedure History & Physical: HPI:  Charlene Parker is a 69 y.o. female here for cataract surgery.   Past Medical History:  Diagnosis Date  . Arthritis    knee  . COVID-19 04/10/2020  . Diabetes mellitus without complication (HCC)   . GERD (gastroesophageal reflux disease)   . Hypertension     Past Surgical History:  Procedure Laterality Date  . CARPAL TUNNEL RELEASE Right   . CATARACT EXTRACTION W/PHACO Right 09/29/2020   Procedure: CATARACT EXTRACTION PHACO AND INTRAOCULAR LENS PLACEMENT (IOC) RIGHT DIABETIC 4.05 00:33.4;  Surgeon: Charlene Manila, MD;  Location: [facility];  Service: Ophthalmology;  Laterality: Right;  . CESAREAN SECTION    . CHOLECYSTECTOMY    . CHONDROPLASTY Left 07/20/2017   Procedure: CHONDROPLASTY;  Surgeon: Charlene Kell, MD;  Location: [facility];  Service: Orthopedics;  Laterality: Left;  Diabetic - insulin and oral meds  . GALLBLADDER SURGERY    . KNEE ARTHROSCOPY WITH MENISCAL REPAIR Left 07/20/2017   Procedure: KNEE ARTHROSCOPY WITH MENISCAL REPAIR;  Surgeon: Charlene Kell, MD;  Location: [facility];  Service: Orthopedics;  Laterality: Left;  ADDUCTOR CANAL NERVE BLOCK  . KNEE SURGERY Right     Prior to Admission medications   Medication Sig Start Date End Date Taking? Authorizing Provider  acetaminophen (TYLENOL) 500 MG tablet Take by mouth.   Yes [provider]  insulin aspart (NOVOLOG) 100 UNIT/ML injection Inject 0-9 Units into the skin every 4 (four) hours. For glucose 121 to 150 use one unit, for 151 to 200 use two units, for 201 to 250 use three units, for 251 to 300 use five units, for 301 to 350 use seven units, for 351 or greater use nine units. 05/08/20  Yes Charlene Parker, Charlene Ram, MD  insulin glargine (LANTUS SOLOSTAR) 100 UNIT/ML Solostar Pen Inject 40 Units into the skin. 06/11/20  Yes [provider]  metoprolol tartrate (LOPRESSOR) 25 MG tablet Take 25 mg by mouth 2 (two) times daily.   Yes [provider]  TRULICITY 4.5 MG/0.5ML SOPN  06/11/20  Yes [provider]  zinc gluconate 50 MG tablet Take 50 mg by mouth daily.   Yes [provider]  benzonatate (TESSALON PERLES) 100 MG capsule Take 1 capsule (100 mg total) by mouth every 6 (six) hours as needed for cough. Patient not taking: Reported on 08/03/2020 06/21/20 06/21/21  Charlene Katos, MD  insulin detemir (LEVEMIR) 100 UNIT/ML injection Inject 0.05 mLs (5 Units total) into the skin at bedtime. Patient not taking: Reported on 09/18/2020 05/08/20 06/07/20  Charlene Parker, Charlene Ram, MD  pantoprazole (PROTONIX) 40 MG tablet Take 1 tablet (40 mg total) by mouth daily. Patient not taking: Reported on 09/18/2020 05/09/20 06/08/20  Charlene Parker, Charlene Ram, MD  traZODone (DESYREL) 50 MG tablet Take 1 tablet (50 mg total) by mouth at bedtime. Patient not taking: Reported on 09/18/2020 05/08/20 06/07/20  Charlene Parker, Charlene Ram, MD    Allergies as of 09/14/2020 - Review Complete 08/03/2020  Allergen Reaction Noted  . Prednisone Other (See Comments) 05/06/2015    Family History  Problem Relation Age of Onset  . Breast cancer Maternal Aunt        84s  . Diabetes Maternal Aunt   . Heart Problems Maternal Aunt   . Diabetes Mother   . Breast cancer Maternal Aunt 93  . Breast cancer Cousin  maternal  . Diabetes Sister   . Diabetes Brother   . Breast cancer Cousin        maternal    Social History   Socioeconomic History  . Marital status: Married    Spouse name: Not on file  . Number of children: Not on file  . Years of education: Not on file  . Highest education level: Not on file  Occupational History  . Not on file  Tobacco Use  . Smoking status: Never Smoker  . Smokeless tobacco: Never Used  Vaping Use  . Vaping Use: Never used  Substance and Sexual Activity  . Alcohol use: No     Alcohol/week: 0.0 standard drinks  . Drug use: No  . Sexual activity: Not Currently    Birth control/protection: Post-menopausal  Other Topics Concern  . Not on file  Social History Narrative  . Not on file   Social Determinants of Health   Financial Resource Strain: Not on file  Food Insecurity: Not on file  Transportation Needs: Not on file  Physical Activity: Not on file  Stress: Not on file  Social Connections: Not on file  Intimate Partner Violence: Not on file    Review of Systems: See HPI, otherwise negative ROS  Physical Exam: BP (!) 151/79   Pulse 70   Temp (!) 97 F (36.1 C) (Temporal)   Resp 16   Ht 5\' 3"  (1.6 m)   Wt 97.1 kg   SpO2 100%   BMI 37.91 kg/m  General:   Alert,  pleasant and cooperative in NAD Head:  Normocephalic and atraumatic. Respiratory:  Normal work of breathing.  Impression/Plan: Lodge is here for cataract surgery.  Risks, benefits, limitations, and alternatives regarding cataract surgery have been reviewed with the patient.  Questions have been answered.  All parties agreeable.   Charlene Sims, MD  10/20/2020, 10:22 AM

## 2020-10-21 ENCOUNTER — Encounter: Payer: Self-pay | Admitting: Ophthalmology

## 2021-03-23 ENCOUNTER — Ambulatory Visit
Admission: RE | Admit: 2021-03-23 | Discharge: 2021-03-23 | Disposition: A | Discharge status: Home / Self Care | Payer: Medicare Other | Source: Ambulatory Visit | Attending: Internal Medicine | Admitting: Internal Medicine

## 2021-03-23 ENCOUNTER — Other Ambulatory Visit: Payer: Self-pay

## 2021-03-23 DIAGNOSIS — R928 Other abnormal and inconclusive findings on diagnostic imaging of breast: Secondary | ICD-10-CM | POA: Insufficient documentation

## 2021-03-23 DIAGNOSIS — R921 Mammographic calcification found on diagnostic imaging of breast: Secondary | ICD-10-CM | POA: Diagnosis present

## 2021-09-07 IMAGING — DX DG CHEST 1V PORT
1 series · 1 of 1 positions shown · non-contrast
Comparison: Radiograph 11/11/2014

CLINICAL DATA: IZKRX-64, febrile, tachycardic

EXAM:
PORTABLE CHEST 1 VIEW

[chest ap]
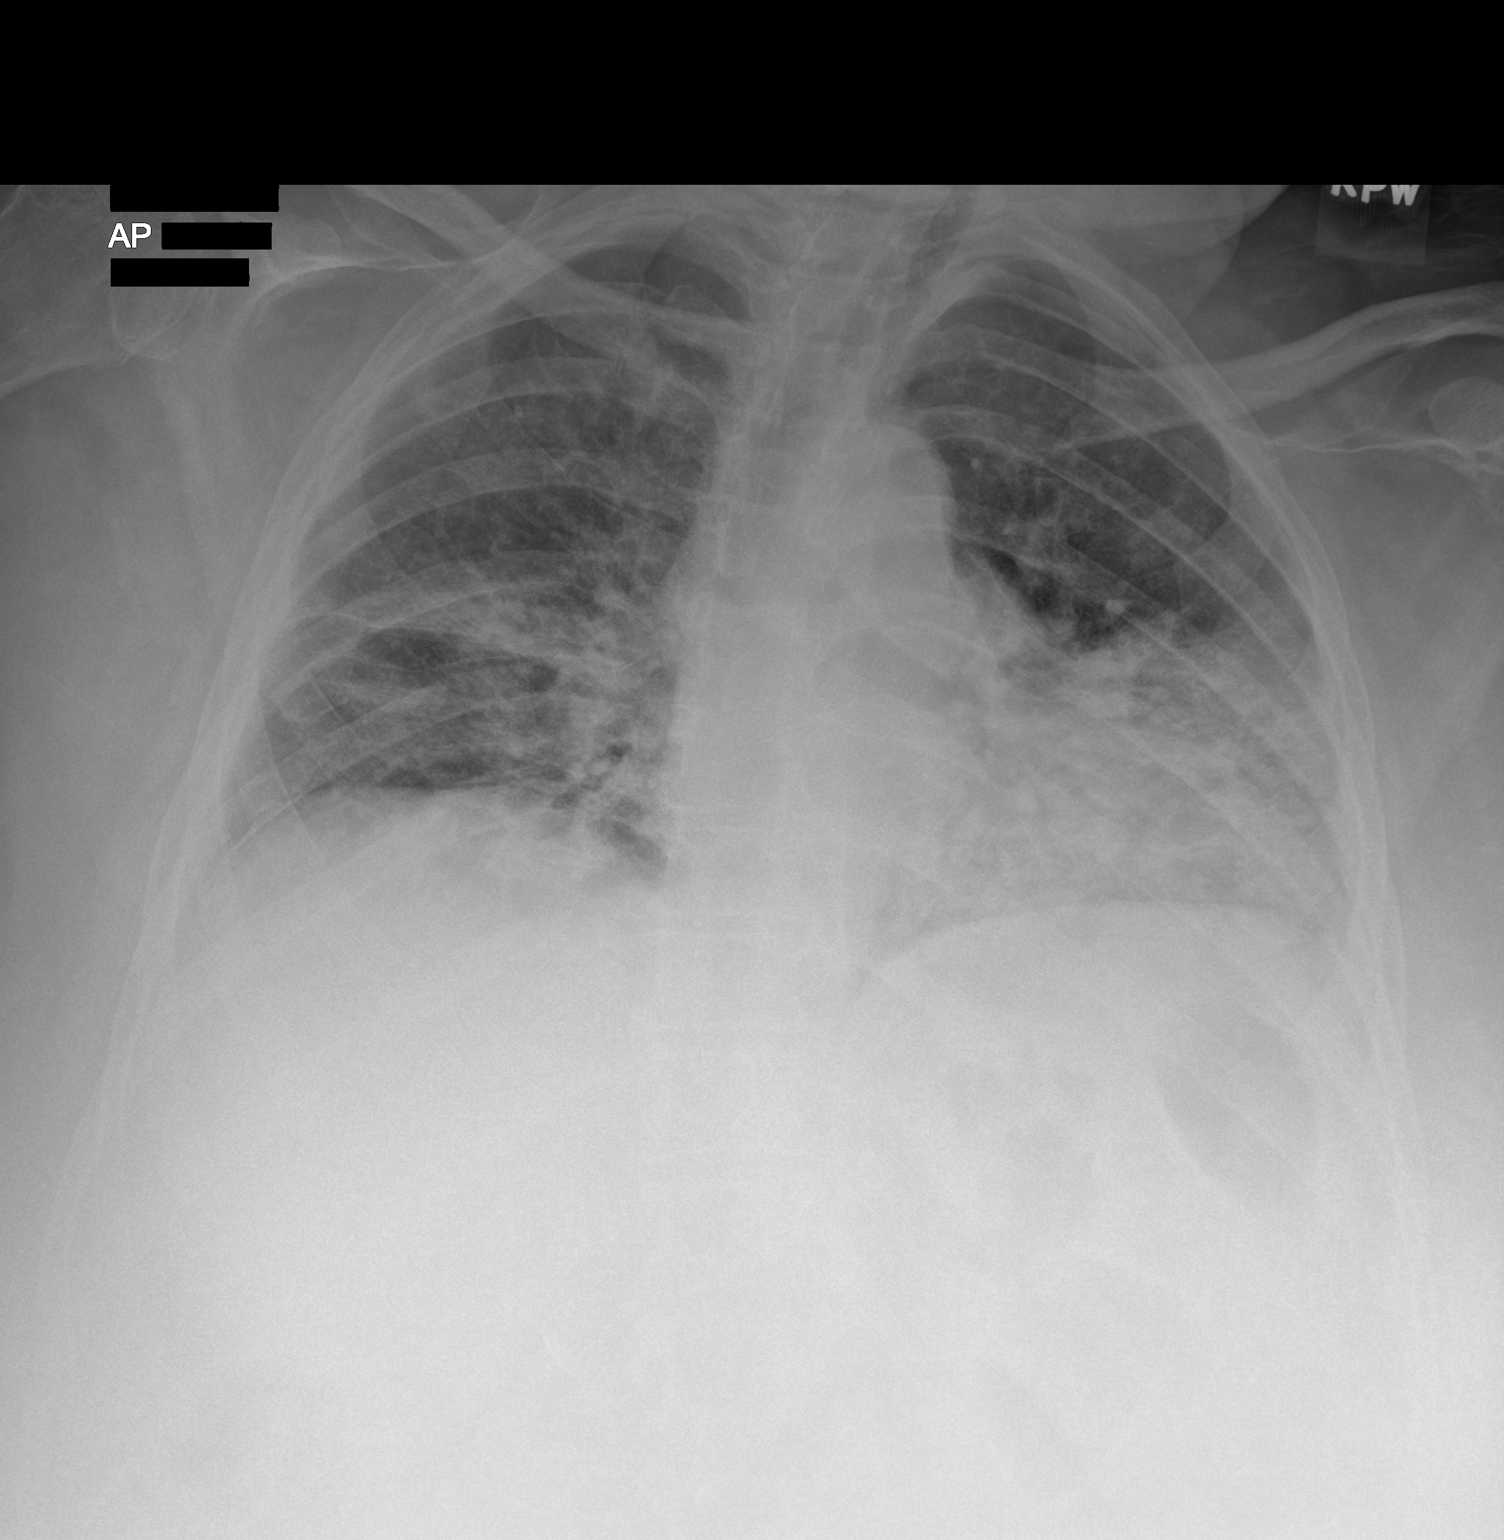

[1 of 1 positions shown; findings below may reference images not displayed]

FINDINGS: Low lung volumes. Heterogenous bilateral airspace opacities more
focally pronounced in the left lower lung with some associated air
bronchograms. No pneumothorax or visible effusion. The
cardiomediastinal contours are unremarkable. No acute osseous or
soft tissue abnormality. Degenerative changes are present in the
imaged spine and right shoulder.
IMPRESSION: Low lung volumes with additional findings compatible with multifocal
pneumonia in the setting of IZKRX-64 positivity.

## 2021-09-13 ENCOUNTER — Other Ambulatory Visit: Payer: Self-pay | Admitting: Internal Medicine

## 2021-09-13 DIAGNOSIS — Z09 Encounter for follow-up examination after completed treatment for conditions other than malignant neoplasm: Secondary | ICD-10-CM

## 2021-10-12 ENCOUNTER — Ambulatory Visit
Admission: RE | Admit: 2021-10-12 | Discharge: 2021-10-12 | Disposition: A | Discharge status: Home / Self Care | Payer: Medicare Other | Source: Ambulatory Visit | Attending: Internal Medicine | Admitting: Internal Medicine

## 2021-10-12 ENCOUNTER — Other Ambulatory Visit: Payer: Self-pay

## 2021-10-12 DIAGNOSIS — R921 Mammographic calcification found on diagnostic imaging of breast: Secondary | ICD-10-CM | POA: Insufficient documentation

## 2021-10-12 DIAGNOSIS — Z09 Encounter for follow-up examination after completed treatment for conditions other than malignant neoplasm: Secondary | ICD-10-CM | POA: Insufficient documentation

## 2021-10-12 DIAGNOSIS — R922 Inconclusive mammogram: Secondary | ICD-10-CM | POA: Diagnosis not present

## 2021-10-14 DIAGNOSIS — E1159 Type 2 diabetes mellitus with other circulatory complications: Secondary | ICD-10-CM | POA: Diagnosis not present

## 2021-10-14 DIAGNOSIS — E1165 Type 2 diabetes mellitus with hyperglycemia: Secondary | ICD-10-CM | POA: Diagnosis not present

## 2021-10-14 DIAGNOSIS — E1169 Type 2 diabetes mellitus with other specified complication: Secondary | ICD-10-CM | POA: Diagnosis not present

## 2021-10-14 DIAGNOSIS — Z794 Long term (current) use of insulin: Secondary | ICD-10-CM | POA: Diagnosis not present

## 2021-12-14 DIAGNOSIS — E785 Hyperlipidemia, unspecified: Secondary | ICD-10-CM | POA: Diagnosis not present

## 2021-12-21 ENCOUNTER — Ambulatory Visit (INDEPENDENT_AMBULATORY_CARE_PROVIDER_SITE_OTHER): Payer: Medicare Other | Admitting: Family Medicine

## 2021-12-21 ENCOUNTER — Other Ambulatory Visit (HOSPITAL_COMMUNITY)
Admission: RE | Admit: 2021-12-21 | Discharge: 2021-12-21 | Disposition: A | Discharge status: Home / Self Care | Payer: Medicare Other | Source: Ambulatory Visit | Attending: Family Medicine | Admitting: Family Medicine

## 2021-12-21 ENCOUNTER — Encounter: Payer: Self-pay | Admitting: Family Medicine

## 2021-12-21 VITALS — BP 120/80 | Ht 63.0 in | Wt 228.0 lb

## 2021-12-21 DIAGNOSIS — Z1151 Encounter for screening for human papillomavirus (HPV): Secondary | ICD-10-CM | POA: Diagnosis not present

## 2021-12-21 DIAGNOSIS — Z01419 Encounter for gynecological examination (general) (routine) without abnormal findings: Secondary | ICD-10-CM

## 2021-12-21 DIAGNOSIS — E11649 Type 2 diabetes mellitus with hypoglycemia without coma: Secondary | ICD-10-CM | POA: Diagnosis not present

## 2021-12-21 DIAGNOSIS — N393 Stress incontinence (female) (male): Secondary | ICD-10-CM | POA: Diagnosis not present

## 2021-12-21 DIAGNOSIS — I10 Essential (primary) hypertension: Secondary | ICD-10-CM | POA: Diagnosis not present

## 2021-12-21 DIAGNOSIS — Z794 Long term (current) use of insulin: Secondary | ICD-10-CM

## 2021-12-21 NOTE — Progress Notes (Signed)
? ? ?GYNECOLOGY ANNUAL PREVENTATIVE CARE ENCOUNTER NOTE ? ?Subjective:  ? Charlene Parker is a 71 y.o. G19P2002 female here for a routine annual gynecologic exam.  Health is complicated by T2DM, HTN ? ?Current complaints: " you are running late" and "I don't know what I am allowed to ask based on how yall bill if I ask questions." ? ?I reviewed the patient's postmenopausal status and LMP in her 25s with no futher bleeding. She reported leaking urine with cough/sneeze and will wear pads at night and on long flights ?    ?Last visit at [facility] was in 2020 ? ?Denies abnormal vaginal bleeding, discharge, pelvic pain, problems with intercourse or other gynecologic concerns.  ?  ?Gynecologic History ?No LMP recorded. Patient is postmenopausal. ?Contraception: post menopausal status ?Last Pap: 2020. Results were: normal, HPV neg ?Last mammogram: Feb 2023- [facility] 3 ? ?Health Maintenance Due  ?Topic Date Due  ? OPHTHALMOLOGY EXAM  Never done  ? URINE MICROALBUMIN  Never done  ? Hepatitis C Screening  Never done  ? Zoster Vaccines- Shingrix (1 of 2) Never done  ? COLONOSCOPY (Pts 45-94yrs Insurance coverage will need to be confirmed)  Never done  ? FOOT EXAM  05/05/2016  ? Pneumonia Vaccine 33+ Years old (1 - PCV) Never done  ? DEXA SCAN  Never done  ? COVID-19 Vaccine (2 - Moderna risk series) 05/04/2020  ? HEMOGLOBIN A1C  10/19/2020  ? ? ?The following portions of the patient's history were reviewed and updated as appropriate: allergies, current medications, past family history, past medical history, past social history, past surgical history and problem list. ? ?Review of Systems ?Pertinent items are noted in HPI. ?  ?Objective:  ?BP 120/80   Ht 5\' 3"  (1.6 m)   Wt 228 lb (103.4 kg)   BMI 40.39 kg/m?  ?CONSTITUTIONAL: Well-developed, well-nourished female in no acute distress.  ?HENT:  Normocephalic, atraumatic, External right and left ear normal. Oropharynx is clear and moist ?EYES:  No scleral icterus.  ?NECK: Normal  range of motion, supple, no masses.  Normal thyroid.  ?SKIN: Skin is warm and dry. No rash noted. Not diaphoretic. No erythema. No pallor. ?NEUROLOGIC: Alert and oriented to person, place, and time. Normal reflexes, muscle tone coordination. No cranial nerve deficit noted. ?PSYCHIATRIC: Normal mood and affect. Normal behavior. Normal judgment and thought content. ?CARDIOVASCULAR: Normal heart rate noted, regular rhythm. 2+ distal pulses. ?RESPIRATORY: Effort and breath sounds normal, no problems with respiration noted. ?BREASTS: Symmetric in size. No masses, skin changes, nipple drainage, or lymphadenopathy. ?ABDOMEN: Soft,  no distention noted.  No tenderness, rebound or guarding.  ?PELVIC: Normal appearing external genitalia; Mildly atrophic vaginal mucosa and cervix. No abnormal discharge noted.  Pap smear obtained.  Normal uterine size, no other palpable masses, no uterine or adnexal tenderness. Cervix is about 3 inches from introitus, does not move much with valsalva.  ?MUSCULOSKELETAL: Normal range of motion.  ? ? ?Assessment and Plan:  ? ? ?1. Well woman exam with routine gynecological exam ?I discussed with the patient that by guidelines she does not need and it is not recommended she get a pap smear. I reviewed her last pap in 2020 with NIL, negative HPV and no prior history of abnormal pap smears. She is worried "something could just pop up and then is a problem." I reviewed that the likelihood of this is very low and that I do not recommend continued pap smears.  The patient chose to have a repeat pap  today which I performed at her request.  ?Routine preventative health maintenance measures emphasized. Reviewed perimenopausal symptoms and management.  ? ?Has annual visits with internal medicine ? ?2. Type 2 diabetes mellitus with hypoglycemia without coma, with long-term current use of insulin (HCC) ?On insulin and trulicity  ?Reports that doctors gave her diabetes with use of steroids for her knees ?I  provided active listening and validating of her view point with balanced discussion about the risks of uncontrolled T2DM ? ?3. Essential hypertension ?BP wnl ? ?4. Stress incontinence/prolapse (65465) ?- Reviewed that patient reports wearing pads on long flights and also leaks with sneezing/coughin ?- Offered referral to UroGyn and patient would rather monitor sx ?- Recommended kegel exercises ? ?Please refer to After Visit Summary for other counseling recommendations.  ? ?Return in about 1 year (around 12/22/2022) for Yearly wellness exam. ? ?Federico Flake, MD, MPH, ABFM ?Attending Physician ?[facility] for [facility] ? ?

## 2021-12-24 LAB — CYTOLOGY - PAP
Adequacy: ABSENT
Comment: NEGATIVE
Diagnosis: NEGATIVE
High risk HPV: NEGATIVE

## 2021-12-29 ENCOUNTER — Telehealth: Payer: Self-pay

## 2021-12-29 NOTE — Telephone Encounter (Signed)
Aram Beecham w/Dr. Katherina Right Tate's office in [place] calling to inquire if the Annual Wellness Visit was billed when the patient saw [place] on 12/21/21. She is scheduled at their office on Friday for a annual care visit so she can receive a check from [facility]. Cb#[phone] ?

## 2021-12-29 NOTE — Telephone Encounter (Addendum)
Spoke w/Cynthia. Advised charges I can see are [phone] Preventative Visit Established and [phone] Office Established with a 25 modifier. Message to Marella Bile to confirm.  She wants to verify that [id number] or [phone] was not added to our visit. ?

## 2021-12-31 DIAGNOSIS — Z0000 Encounter for general adult medical examination without abnormal findings: Secondary | ICD-10-CM | POA: Diagnosis not present

## 2021-12-31 DIAGNOSIS — E785 Hyperlipidemia, unspecified: Secondary | ICD-10-CM | POA: Diagnosis not present

## 2021-12-31 DIAGNOSIS — E119 Type 2 diabetes mellitus without complications: Secondary | ICD-10-CM | POA: Diagnosis not present

## 2021-12-31 NOTE — Telephone Encounter (Signed)
Reached Answering service for Dr Arlana Pouch office. Advised I will try them again on Monday.

## 2021-12-31 NOTE — Telephone Encounter (Signed)
Spoke with Feliciana Rossetti in Billing. She advised no [id number] or [phone] was charged.

## 2021-12-31 NOTE — Telephone Encounter (Signed)
Aram Beecham with Dr Maree Krabbe office is calling to follow up. She ask's to be call her back to day. Please advise?

## 2022-01-03 NOTE — Telephone Encounter (Signed)
Spoke with Aram Beecham at Dr. Maree Krabbe office. Advised no [id number] or [id number] charged for patient's 12/21/21 visit with [facility].

## 2022-04-10 DIAGNOSIS — I10 Essential (primary) hypertension: Secondary | ICD-10-CM | POA: Diagnosis not present

## 2022-04-10 DIAGNOSIS — E118 Type 2 diabetes mellitus with unspecified complications: Secondary | ICD-10-CM | POA: Diagnosis not present

## 2022-04-10 DIAGNOSIS — Z20822 Contact with and (suspected) exposure to covid-19: Secondary | ICD-10-CM | POA: Diagnosis not present

## 2022-04-10 DIAGNOSIS — E785 Hyperlipidemia, unspecified: Secondary | ICD-10-CM | POA: Diagnosis not present

## 2022-04-27 DIAGNOSIS — E1165 Type 2 diabetes mellitus with hyperglycemia: Secondary | ICD-10-CM | POA: Diagnosis not present

## 2022-04-27 DIAGNOSIS — Z794 Long term (current) use of insulin: Secondary | ICD-10-CM | POA: Diagnosis not present

## 2022-04-27 DIAGNOSIS — E1169 Type 2 diabetes mellitus with other specified complication: Secondary | ICD-10-CM | POA: Diagnosis not present

## 2022-04-27 DIAGNOSIS — E1159 Type 2 diabetes mellitus with other circulatory complications: Secondary | ICD-10-CM | POA: Diagnosis not present

## 2022-04-27 LAB — MICROALBUMIN / CREATININE URINE RATIO: Microalb Creat Ratio: 3.5

## 2022-04-27 LAB — HM DIABETES FOOT EXAM: HM Diabetic Foot Exam: NORMAL

## 2022-05-12 DIAGNOSIS — H26492 Other secondary cataract, left eye: Secondary | ICD-10-CM | POA: Diagnosis not present

## 2022-05-12 LAB — HM DIABETES EYE EXAM

## 2022-05-27 DIAGNOSIS — Z20822 Contact with and (suspected) exposure to covid-19: Secondary | ICD-10-CM | POA: Diagnosis not present

## 2022-05-27 DIAGNOSIS — R059 Cough, unspecified: Secondary | ICD-10-CM | POA: Diagnosis not present

## 2022-05-27 DIAGNOSIS — E785 Hyperlipidemia, unspecified: Secondary | ICD-10-CM | POA: Diagnosis not present

## 2022-05-30 DIAGNOSIS — H26491 Other secondary cataract, right eye: Secondary | ICD-10-CM | POA: Diagnosis not present

## 2022-06-13 DIAGNOSIS — E119 Type 2 diabetes mellitus without complications: Secondary | ICD-10-CM | POA: Diagnosis not present

## 2022-06-13 DIAGNOSIS — E785 Hyperlipidemia, unspecified: Secondary | ICD-10-CM | POA: Diagnosis not present

## 2022-06-13 DIAGNOSIS — Z23 Encounter for immunization: Secondary | ICD-10-CM | POA: Diagnosis not present

## 2022-08-11 DIAGNOSIS — R059 Cough, unspecified: Secondary | ICD-10-CM | POA: Diagnosis not present

## 2022-08-11 DIAGNOSIS — Z20822 Contact with and (suspected) exposure to covid-19: Secondary | ICD-10-CM | POA: Diagnosis not present

## 2022-08-11 DIAGNOSIS — J111 Influenza due to unidentified influenza virus with other respiratory manifestations: Secondary | ICD-10-CM | POA: Diagnosis not present

## 2022-10-06 DIAGNOSIS — K080 Exfoliation of teeth due to systemic causes: Secondary | ICD-10-CM | POA: Diagnosis not present

## 2022-10-18 DIAGNOSIS — E785 Hyperlipidemia, unspecified: Secondary | ICD-10-CM | POA: Diagnosis not present

## 2022-10-21 ENCOUNTER — Ambulatory Visit (INDEPENDENT_AMBULATORY_CARE_PROVIDER_SITE_OTHER): Payer: Medicare Other | Admitting: Physician Assistant

## 2022-10-21 ENCOUNTER — Encounter: Payer: Self-pay | Admitting: Physician Assistant

## 2022-10-21 VITALS — BP 128/84 | HR 75 | Ht 63.0 in | Wt 218.0 lb

## 2022-10-21 DIAGNOSIS — Z794 Long term (current) use of insulin: Secondary | ICD-10-CM

## 2022-10-21 DIAGNOSIS — K219 Gastro-esophageal reflux disease without esophagitis: Secondary | ICD-10-CM | POA: Diagnosis not present

## 2022-10-21 DIAGNOSIS — K5903 Drug induced constipation: Secondary | ICD-10-CM

## 2022-10-21 DIAGNOSIS — E119 Type 2 diabetes mellitus without complications: Secondary | ICD-10-CM | POA: Diagnosis not present

## 2022-10-21 NOTE — Progress Notes (Signed)
Date:  10/21/2022   Name:  Charlene Parker   DOB:  07-01-1951   MRN:  [id number]   Chief Complaint: Establish Care and Constipation (Has colonoscopy in July )   Charlene Parker is a very pleasant 72 year old female with a history of diabetes who presents new to the practice today to discuss relative constipation for the last 2 months.  Describes this as "feeling stopped up and achy", reports 1 bowel movement most days which is firm and takes time and effort to pass.  She hydrates well but does not exercise. She has associated decreased appetite and is down about 5 pounds over the last 6 months.  Endorses burning in the throat for which she has been using Tums nightly to control for years, but feels reflux is worse recently.  She also tells me she increased her Trulicity dose to 4.5 mg weekly around the time of symptom onset.  Initially she was using psyllium to help with BM, but switched to Dulcolax QD which seems to be working better for her.  Last colonoscopy was about 10 years ago and normal, repeat scheduled for July 2024.  She sees GYN for routine care with last physical 10 months ago and also has an endocrinologist Dr. Honor Junes who monitors her diabetes q11mand will be seeing her for f/u soon.  I have reviewed last notes for both of these providers.  Patient checks her fasting sugar most days, tells me recently it has been in the range of 160-250. She tells me she just had blood work 3 days ago but results are pending; she is not sure which doctor ordered this or what labs were ordered.    Perhaps unrelated, she reports struggling with "body odor", increased sweating, and radiating heat (per husband's report) - she clarifies this does not feel like hot flashes.  Routine labs including TSH essentially normal 04/27/2022, A1c 7.2% at that time.  Lab Results  Component Value Date   NA 138 06/21/2020   K 3.7 06/21/2020   CO2 18 (L) 06/21/2020   GLUCOSE 161 (H) 06/21/2020   BUN 13 06/21/2020    CREATININE 0.76 06/21/2020   CALCIUM 10.1 06/21/2020   GFRNONAA >60 06/21/2020   Lab Results  Component Value Date   CHOL 181 01/07/2012   HDL 70 (H) 01/07/2012   LDLCALC 75 01/07/2012   TRIG 96 04/19/2020   Lab Results  Component Value Date   TSH 2.807 05/01/2020   Lab Results  Component Value Date   HGBA1C 7.2 (H) 04/21/2020   Lab Results  Component Value Date   WBC 7.4 06/21/2020   HGB 14.0 06/21/2020   HCT 43.3 06/21/2020   MCV 84.7 06/21/2020   PLT 276 06/21/2020   Lab Results  Component Value Date   ALT 17 06/21/2020   AST 25 06/21/2020   ALKPHOS 65 06/21/2020   BILITOT 1.0 06/21/2020   No results found for: "25OHVITD2", "25OHVITD3", "VD25OH"   Review of Systems  Constitutional:  Positive for appetite change and diaphoresis.  Respiratory:  Negative for shortness of breath and wheezing.   Cardiovascular:  Negative for chest pain.  Gastrointestinal:  Positive for abdominal pain and constipation.  Endocrine: Positive for heat intolerance.  Genitourinary:  Negative for dysuria.    Patient Active Problem List   Diagnosis Date Noted   Diabetes mellitus type 2, insulin dependent ([facility] 10/21/2022   GERD (gastroesophageal reflux disease) 10/21/2022   Hypertension associated with diabetes ([facility] 01/16/2019   Hyperlipidemia associated with  type 2 diabetes mellitus ([place]) 01/16/2019    Allergies  Allergen Reactions   Prednisone Other (See Comments)    Diabetic ketoacidosis    Past Surgical History:  Procedure Laterality Date   CARPAL TUNNEL RELEASE Right    CATARACT EXTRACTION W/PHACO Right 09/29/2020   Procedure: CATARACT EXTRACTION PHACO AND INTRAOCULAR LENS PLACEMENT (IOC) RIGHT DIABETIC 4.05 00:33.4;  Surgeon: Birder Robson, MD;  Location: [facility];  Service: Ophthalmology;  Laterality: Right;   CATARACT EXTRACTION W/PHACO Left 10/20/2020   Procedure: CATARACT EXTRACTION PHACO AND INTRAOCULAR LENS PLACEMENT (IOC) LEFT DIABETIC 5.42 00:30.0;   Surgeon: Birder Robson, MD;  Location: [facility];  Service: Ophthalmology;  Laterality: Left;  Diabetic   CESAREAN SECTION     CHOLECYSTECTOMY     CHONDROPLASTY Left 07/20/2017   Procedure: CHONDROPLASTY;  Surgeon: Leim Fabry, MD;  Location: [facility];  Service: Orthopedics;  Laterality: Left;  Diabetic - insulin and oral meds   GALLBLADDER SURGERY     KNEE ARTHROSCOPY WITH MENISCAL REPAIR Left 07/20/2017   Procedure: KNEE ARTHROSCOPY WITH MENISCAL REPAIR;  Surgeon: Leim Fabry, MD;  Location: [facility];  Service: Orthopedics;  Laterality: Left;  ADDUCTOR CANAL NERVE BLOCK   KNEE SURGERY Right     Social History   Tobacco Use   Smoking status: Never   Smokeless tobacco: Never  Vaping Use   Vaping Use: Never used  Substance Use Topics   Alcohol use: No    Alcohol/week: 0.0 standard drinks of alcohol   Drug use: No     Medication list has been reviewed and updated.  Current Meds  Medication Sig   acetaminophen (TYLENOL) 500 MG tablet Take by mouth.   BD PEN NEEDLE NANO 2ND GEN 32G X 4 MM MISC USE 1 EACH ONCE DAILY   benzonatate (TESSALON) 100 MG capsule Take by mouth 3 (three) times daily as needed for cough.   bisacodyl (DULCOLAX) 5 MG EC tablet Take 5 mg by mouth daily as needed for moderate constipation.   Calcium Carbonate Antacid (TUMS PO) Take by mouth as needed.   insulin glargine (LANTUS SOLOSTAR) 100 UNIT/ML Solostar Pen Inject 40 Units into the skin.   metoprolol tartrate (LOPRESSOR) 25 MG tablet Take 25 mg by mouth 2 (two) times daily.   Multiple Vitamins-Minerals (ZINC PO) Take by mouth.   rosuvastatin (CRESTOR) 10 MG tablet Take by mouth.   TRULICITY 4.5 0000000 SOPN once a week.       10/21/2022    2:04 PM  GAD 7 : Generalized Anxiety Score  Nervous, Anxious, on Edge 0  Control/stop worrying 0  Worry too much - different things 0  Trouble relaxing 0  Restless 0  Easily annoyed or irritable 1  Afraid - awful might happen  0  Total GAD 7 Score 1  Anxiety Difficulty Not difficult at all       10/21/2022    2:04 PM  Depression screen PHQ 2/9  Decreased Interest 0  Down, Depressed, Hopeless 0  PHQ - 2 Score 0  Altered sleeping 2  Tired, decreased energy 2  Change in appetite 0  Feeling bad or failure about yourself  0  Trouble concentrating 0  Moving slowly or fidgety/restless 0  Suicidal thoughts 0  PHQ-9 Score 4  Difficult doing work/chores Not difficult at all    BP Readings from Last 3 Encounters:  10/21/22 128/84  12/21/21 120/80  10/20/20 109/67    Physical Exam Vitals and nursing note reviewed.  Constitutional:      Appearance: Normal appearance. She is obese.  Cardiovascular:     Rate and Rhythm: Normal rate and regular rhythm.     Heart sounds: Normal heart sounds.  Pulmonary:     Effort: Pulmonary effort is normal.     Breath sounds: Normal breath sounds.  Abdominal:     Comments: Bowel sounds hypoactive without significant tenderness of any quadrant, though she endorses mild epigastric discomfort with deep palpation     Wt Readings from Last 3 Encounters:  10/21/22 218 lb (98.9 kg)  12/21/21 228 lb (103.4 kg)  10/20/20 214 lb (97.1 kg)    BP 128/84 (BP Location: Left Arm, Patient Position: Sitting, Cuff Size: Large)   Pulse 75   Ht '5\' 3"'$  (1.6 m)   Wt 218 lb (98.9 kg)   SpO2 97%   BMI 38.62 kg/m   Assessment and Plan:  Drug-induced constipation  Gastroesophageal reflux disease without esophagitis  Diabetes mellitus type 2, insulin dependent (HCC)   Constipation - discussed with patient I feel this is most likely related to recent increase in Trulicity dose which matches the timing for symptom onset.  Reviewed with the patient the mechanism of action for Trulicity and similar medications to reduce gastric motility, which may cause decreased appetite, reflux symptoms, and bowel changes including constipation.  She has a history of sensitivity to diabetic medications  particularly when it comes to GI symptoms.  Advised her to discuss with her endocrinologist and consider reducing Trulicity dose to 3 mg/week and increasing basal insulin. Sweating/odor may be related to medication side effect versus glycemic changes.  Also reviewed general recommendations for constipation and reassured the patient that constipation is rarely worrisome as long as she is having at least 2-3 bowel movements per week, although I do recognize the symptoms may still be distressing to her.  Encouraged regular consumption of whole fruits and vegetables, adequate hydration, glycemic control, and routine physical activity with a daily walk to improve bowel motility. she may continue Dulcolax as needed, might also consider magnesium citrate solution in small volumes.  Keep colonoscopy as scheduled in July.  DM2  - Will hold off on repeat labs today considering she just had lab work done earlier this week and last A1c was reasonably controlled in September at 7.2%.  GERD -patient feels this is adequately controlled with Tums, but we discussed switching to PPI such as omeprazole if desired in the future  -education printed -f/u PRN -patient seems to have adequate and thorough routine care through GYN and endo   Partially dictated using Dragon software. Any errors are unintentional.  Lupita Leash, Hershal Coria, [facility]  10/21/2022

## 2022-10-21 NOTE — Patient Instructions (Signed)
-  I think your symptoms might be related to your dose of Trulicity. Ask your diabetes doctor about decreasing the dose if possible -eat whole fruits and vegetables -use OTC laxative such as dulcolax or magnesium citrate as needed for constipation -try to get a 68min walk in per day -follow-up as needed

## 2022-12-01 DIAGNOSIS — E1169 Type 2 diabetes mellitus with other specified complication: Secondary | ICD-10-CM | POA: Diagnosis not present

## 2022-12-01 DIAGNOSIS — E1159 Type 2 diabetes mellitus with other circulatory complications: Secondary | ICD-10-CM | POA: Diagnosis not present

## 2022-12-01 DIAGNOSIS — Z794 Long term (current) use of insulin: Secondary | ICD-10-CM | POA: Diagnosis not present

## 2022-12-01 DIAGNOSIS — E1165 Type 2 diabetes mellitus with hyperglycemia: Secondary | ICD-10-CM | POA: Diagnosis not present

## 2022-12-01 LAB — HEMOGLOBIN A1C: Hemoglobin A1C: 9.8

## 2022-12-26 ENCOUNTER — Telehealth: Payer: Self-pay | Admitting: Physician Assistant

## 2022-12-26 NOTE — Telephone Encounter (Signed)
Contacted Charlene Parker to schedule their annual wellness visit. Appointment made for 01/18/2023.  Verlee Rossetti; Care Guide Ambulatory Clinical Support [facility] Direct Dial: [phone]

## 2022-12-26 NOTE — Telephone Encounter (Signed)
Contacted Charlene Parker to schedule their annual wellness visit. Appointment made for 01/18/2023.  Charlene Parker; Care Guide Ambulatory Clinical Support [facility] Direct Dial: [phone]   

## 2023-01-05 LAB — RESULTS CONSOLE HPV: CHL HPV: NEGATIVE

## 2023-01-05 LAB — HM PAP SMEAR

## 2023-01-18 ENCOUNTER — Ambulatory Visit (INDEPENDENT_AMBULATORY_CARE_PROVIDER_SITE_OTHER): Payer: Medicare Other

## 2023-01-18 VITALS — Ht 63.0 in | Wt 218.0 lb

## 2023-01-18 DIAGNOSIS — Z0000 Encounter for general adult medical examination without abnormal findings: Secondary | ICD-10-CM | POA: Diagnosis not present

## 2023-01-18 DIAGNOSIS — Z780 Asymptomatic menopausal state: Secondary | ICD-10-CM | POA: Diagnosis not present

## 2023-01-18 NOTE — Patient Instructions (Signed)
Charlene Parker , Thank you for taking time to come for your Medicare Wellness Visit. I appreciate your ongoing commitment to your health goals. Please review the following plan we discussed and let me know if I can assist you in the future.   These are the goals we discussed:  Goals      Patient Stated     "Cholesterol to go away"         This is a list of the screening recommended for you and due dates:  Health Maintenance  Topic Date Due   Eye exam for diabetics  Never done   Yearly kidney health urinalysis for diabetes  Never done   Hepatitis C Screening  Never done   Colon Cancer Screening  Never done   Zoster (Shingles) Vaccine (1 of 2) Never done   Complete foot exam   05/05/2016   Pneumonia Vaccine (1 of 1 - PCV) Never done   DEXA scan (bone density measurement)  Never done   Hemoglobin A1C  10/19/2020   Yearly kidney function blood test for diabetes  06/21/2021   COVID-19 Vaccine (2 - 2023-24 season) 04/15/2022   Flu Shot  03/16/2023   Mammogram  10/13/2023   Medicare Annual Wellness Visit  01/18/2024   DTaP/Tdap/Td vaccine (2 - Td or Tdap) 12/10/2025   HPV Vaccine  Aged Out    Advanced directives: no  Conditions/risks identified: none  Next appointment: Follow up in one year for your annual wellness visit 01/24/24 @ 9:30 am by phone   Preventive Care 65 Years and Older, Female Preventive care refers to lifestyle choices and visits with your health care provider that can promote health and wellness. What does preventive care include? A yearly physical exam. This is also called an annual well check. Dental exams once or twice a year. Routine eye exams. Ask your health care provider how often you should have your eyes checked. Personal lifestyle choices, including: Daily care of your teeth and gums. Regular physical activity. Eating a healthy diet. Avoiding tobacco and drug use. Limiting alcohol use. Practicing safe sex. Taking low-dose aspirin every  day. Taking vitamin and mineral supplements as recommended by your health care provider. What happens during an annual well check? The services and screenings done by your health care provider during your annual well check will depend on your age, overall health, lifestyle risk factors, and family history of disease. Counseling  Your health care provider may ask you questions about your: Alcohol use. Tobacco use. Drug use. Emotional well-being. Home and relationship well-being. Sexual activity. Eating habits. History of falls. Memory and ability to understand (cognition). Work and work Astronomer. Reproductive health. Screening  You may have the following tests or measurements: Height, weight, and BMI. Blood pressure. Lipid and cholesterol levels. These may be checked every 5 years, or more frequently if you are over 36 years old. Skin check. Lung cancer screening. You may have this screening every year starting at age 21 if you have a 30-pack-year history of smoking and currently smoke or have quit within the past 15 years. Fecal occult blood test (FOBT) of the stool. You may have this test every year starting at age 95. Flexible sigmoidoscopy or colonoscopy. You may have a sigmoidoscopy every 5 years or a colonoscopy every 10 years starting at age 69. Hepatitis C blood test. Hepatitis B blood test. Sexually transmitted disease (STD) testing. Diabetes screening. This is done by checking your blood sugar (glucose) after you have not eaten for  a while (fasting). You may have this done every 1-3 years. Bone density scan. This is done to screen for osteoporosis. You may have this done starting at age 4. Mammogram. This may be done every 1-2 years. Talk to your health care provider about how often you should have regular mammograms. Talk with your health care provider about your test results, treatment options, and if necessary, the need for more tests. Vaccines  Your health care  provider may recommend certain vaccines, such as: Influenza vaccine. This is recommended every year. Tetanus, diphtheria, and acellular pertussis (Tdap, Td) vaccine. You may need a Td booster every 10 years. Zoster vaccine. You may need this after age 75. Pneumococcal 13-valent conjugate (PCV13) vaccine. One dose is recommended after age 84. Pneumococcal polysaccharide (PPSV23) vaccine. One dose is recommended after age 50. Talk to your health care provider about which screenings and vaccines you need and how often you need them. This information is not intended to replace advice given to you by your health care provider. Make sure you discuss any questions you have with your health care provider. Document Released: 08/28/2015 Document Revised: 04/20/2016 Document Reviewed: 06/02/2015 Elsevier Interactive Patient Education  2017 [employer].  Fall Prevention in the Home Falls can cause injuries. They can happen to people of all ages. There are many things you can do to make your home safe and to help prevent falls. What can I do on the outside of my home? Regularly fix the edges of walkways and driveways and fix any cracks. Remove anything that might make you trip as you walk through a door, such as a raised step or threshold. Trim any bushes or trees on the path to your home. Use bright outdoor lighting. Clear any walking paths of anything that might make someone trip, such as rocks or tools. Regularly check to see if handrails are loose or broken. Make sure that both sides of any steps have handrails. Any raised decks and porches should have guardrails on the edges. Have any leaves, snow, or ice cleared regularly. Use sand or salt on walking paths during winter. Clean up any spills in your garage right away. This includes oil or grease spills. What can I do in the bathroom? Use night lights. Install grab bars by the toilet and in the tub and shower. Do not use towel bars as grab  bars. Use non-skid mats or decals in the tub or shower. If you need to sit down in the shower, use a plastic, non-slip stool. Keep the floor dry. Clean up any water that spills on the floor as soon as it happens. Remove soap buildup in the tub or shower regularly. Attach bath mats securely with double-sided non-slip rug tape. Do not have throw rugs and other things on the floor that can make you trip. What can I do in the bedroom? Use night lights. Make sure that you have a light by your bed that is easy to reach. Do not use any sheets or blankets that are too big for your bed. They should not hang down onto the floor. Have a firm chair that has side arms. You can use this for support while you get dressed. Do not have throw rugs and other things on the floor that can make you trip. What can I do in the kitchen? Clean up any spills right away. Avoid walking on wet floors. Keep items that you use a lot in easy-to-reach places. If you need to reach something above  you, use a strong step stool that has a grab bar. Keep electrical cords out of the way. Do not use floor polish or wax that makes floors slippery. If you must use wax, use non-skid floor wax. Do not have throw rugs and other things on the floor that can make you trip. What can I do with my stairs? Do not leave any items on the stairs. Make sure that there are handrails on both sides of the stairs and use them. Fix handrails that are broken or loose. Make sure that handrails are as long as the stairways. Check any carpeting to make sure that it is firmly attached to the stairs. Fix any carpet that is loose or worn. Avoid having throw rugs at the top or bottom of the stairs. If you do have throw rugs, attach them to the floor with carpet tape. Make sure that you have a light switch at the top of the stairs and the bottom of the stairs. If you do not have them, ask someone to add them for you. What else can I do to help prevent  falls? Wear shoes that: Do not have high heels. Have rubber bottoms. Are comfortable and fit you well. Are closed at the toe. Do not wear sandals. If you use a stepladder: Make sure that it is fully opened. Do not climb a closed stepladder. Make sure that both sides of the stepladder are locked into place. Ask someone to hold it for you, if possible. Clearly mark and make sure that you can see: Any grab bars or handrails. First and last steps. Where the edge of each step is. Use tools that help you move around (mobility aids) if they are needed. These include: Canes. Walkers. Scooters. Crutches. Turn on the lights when you go into a dark area. Replace any light bulbs as soon as they burn out. Set up your furniture so you have a clear path. Avoid moving your furniture around. If any of your floors are uneven, fix them. If there are any pets around you, be aware of where they are. Review your medicines with your doctor. Some medicines can make you feel dizzy. This can increase your chance of falling. Ask your doctor what other things that you can do to help prevent falls. This information is not intended to replace advice given to you by your health care provider. Make sure you discuss any questions you have with your health care provider. Document Released: 05/28/2009 Document Revised: 01/07/2016 Document Reviewed: 09/05/2014 Elsevier Interactive Patient Education  2017 [employer].

## 2023-01-18 NOTE — Progress Notes (Signed)
I connected with  Charlene Parker on 01/18/23 by a audio enabled telemedicine application and verified that I am speaking with the correct person using two identifiers.  Patient Location: Home  Provider Location: Office/Clinic  I discussed the limitations of evaluation and management by telemedicine. The patient expressed understanding and agreed to proceed.  Subjective:   Charlene Parker is a 71 y.o. female who presents for Medicare Annual (Subsequent) preventive examination.  Review of Systems     Cardiac Risk Factors include: advanced age (>19men, >70 women);diabetes mellitus;hypertension;dyslipidemia     Objective:    There were no vitals filed for this visit. There is no height or weight on file to calculate BMI.     01/18/2023    9:45 AM 10/20/2020    9:33 AM 09/29/2020    6:46 AM 06/21/2020    6:33 PM 04/23/2020    2:00 AM 04/21/2020    1:30 PM 09/15/2018   10:43 AM  Advanced Directives  Does Patient Have a Medical Advance Directive? No Yes Yes No Unable to assess, patient is non-responsive or altered mental status No Yes  Type of Special educational needs teacher of [place];Living will Healthcare Power of [place];Living will    Healthcare Power of [place];Living will  Does patient want to make changes to medical advance directive?  No - Patient declined No - Patient declined      Copy of Healthcare Power of Attorney in Chart?  No - copy requested No - copy requested      Would patient like information on creating a medical advance directive? No - Patient declined   No - Patient declined  No - Patient declined     Current Medications (verified) Outpatient Encounter Medications as of 01/18/2023  Medication Sig   acetaminophen (TYLENOL) 500 MG tablet Take by mouth.   BD PEN NEEDLE NANO 2ND GEN 32G X 4 MM MISC USE 1 EACH ONCE DAILY   benzonatate (TESSALON) 100 MG capsule Take by mouth 3 (three) times daily as needed for cough.   bisacodyl (DULCOLAX) 5 MG EC tablet Take 5 mg  by mouth daily as needed for moderate constipation.   Calcium Carbonate Antacid (TUMS PO) Take by mouth as needed.   CONTOUR NEXT TEST test strip 2 (two) times daily.   glipiZIDE (GLUCOTROL XL) 5 MG 24 hr tablet Take 5 mg by mouth daily with breakfast.   insulin glargine (LANTUS SOLOSTAR) 100 UNIT/ML Solostar Pen Inject 40 Units into the skin.   metoprolol tartrate (LOPRESSOR) 25 MG tablet Take 25 mg by mouth 2 (two) times daily.   rosuvastatin (CRESTOR) 10 MG tablet Take by mouth.   TRULICITY 4.5 MG/0.5ML SOPN once a week.   Multiple Vitamins-Minerals (ZINC PO) Take by mouth. (Patient not taking: Reported on 01/18/2023)   No facility-administered encounter medications on file as of 01/18/2023.    Allergies (verified) Prednisone   History: Past Medical History:  Diagnosis Date   Arthritis    knee   COVID-19 04/10/2020   Diabetes mellitus without complication (HCC)    GERD (gastroesophageal reflux disease)    Hypertension    Past Surgical History:  Procedure Laterality Date   CARPAL TUNNEL RELEASE Right    CATARACT EXTRACTION W/PHACO Right 09/29/2020   Procedure: CATARACT EXTRACTION PHACO AND INTRAOCULAR LENS PLACEMENT (IOC) RIGHT DIABETIC 4.05 00:33.4;  Surgeon: Galen Manila, MD;  Location: [facility];  Service: Ophthalmology;  Laterality: Right;   CATARACT EXTRACTION W/PHACO Left 10/20/2020   Procedure: CATARACT EXTRACTION  PHACO AND INTRAOCULAR LENS PLACEMENT (IOC) LEFT DIABETIC 5.42 00:30.0;  Surgeon: Galen Manila, MD;  Location: [facility];  Service: Ophthalmology;  Laterality: Left;  Diabetic   CESAREAN SECTION     CHOLECYSTECTOMY     CHONDROPLASTY Left 07/20/2017   Procedure: CHONDROPLASTY;  Surgeon: Signa Kell, MD;  Location: [facility];  Service: Orthopedics;  Laterality: Left;  Diabetic - insulin and oral meds   GALLBLADDER SURGERY     KNEE ARTHROSCOPY WITH MENISCAL REPAIR Left 07/20/2017   Procedure: KNEE ARTHROSCOPY WITH MENISCAL REPAIR;   Surgeon: Signa Kell, MD;  Location: [facility];  Service: Orthopedics;  Laterality: Left;  ADDUCTOR CANAL NERVE BLOCK   KNEE SURGERY Right    Family History  Problem Relation Age of Onset   Diabetes Mother    Prostate cancer Father    Diabetes Sister    Diabetes Brother    Breast cancer Maternal Aunt        45s   Diabetes Maternal Aunt    Heart Problems Maternal Aunt    Breast cancer Maternal Aunt 80   Breast cancer Cousin        maternal   Breast cancer Cousin        maternal   Social History   Socioeconomic History   Marital status: Married    Spouse name: Not on file   Number of children: 2   Years of education: Not on file   Highest education level: Not on file  Occupational History   Not on file  Tobacco Use   Smoking status: Never   Smokeless tobacco: Never  Vaping Use   Vaping Use: Never used  Substance and Sexual Activity   Alcohol use: No    Alcohol/week: 0.0 standard drinks of alcohol   Drug use: No   Sexual activity: Not Currently    Birth control/protection: Post-menopausal  Other Topics Concern   Not on file  Social History Narrative   Not on file   Social Determinants of Health   Financial Resource Strain: Low Risk  (01/18/2023)   Overall Financial Resource Strain (CARDIA)    Difficulty of Paying Living Expenses: Not hard at all  Food Insecurity: No Food Insecurity (01/18/2023)   Hunger Vital Sign    Worried About Running Out of Food in the Last Year: Never true    Ran Out of Food in the Last Year: Never true  Transportation Needs: No Transportation Needs (01/18/2023)   PRAPARE - Administrator, Civil Service (Medical): No    Lack of Transportation (Non-Medical): No  Physical Activity: Insufficiently Active (01/18/2023)   Exercise Vital Sign    Days of Exercise per Week: 2 days    Minutes of Exercise per Session: 20 min  Stress: No Stress Concern Present (01/18/2023)   [employer] of Occupational Health - Occupational  Stress Questionnaire    Feeling of Stress : Not at all  Social Connections: Socially Integrated (01/18/2023)   Social Connection and Isolation Panel [NHANES]    Frequency of Communication with Friends and Family: Twice a week    Frequency of Social Gatherings with Friends and Family: Twice a week    Attends Religious Services: More than 4 times per year    Active Member of [employer] or Organizations: Yes    Attends Engineer, structural: More than 4 times per year    Marital Status: Married    Tobacco Counseling Counseling given: Not Answered   Clinical Intake:  Pre-visit  preparation completed: Yes  Pain : No/denies pain     Nutritional Risks: None Diabetes: Yes CBG done?: No Did pt. bring in CBG monitor from home?: No     Diabetic?yes Nutrition Risk Assessment:  Has the patient had any N/V/D within the last 2 months?  Yes  Does the patient have any non-healing wounds?  No  Has the patient had any unintentional weight loss or weight gain?  Yes   Diabetes:  Is the patient diabetic?  Yes  If diabetic, was a CBG obtained today?  No  Did the patient bring in their glucometer from home?  No  How often do you monitor your CBG's? Once per day.   Financial Strains and Diabetes Management:  Are you having any financial strains with the device, your supplies or your medication? No .  Does the patient want to be seen by Chronic Care Management for management of their diabetes?  No  Would the patient like to be referred to a Nutritionist or for Diabetic Management?  No   Diabetic Exams:  Diabetic Eye Exam: Completed LAST YEAR PER PATIENT. Pt has been advised about the importance in completing this exam. .  Diabetic Foot Exam: Completed 05/06/15. Pt has been advised about the importance in completing this exam.    Interpreter Needed?: No  Information entered by :: Kennedy Bucker, LPN   Activities of Daily Living    01/18/2023    9:48 AM  In your present state of  health, do you have any difficulty performing the following activities:  Hearing? 0  Vision? 0  Difficulty concentrating or making decisions? 0  Walking or climbing stairs? 1  Dressing or bathing? 0  Doing errands, shopping? 0  Preparing Food and eating ? N  Using the Toilet? N  In the past six months, have you accidently leaked urine? N  Do you have problems with loss of bowel control? N  Managing your Medications? N  Managing your Finances? N  Housekeeping or managing your Housekeeping? N    Patient Care Team: Remo Lipps, PA as PCP - General (Physician Assistant)  Indicate any recent Medical Services you may have received from other than [facility] providers in the past year (date may be approximate).     Assessment:   This is a routine wellness examination for [place].  Hearing/Vision screen Hearing Screening - Comments:: No aids Vision Screening - Comments:: [facility] - cataract sgy 2-3 yrs ago Uses readers  Dietary issues and exercise activities discussed: Current Exercise Habits: Home exercise routine, Type of exercise: Other - see comments;walking (some walking), Time (Minutes): 20, Frequency (Times/Week): 2, Weekly Exercise (Minutes/Week): 40, Intensity: Mild   Goals Addressed             This Visit's Progress    Patient Stated       "Cholesterol to go away"        Depression Screen    01/18/2023    9:43 AM 10/21/2022    2:04 PM  PHQ 2/9 Scores  PHQ - 2 Score 0 0  PHQ- 9 Score 0 4    Fall Risk    01/18/2023    9:47 AM 10/21/2022    2:05 PM  Fall Risk   Falls in the past year? 0 1  Number falls in past yr: 0 1  Injury with Fall? 0 0  Risk for fall due to : No Fall Risks History of fall(s)  Follow up Falls prevention discussed;Falls evaluation completed  Falls evaluation completed    FALL RISK PREVENTION PERTAINING TO THE HOME:  Any stairs in or around the home? Yes  If so, are there any without handrails? Yes  Home free of loose throw rugs in  walkways, pet beds, electrical cords, etc? Yes  Adequate lighting in your home to reduce risk of falls? Yes   ASSISTIVE DEVICES UTILIZED TO PREVENT FALLS:  Life alert? Yes  Use of a cane, walker or w/c? Yes -CANE SOME Grab bars in the bathroom? Yes  Shower chair or bench in shower? Yes  Elevated toilet seat or a handicapped toilet? Yes   Cognitive Function:        01/18/2023    9:58 AM  6CIT Screen  What Year? 0 points  What month? 0 points  What time? 0 points  Count back from 20 0 points  Months in reverse 0 points  Repeat phrase 0 points  Total Score 0 points    Immunizations Immunization History  Administered Date(s) Administered   Fluad Quad(high Dose 65+) 05/15/2022   Hepatitis B, ADULT 12/17/2015, 01/22/2016, 06/13/2016   Moderna Sars-Covid-2 Vaccination 04/06/2020   Tdap 12/11/2015    TDAP status: Up to date  Flu Vaccine status: Up to date  Pneumococcal vaccine status: Due, Education has been provided regarding the importance of this vaccine. Advised may receive this vaccine at local pharmacy or Health Dept. Aware to provide a copy of the vaccination record if obtained from local pharmacy or Health Dept. Verbalized acceptance and understanding.  Covid-19 vaccine status: Completed vaccines  Qualifies for Shingles Vaccine? Yes   Zostavax completed No   Shingrix Completed?: Yes  Screening Tests Health Maintenance  Topic Date Due   OPHTHALMOLOGY EXAM  Never done   Diabetic kidney evaluation - Urine ACR  Never done   Hepatitis C Screening  Never done   Colonoscopy  Never done   Zoster Vaccines- Shingrix (1 of 2) Never done   FOOT EXAM  05/05/2016   Pneumonia Vaccine 57+ Years old (1 of 1 - PCV) Never done   DEXA SCAN  Never done   HEMOGLOBIN A1C  10/19/2020   Diabetic kidney evaluation - eGFR measurement  06/21/2021   COVID-19 Vaccine (2 - 2023-24 season) 04/15/2022   INFLUENZA VACCINE  03/16/2023   MAMMOGRAM  10/13/2023   Medicare Annual Wellness  (AWV)  01/18/2024   DTaP/Tdap/Td (2 - Td or Tdap) 12/10/2025   HPV VACCINES  Aged Out    Health Maintenance  Health Maintenance Due  Topic Date Due   OPHTHALMOLOGY EXAM  Never done   Diabetic kidney evaluation - Urine ACR  Never done   Hepatitis C Screening  Never done   Colonoscopy  Never done   Zoster Vaccines- Shingrix (1 of 2) Never done   FOOT EXAM  05/05/2016   Pneumonia Vaccine 61+ Years old (1 of 1 - PCV) Never done   DEXA SCAN  Never done   HEMOGLOBIN A1C  10/19/2020   Diabetic kidney evaluation - eGFR measurement  06/21/2021   COVID-19 Vaccine (2 - 2023-24 season) 04/15/2022  Got Shingrix at [employer] [facility] per patient  Colorectal cancer screening: Type of screening: Colonoscopy. Completed SCHEDULED FOR JULY 2024. Repeat every 10 years  Mammogram status: Completed 10/12/21. Repeat every year  Bone Density status: Ordered 01/18/23. Pt provided with contact info and advised to call to schedule appt.  Lung Cancer Screening: (Low Dose CT Chest recommended if Age 97-80 years, 30 pack-year currently smoking OR have quit w/in 15years.)  does not qualify.   Additional Screening:  Hepatitis C Screening: does not qualify; Completed NO  Vision Screening: Recommended annual ophthalmology exams for early detection of glaucoma and other disorders of the eye. Is the patient up to date with their annual eye exam?  Yes  Who is the provider or what is the name of the office in which the patient attends annual eye exams? [facility] If pt is not established with a provider, would they like to be referred to a provider to establish care? No .   Dental Screening: Recommended annual dental exams for proper oral hygiene  Community Resource Referral / Chronic Care Management: CRR required this visit?  No   CCM required this visit?  No      Plan:     I have personally reviewed and noted the following in the patient's chart:   Medical and social history Use of alcohol, tobacco or  illicit drugs  Current medications and supplements including opioid prescriptions. Patient is not currently taking opioid prescriptions. Functional ability and status Nutritional status Physical activity Advanced directives List of other physicians Hospitalizations, surgeries, and ER visits in previous 12 months Vitals Screenings to include cognitive, depression, and falls Referrals and appointments  In addition, I have reviewed and discussed with patient certain preventive protocols, quality metrics, and best practice recommendations. A written personalized care plan for preventive services as well as general preventive health recommendations were provided to patient.     Hal Hope, LPN   04/15/4781   Nurse Notes: none

## 2023-01-18 NOTE — Addendum Note (Signed)
Addended by: Hal Hope on: 01/18/2023 12:10 PM   Modules accepted: Level of Service

## 2023-01-23 NOTE — Patient Instructions (Incomplete)
It was a pleasure meeting you today. Thank you for allowing me to take part in your health care.  Our goals for today as we discussed include:  Recommend Shingles vaccine.  This is a 2 dose series and can be given at your local pharmacy.  Please talk to your pharmacist about this.   Recommend Tetanus Vaccination.  This is given every 10 years.   Recommend Pneumonia 20 vaccine     If you have any questions or concerns, please do not hesitate to call the office at [phone].  I look forward to our next visit and until then take care and stay safe.  Regards,   Dana Allan, MD   [facility]

## 2023-01-24 ENCOUNTER — Ambulatory Visit (INDEPENDENT_AMBULATORY_CARE_PROVIDER_SITE_OTHER): Payer: Medicare Other | Admitting: Family Medicine

## 2023-01-24 ENCOUNTER — Encounter: Payer: Self-pay | Admitting: Family Medicine

## 2023-01-24 VITALS — BP 118/70 | HR 60 | Temp 97.4°F | Ht 63.0 in | Wt 221.2 lb

## 2023-01-24 DIAGNOSIS — E785 Hyperlipidemia, unspecified: Secondary | ICD-10-CM

## 2023-01-24 DIAGNOSIS — E1169 Type 2 diabetes mellitus with other specified complication: Secondary | ICD-10-CM | POA: Diagnosis not present

## 2023-01-24 DIAGNOSIS — E1159 Type 2 diabetes mellitus with other circulatory complications: Secondary | ICD-10-CM

## 2023-01-24 DIAGNOSIS — I152 Hypertension secondary to endocrine disorders: Secondary | ICD-10-CM | POA: Diagnosis not present

## 2023-01-24 DIAGNOSIS — E119 Type 2 diabetes mellitus without complications: Secondary | ICD-10-CM

## 2023-01-24 DIAGNOSIS — Z1231 Encounter for screening mammogram for malignant neoplasm of breast: Secondary | ICD-10-CM

## 2023-01-24 DIAGNOSIS — Z1159 Encounter for screening for other viral diseases: Secondary | ICD-10-CM

## 2023-01-24 DIAGNOSIS — K219 Gastro-esophageal reflux disease without esophagitis: Secondary | ICD-10-CM

## 2023-01-24 DIAGNOSIS — Z794 Long term (current) use of insulin: Secondary | ICD-10-CM

## 2023-01-24 NOTE — Progress Notes (Unsigned)
SUBJECTIVE:   Chief Complaint  Patient presents with  . Establish Care   HPI Patient presents to clinic to establish care.  No acute concerns today.  Diabetes type 2 Asymptomatic.  Takes Trulicity 3 mg injectable weekly, Lantus 45 units a.m.  Prescribed glipizide 5 mg daily reports not taking.  Follows with endocrinology at [place].  Recent A1c 9.8.  UACR completed 09/23.  Foot exam completed 9/23.  Last eye exam 05/12/2022 at [facility].  No retinopathy.  On statin therapy.  Blood pressure well-controlled.  Not on ACEi/ARB.    Previous medications tried and discontinued-metformin, diarrhea, Jardiance-polyuria  Hypertension Asymptomatic.  Prescribe metoprolol 25 mg twice daily.  Patient takes 25 mg 2 tabs daily.  Does not check blood pressures at home.  Hyperlipidemia Takes Crestor 10 mg daily.  Denies any myalgias or joint pain.  GERD Well controlled. Takes Tums and Pepcid 20 mg intermittently.    PERTINENT PMH / PSH: Hypertension Diabetes type 2 Hyperlipidemia GERD  OBJECTIVE:  BP 118/70   Pulse 60   Temp (!) 97.4 F (36.3 C)   Ht 5\' 3"  (1.6 m)   Wt 221 lb 3.2 oz (100.3 kg)   SpO2 99%   BMI 39.18 kg/m    Physical Exam Vitals reviewed.  Constitutional:      General: She is not in acute distress.    Appearance: She is obese. She is not ill-appearing.  HENT:     Head: Normocephalic.     Right Ear: Tympanic membrane, ear canal and external ear normal.     Left Ear: Tympanic membrane, ear canal and external ear normal.     Nose: Nose normal.     Mouth/Throat:     Mouth: Mucous membranes are moist.  Eyes:     Extraocular Movements: Extraocular movements intact.     Conjunctiva/sclera: Conjunctivae normal.     Pupils: Pupils are equal, round, and reactive to light.  Neck:     Thyroid: No thyromegaly or thyroid tenderness.     Vascular: No carotid bruit.  Cardiovascular:     Rate and Rhythm: Normal rate and regular rhythm.     Pulses: Normal  pulses.     Heart sounds: Normal heart sounds.  Pulmonary:     Effort: Pulmonary effort is normal.     Breath sounds: Normal breath sounds.  Abdominal:     General: Bowel sounds are normal. There is no distension.     Palpations: Abdomen is soft.     Tenderness: There is no abdominal tenderness. There is no right CVA tenderness, left CVA tenderness, guarding or rebound.  Musculoskeletal:        General: Normal range of motion.     Cervical back: Normal range of motion.     Right lower leg: No edema.     Left lower leg: No edema.  Lymphadenopathy:     Cervical: No cervical adenopathy.  Skin:    Capillary Refill: Capillary refill takes less than 2 seconds.  Neurological:     General: No focal deficit present.     Mental Status: She is alert and oriented to person, place, and time. Mental status is at baseline.     Motor: No weakness.  Psychiatric:        Mood and Affect: Mood normal.        Behavior: Behavior normal.        Thought Content: Thought content normal.        Judgment: Judgment normal.  ASSESSMENT/PLAN:  Hypertension associated with diabetes (HCC) -     Comprehensive metabolic panel  Gastroesophageal reflux disease without esophagitis  Diabetes mellitus type 2, insulin dependent (HCC)  Hyperlipidemia associated with type 2 diabetes mellitus (HCC) -     Lipid panel  Need for hepatitis C screening test -     Hepatitis C antibody   PDMP reviewed  Return in about 6 months (around 07/26/2023) for PCP.  Dana Allan, MD

## 2023-01-25 LAB — COMPREHENSIVE METABOLIC PANEL
ALT: 14 IU/L (ref 0–32)
AST: 17 IU/L (ref 0–40)
Albumin/Globulin Ratio: 1.7
Albumin: 4.3 g/dL (ref 3.8–4.8)
Alkaline Phosphatase: 90 IU/L (ref 44–121)
BUN/Creatinine Ratio: 22 (ref 12–28)
BUN: 15 mg/dL (ref 8–27)
Bilirubin Total: 0.5 mg/dL (ref 0.0–1.2)
CO2: 21 mmol/L (ref 20–29)
Calcium: 10.1 mg/dL (ref 8.7–10.3)
Chloride: 108 mmol/L — ABNORMAL HIGH (ref 96–106)
Creatinine, Ser: 0.67 mg/dL (ref 0.57–1.00)
Globulin, Total: 2.6 g/dL (ref 1.5–4.5)
Glucose: 143 mg/dL — ABNORMAL HIGH (ref 70–99)
Potassium: 4.2 mmol/L (ref 3.5–5.2)
Sodium: 142 mmol/L (ref 134–144)
Total Protein: 6.9 g/dL (ref 6.0–8.5)
eGFR: 93 mL/min/{1.73_m2} (ref >59)

## 2023-01-25 LAB — LIPID PANEL
Chol/HDL Ratio: 2.3 ratio (ref 0.0–4.4)
Cholesterol, Total: 153 mg/dL (ref 100–199)
HDL: 66 mg/dL (ref >39)
LDL Chol Calc (NIH): 73 mg/dL (ref 0–99)
Triglycerides: 73 mg/dL (ref 0–149)
VLDL Cholesterol Cal: 14 mg/dL (ref 5–40)

## 2023-01-25 LAB — HEPATITIS C ANTIBODY: Hep C Virus Ab: NONREACTIVE

## 2023-01-26 ENCOUNTER — Encounter: Payer: Self-pay | Admitting: *Deleted

## 2023-01-31 ENCOUNTER — Encounter: Payer: Self-pay | Admitting: Family Medicine

## 2023-01-31 DIAGNOSIS — Z1231 Encounter for screening mammogram for malignant neoplasm of breast: Secondary | ICD-10-CM | POA: Insufficient documentation

## 2023-01-31 DIAGNOSIS — Z1159 Encounter for screening for other viral diseases: Secondary | ICD-10-CM | POA: Insufficient documentation

## 2023-01-31 NOTE — Assessment & Plan Note (Signed)
Chronic.  Asymptomatic.  Recent A1c 9.8 Takes Lantus 45 units am, Trulicity 3 mg weekly. Has not taken Glipizide Foot exam completed 09/23 Eye exam complete 09/23.  Recommend she schedule annual appointment UACR 09/23 Follows with Endocrinology

## 2023-01-31 NOTE — Assessment & Plan Note (Signed)
Chronic.  Tolerating statin therapy Continue Crestor 10 mg daily Check fasting lipids

## 2023-01-31 NOTE — Assessment & Plan Note (Signed)
Chronic.  Well controlled on current medication Continue Metoprolol 25 mg BID Recommend monitoring blood pressure at home, goal <140/90 Check Cmet Recommend switching to ARB in future given history of DM. Follow up in 6 months

## 2023-01-31 NOTE — Assessment & Plan Note (Signed)
Continue Pepcid 20 mg as needed.

## 2023-02-09 ENCOUNTER — Other Ambulatory Visit: Payer: Self-pay | Admitting: Family Medicine

## 2023-02-09 DIAGNOSIS — R921 Mammographic calcification found on diagnostic imaging of breast: Secondary | ICD-10-CM

## 2023-02-28 ENCOUNTER — Ambulatory Visit
Admission: RE | Admit: 2023-02-28 | Discharge: 2023-02-28 | Disposition: A | Discharge status: Home / Self Care | Payer: Medicare Other | Source: Ambulatory Visit | Attending: Family Medicine | Admitting: Family Medicine

## 2023-02-28 DIAGNOSIS — R92323 Mammographic fibroglandular density, bilateral breasts: Secondary | ICD-10-CM | POA: Diagnosis not present

## 2023-02-28 DIAGNOSIS — R921 Mammographic calcification found on diagnostic imaging of breast: Secondary | ICD-10-CM | POA: Insufficient documentation

## 2023-02-28 DIAGNOSIS — R928 Other abnormal and inconclusive findings on diagnostic imaging of breast: Secondary | ICD-10-CM | POA: Diagnosis not present

## 2023-04-04 DIAGNOSIS — K080 Exfoliation of teeth due to systemic causes: Secondary | ICD-10-CM | POA: Diagnosis not present

## 2023-04-06 DIAGNOSIS — Z794 Long term (current) use of insulin: Secondary | ICD-10-CM | POA: Diagnosis not present

## 2023-04-06 DIAGNOSIS — E1159 Type 2 diabetes mellitus with other circulatory complications: Secondary | ICD-10-CM | POA: Diagnosis not present

## 2023-04-06 DIAGNOSIS — E1169 Type 2 diabetes mellitus with other specified complication: Secondary | ICD-10-CM | POA: Diagnosis not present

## 2023-04-06 DIAGNOSIS — E1165 Type 2 diabetes mellitus with hyperglycemia: Secondary | ICD-10-CM | POA: Diagnosis not present

## 2023-04-18 DIAGNOSIS — Z800 Family history of malignant neoplasm of digestive organs: Secondary | ICD-10-CM | POA: Diagnosis not present

## 2023-04-18 DIAGNOSIS — R1013 Epigastric pain: Secondary | ICD-10-CM | POA: Diagnosis not present

## 2023-04-18 DIAGNOSIS — K5900 Constipation, unspecified: Secondary | ICD-10-CM | POA: Diagnosis not present

## 2023-04-21 DIAGNOSIS — U071 COVID-19: Secondary | ICD-10-CM | POA: Diagnosis not present

## 2023-04-21 DIAGNOSIS — Z20822 Contact with and (suspected) exposure to covid-19: Secondary | ICD-10-CM | POA: Diagnosis not present

## 2023-04-26 ENCOUNTER — Telehealth: Payer: Self-pay | Admitting: Family Medicine

## 2023-04-26 NOTE — Telephone Encounter (Signed)
Found this from [facility]:   YOU HAVE BEEN SET UP TO RECEIVE THE "Trio Smart Breath Test". THE Trio Smart Breath Test KIT ARRIVE TO YOU BY MAIL. IF YOU HAVE NOT RECEIVED THE KIT BY 2 WEEKS, PLEASE CONTACT OUR OFFICE AT [phone]. PLEASE COMPLETE AND RETURN THE TEST KIT AS SOON AS POSSIBLE FOR FASTER TEST RESULTS. PLEASE FOLLOW THE DIRECTIONS THAT COME WITH THE TRIO KIT. IF YOU HAVE ANY QUESTIONS THERE SHOULD BE A HELP NUMBER INCLUDED INT HE KIT OR YOU MAY ALSO CONTACT OUR OFFICE.   PLEASE GO TO WEBSITE, [email or web address] FOR VIDEO INSTRUCTIONS ON HOW TO PERFORM THE BREATH TEST.  THANK YOU   PATIENT WILL CALL BACK WITH DATE.    I gave patient the number to contact [facility] to see if they could help her with this kit they ordered for her. Pt  states that she read the instructions and watched the video and it is just too much for her to figure out.

## 2023-04-26 NOTE — Telephone Encounter (Signed)
Patient's husband called and wanted to know if patient could come in with her TIO kit and have someone do it for her. Patient was advised that there should be a 1-800 number to call for help. Husband also had billing questions about the kit, he was also advised to call the 1-800 number that he saw on the box.

## 2023-05-05 NOTE — Telephone Encounter (Signed)
Called pt husband and scheduled pt an appointment for 05/10/2023 @1  pm

## 2023-05-05 NOTE — Telephone Encounter (Signed)
Patient's husband called and said the breath test the patient threw up away the balloons. She got frustrated with the test. Her husband also wants to know she is starting to have memory lost and would like for her to get tested to Alzheimer's. He says that he is concerned about his wife. His number is [phone].

## 2023-05-10 ENCOUNTER — Ambulatory Visit (INDEPENDENT_AMBULATORY_CARE_PROVIDER_SITE_OTHER): Payer: Medicare Other | Admitting: Family Medicine

## 2023-05-10 ENCOUNTER — Encounter: Payer: Self-pay | Admitting: Family Medicine

## 2023-05-10 VITALS — BP 130/68 | HR 59 | Temp 98.0°F | Resp 16 | Ht 63.0 in | Wt 221.0 lb

## 2023-05-10 DIAGNOSIS — R413 Other amnesia: Secondary | ICD-10-CM | POA: Diagnosis not present

## 2023-05-10 DIAGNOSIS — E538 Deficiency of other specified B group vitamins: Secondary | ICD-10-CM | POA: Diagnosis not present

## 2023-05-10 DIAGNOSIS — E559 Vitamin D deficiency, unspecified: Secondary | ICD-10-CM

## 2023-05-10 NOTE — Progress Notes (Signed)
SUBJECTIVE:   Chief Complaint  Patient presents with   Memory Loss   HPI Patient presents to clinic with husband for concern for memory loss  Husband reports that patient has become increasingly forgetful.  Recently left wallet at church which was unusual.  She did report knowing where to go and retrieve wallet. Has no issues with ADLs/IDLs. Has not driven as husband is afraid she will get lost especially after dark.  Other things that concern husband is that patient goes to store and does not remember what she is going for but she does indicate that she always comes home with what she had went to purchase.  Changes in memory have been slowly progressing.    PERTINENT PMH / PSH: HTN DM Type 2  OBJECTIVE:  BP 130/68   Pulse (!) 59   Temp 98 F (36.7 C)   Resp 16   Ht 5\' 3"  (1.6 m)   Wt 221 lb (100.2 kg)   SpO2 97%   BMI 39.15 kg/m    Physical Exam Vitals reviewed.  Constitutional:      General: She is not in acute distress.    Appearance: Normal appearance. She is normal weight. She is not ill-appearing, toxic-appearing or diaphoretic.  Eyes:     General:        Right eye: No discharge.        Left eye: No discharge.     Conjunctiva/sclera: Conjunctivae normal.  Cardiovascular:     Rate and Rhythm: Normal rate and regular rhythm.     Heart sounds: Normal heart sounds.  Pulmonary:     Effort: Pulmonary effort is normal.     Breath sounds: Normal breath sounds.  Abdominal:     General: Bowel sounds are normal.  Musculoskeletal:        General: Normal range of motion.  Skin:    General: Skin is warm and dry.  Neurological:     General: No focal deficit present.     Mental Status: She is alert and oriented to person, place, and time. Mental status is at baseline.     Motor: No weakness.     Coordination: Coordination normal.     Gait: Gait normal.  Psychiatric:        Mood and Affect: Mood normal.        Behavior: Behavior normal.        Thought Content:  Thought content normal.        Cognition and Memory: Memory normal.        Judgment: Judgment normal.        05/10/2023   12:57 PM 01/24/2023    9:23 AM 01/18/2023    9:43 AM 10/21/2022    2:04 PM  Depression screen PHQ 2/9  Decreased Interest 0 0 0 0  Down, Depressed, Hopeless 0 0 0 0  PHQ - 2 Score 0 0 0 0  Altered sleeping 0 0 0 2  Tired, decreased energy 0 0 0 2  Change in appetite 1 0 0 0  Feeling bad or failure about yourself  0 0 0 0  Trouble concentrating 0 0 0 0  Moving slowly or fidgety/restless 0 0 0 0  Suicidal thoughts 0 0 0 0  PHQ-9 Score 1 0 0 4  Difficult doing work/chores Not difficult at all Not difficult at all Not difficult at all Not difficult at all      05/10/2023   12:57 PM 01/24/2023    9:23  AM 10/21/2022    2:04 PM  GAD 7 : Generalized Anxiety Score  Nervous, Anxious, on Edge 0 0 0  Control/stop worrying 0 0 0  Worry too much - different things 0 0 0  Trouble relaxing 1 0 0  Restless 0 0 0  Easily annoyed or irritable 0 0 1  Afraid - awful might happen 0 0 0  Total GAD 7 Score 1 0 1  Anxiety Difficulty Not difficult at all Not difficult at all Not difficult at all    ASSESSMENT/PLAN:  Memory change -     Ambulatory referral to Neurology -     Comprehensive metabolic panel; Future -     Folate; Future -     Iron, TIBC and Ferritin Panel; Future -     TSH; Future -     Vitamin B12; Future -     VITAMIN D 25 Hydroxy (Vit-D Deficiency, Fractures); Future  Memory changes Assessment & Plan: Slow progression MiniCog score 2/5 Labs today Refer to Neurology for evaluation   Vitamin B 12 deficiency Assessment & Plan: Vitamin B 12 low Start Vitamin B 12 1000 mcg daily   Vitamin D deficiency Assessment & Plan: Vitamin D low Start Vitamin D 1.25 mcg weekly    PDMP reviewed  Return if symptoms worsen or fail to improve, for PCP.  Dana Allan, MD

## 2023-05-10 NOTE — Patient Instructions (Addendum)
It was a pleasure meeting you today. Thank you for allowing me to take part in your health care.  Our goals for today as we discussed include:  We will get some labs today.  If they are abnormal or we need to do something about them, I will call you.  If they are normal, I will send you a message on MyChart (if it is active) or a letter in the mail.  If you don't hear from [place] in 2 weeks, please call the office at the number below.   Recommend hearing screening  Recommend Pneumonia vaccine  Referral sent to Neurology   If you have any questions or concerns, please do not hesitate to call the office at [phone].  I look forward to our next visit and until then take care and stay safe.  Regards,   Dana Allan, MD   [facility]

## 2023-05-11 ENCOUNTER — Other Ambulatory Visit (INDEPENDENT_AMBULATORY_CARE_PROVIDER_SITE_OTHER): Payer: Medicare Other

## 2023-05-11 ENCOUNTER — Encounter: Payer: Self-pay | Admitting: Family Medicine

## 2023-05-11 ENCOUNTER — Other Ambulatory Visit: Payer: Self-pay | Admitting: Family Medicine

## 2023-05-11 DIAGNOSIS — E559 Vitamin D deficiency, unspecified: Secondary | ICD-10-CM

## 2023-05-11 DIAGNOSIS — R413 Other amnesia: Secondary | ICD-10-CM | POA: Diagnosis not present

## 2023-05-11 DIAGNOSIS — E538 Deficiency of other specified B group vitamins: Secondary | ICD-10-CM

## 2023-05-11 LAB — COMPREHENSIVE METABOLIC PANEL
ALT: 11 U/L (ref 0–35)
AST: 14 U/L (ref 0–37)
Albumin: 4 g/dL (ref 3.5–5.2)
Alkaline Phosphatase: 76 U/L (ref 39–117)
BUN: 19 mg/dL (ref 6–23)
CO2: 24 mEq/L (ref 19–32)
Calcium: 10.1 mg/dL (ref 8.4–10.5)
Chloride: 107 mEq/L (ref 96–112)
Creatinine, Ser: 0.78 mg/dL (ref 0.40–1.20)
GFR: 76.25 mL/min (ref >60.00)
Glucose, Bld: 156 mg/dL — ABNORMAL HIGH (ref 70–99)
Potassium: 4 mEq/L (ref 3.5–5.1)
Sodium: 139 mEq/L (ref 135–145)
Total Bilirubin: 0.5 mg/dL (ref 0.2–1.2)
Total Protein: 7.3 g/dL (ref 6.0–8.3)

## 2023-05-11 LAB — VITAMIN B12: Vitamin B-12: 206 pg/mL — ABNORMAL LOW (ref 211–911)

## 2023-05-11 LAB — VITAMIN D 25 HYDROXY (VIT D DEFICIENCY, FRACTURES): VITD: 19.51 ng/mL — ABNORMAL LOW (ref 30.00–100.00)

## 2023-05-11 LAB — FOLATE: Folate: 10 ng/mL (ref >5.9)

## 2023-05-11 LAB — TSH: TSH: 1.51 u[IU]/mL (ref 0.35–5.50)

## 2023-05-11 MED ORDER — VITAMIN D (ERGOCALCIFEROL) 1.25 MG (50000 UNIT) PO CAPS
50000.0000 [IU] | ORAL_CAPSULE | ORAL | 1 refills | Status: AC
Start: 2023-05-11 — End: ?

## 2023-05-11 MED ORDER — VITAMIN B-12 1000 MCG SL SUBL
2000.0000 ug | SUBLINGUAL_TABLET | Freq: Every day | SUBLINGUAL | 3 refills | Status: AC
Start: 2023-05-11 — End: ?

## 2023-05-12 ENCOUNTER — Other Ambulatory Visit: Payer: Self-pay

## 2023-05-12 ENCOUNTER — Telehealth: Payer: Self-pay

## 2023-05-12 ENCOUNTER — Other Ambulatory Visit: Payer: Self-pay | Admitting: Family Medicine

## 2023-05-12 DIAGNOSIS — E538 Deficiency of other specified B group vitamins: Secondary | ICD-10-CM

## 2023-05-12 DIAGNOSIS — E559 Vitamin D deficiency, unspecified: Secondary | ICD-10-CM

## 2023-05-12 LAB — IRON,TIBC AND FERRITIN PANEL
%SAT: 19 % (ref 16–45)
Ferritin: 130 ng/mL (ref 16–288)
Iron: 51 ug/dL (ref 45–160)
TIBC: 269 ug/dL (ref 250–450)

## 2023-05-12 MED ORDER — VITAMIN B-12 1000 MCG SL SUBL: 2000.0000 ug | SUBLINGUAL_TABLET | Freq: Every day | SUBLINGUAL | 3 refills | Status: DC

## 2023-05-12 MED ORDER — VITAMIN B 12 500 MCG PO TABS
1000.0000 ug | ORAL_TABLET | Freq: Every day | ORAL | 3 refills | Status: DC
Start: 2023-05-12 — End: 2023-12-17

## 2023-05-12 MED ORDER — VITAMIN D (ERGOCALCIFEROL) 1.25 MG (50000 UNIT) PO CAPS: 50000.0000 [IU] | ORAL_CAPSULE | ORAL | 1 refills | Status: DC

## 2023-05-12 NOTE — Telephone Encounter (Signed)
Pt called and said that the pharmacy does not have the sublingual vitamin B and needed it to be changed to another form.

## 2023-05-19 ENCOUNTER — Encounter: Payer: Self-pay | Admitting: Family Medicine

## 2023-05-19 DIAGNOSIS — R413 Other amnesia: Secondary | ICD-10-CM | POA: Insufficient documentation

## 2023-05-19 DIAGNOSIS — E559 Vitamin D deficiency, unspecified: Secondary | ICD-10-CM | POA: Insufficient documentation

## 2023-05-19 DIAGNOSIS — E538 Deficiency of other specified B group vitamins: Secondary | ICD-10-CM | POA: Insufficient documentation

## 2023-05-19 NOTE — Assessment & Plan Note (Signed)
Slow progression MiniCog score 2/5 Labs today Refer to Neurology for evaluation

## 2023-05-19 NOTE — Assessment & Plan Note (Signed)
Vitamin D low Start Vitamin D 1.25 mcg weekly

## 2023-05-19 NOTE — Assessment & Plan Note (Signed)
Vitamin B 12 low Start Vitamin B 12 1000 mcg daily

## 2023-06-15 DIAGNOSIS — E119 Type 2 diabetes mellitus without complications: Secondary | ICD-10-CM | POA: Diagnosis not present

## 2023-06-15 DIAGNOSIS — Z0100 Encounter for examination of eyes and vision without abnormal findings: Secondary | ICD-10-CM | POA: Diagnosis not present

## 2023-06-15 DIAGNOSIS — H43813 Vitreous degeneration, bilateral: Secondary | ICD-10-CM | POA: Diagnosis not present

## 2023-06-15 DIAGNOSIS — Z961 Presence of intraocular lens: Secondary | ICD-10-CM | POA: Diagnosis not present

## 2023-06-15 LAB — HM DIABETES EYE EXAM

## 2023-07-28 ENCOUNTER — Encounter: Payer: Self-pay | Admitting: *Deleted

## 2023-07-28 ENCOUNTER — Ambulatory Visit: Payer: Medicare Other | Admitting: Anesthesiology

## 2023-07-28 ENCOUNTER — Encounter
Admission: RE | Disposition: A | Discharge status: Home / Self Care | Payer: Self-pay | Source: Home / Self Care | Attending: Gastroenterology

## 2023-07-28 ENCOUNTER — Ambulatory Visit
Admission: RE | Admit: 2023-07-28 | Discharge: 2023-07-28 | Disposition: A | Discharge status: Home / Self Care | Payer: Medicare Other | Attending: Gastroenterology | Admitting: Gastroenterology

## 2023-07-28 DIAGNOSIS — K2100 Gastro-esophageal reflux disease with esophagitis, without bleeding: Secondary | ICD-10-CM | POA: Diagnosis not present

## 2023-07-28 DIAGNOSIS — K2970 Gastritis, unspecified, without bleeding: Secondary | ICD-10-CM | POA: Insufficient documentation

## 2023-07-28 DIAGNOSIS — Z1211 Encounter for screening for malignant neoplasm of colon: Secondary | ICD-10-CM | POA: Diagnosis not present

## 2023-07-28 DIAGNOSIS — Z7985 Long-term (current) use of injectable non-insulin antidiabetic drugs: Secondary | ICD-10-CM | POA: Diagnosis not present

## 2023-07-28 DIAGNOSIS — E669 Obesity, unspecified: Secondary | ICD-10-CM | POA: Diagnosis not present

## 2023-07-28 DIAGNOSIS — K449 Diaphragmatic hernia without obstruction or gangrene: Secondary | ICD-10-CM | POA: Insufficient documentation

## 2023-07-28 DIAGNOSIS — E119 Type 2 diabetes mellitus without complications: Secondary | ICD-10-CM | POA: Diagnosis not present

## 2023-07-28 DIAGNOSIS — K2960 Other gastritis without bleeding: Secondary | ICD-10-CM | POA: Diagnosis not present

## 2023-07-28 DIAGNOSIS — I10 Essential (primary) hypertension: Secondary | ICD-10-CM | POA: Diagnosis not present

## 2023-07-28 DIAGNOSIS — K640 First degree hemorrhoids: Secondary | ICD-10-CM | POA: Diagnosis not present

## 2023-07-28 DIAGNOSIS — Z09 Encounter for follow-up examination after completed treatment for conditions other than malignant neoplasm: Secondary | ICD-10-CM | POA: Diagnosis not present

## 2023-07-28 DIAGNOSIS — K209 Esophagitis, unspecified without bleeding: Secondary | ICD-10-CM | POA: Diagnosis not present

## 2023-07-28 DIAGNOSIS — Z800 Family history of malignant neoplasm of digestive organs: Secondary | ICD-10-CM | POA: Diagnosis not present

## 2023-07-28 DIAGNOSIS — Z794 Long term (current) use of insulin: Secondary | ICD-10-CM | POA: Insufficient documentation

## 2023-07-28 DIAGNOSIS — Z6838 Body mass index (BMI) 38.0-38.9, adult: Secondary | ICD-10-CM | POA: Diagnosis not present

## 2023-07-28 DIAGNOSIS — K649 Unspecified hemorrhoids: Secondary | ICD-10-CM | POA: Diagnosis not present

## 2023-07-28 HISTORY — PX: BIOPSY: SHX5522

## 2023-07-28 HISTORY — PX: ESOPHAGOGASTRODUODENOSCOPY (EGD) WITH PROPOFOL: SHX5813

## 2023-07-28 HISTORY — PX: COLONOSCOPY WITH PROPOFOL: SHX5780

## 2023-07-28 LAB — GLUCOSE, CAPILLARY: Glucose-Capillary: 157 mg/dL — ABNORMAL HIGH (ref 70–99)

## 2023-07-28 SURGERY — COLONOSCOPY WITH PROPOFOL
Anesthesia: General

## 2023-07-28 MED ORDER — SODIUM CHLORIDE 0.9 % IV SOLN: INTRAVENOUS | Status: DC

## 2023-07-28 MED ORDER — PROPOFOL 10 MG/ML IV BOLUS
INTRAVENOUS | Status: DC | PRN
  Administered 2023-07-28: 120 ug/kg/min via INTRAVENOUS
  Administered 2023-07-28: 50 mg via INTRAVENOUS

## 2023-07-28 MED ORDER — PROPOFOL 10 MG/ML IV BOLUS
INTRAVENOUS | Status: AC
Start: 2023-07-28 — End: ?
  Filled 2023-07-28: qty 40

## 2023-07-28 MED ORDER — FENTANYL CITRATE (PF) 100 MCG/2ML IJ SOLN
INTRAMUSCULAR | Status: AC
  Filled 2023-07-28: qty 2

## 2023-07-28 MED ORDER — LIDOCAINE HCL (CARDIAC) PF 100 MG/5ML IV SOSY
PREFILLED_SYRINGE | INTRAVENOUS | Status: DC | PRN
  Administered 2023-07-28: 50 mg via INTRAVENOUS

## 2023-07-28 MED ORDER — LIDOCAINE HCL (PF) 2 % IJ SOLN
INTRAMUSCULAR | Status: AC
  Filled 2023-07-28: qty 5

## 2023-07-28 MED ORDER — DEXMEDETOMIDINE HCL IN NACL 200 MCG/50ML IV SOLN
INTRAVENOUS | Status: DC | PRN
  Administered 2023-07-28: 8 ug via INTRAVENOUS

## 2023-07-28 MED ORDER — FENTANYL CITRATE (PF) 100 MCG/2ML IJ SOLN
INTRAMUSCULAR | Status: DC | PRN
  Administered 2023-07-28: 50 ug via INTRAVENOUS

## 2023-07-28 MED ORDER — PHENYLEPHRINE 80 MCG/ML (10ML) SYRINGE FOR IV PUSH (FOR BLOOD PRESSURE SUPPORT)
PREFILLED_SYRINGE | INTRAVENOUS | Status: DC | PRN
  Administered 2023-07-28 (×2): 80 ug via INTRAVENOUS

## 2023-07-28 NOTE — H&P (Signed)
Outpatient short stay form Pre-procedure 07/28/2023  Regis Bill, MD  Primary Physician: Dana Allan, MD  Reason for visit:  Dyspepsia/Constipation  History of present illness:    72 y/o lady with history of obesity, DM II, and arthritis here for EGD/Colonoscopy for dyspepsia and new onset constipation. Takes mounjaro with last dose last Saturday. No blood thinners. No significant abdominal surgeries. Father with colon cancer in his 23's. Last colonoscopy was in 2009 and was normal.    Current Facility-Administered Medications:    0.9 %  sodium chloride infusion, , Intravenous, Continuous, Maliya Marich, Rossie Muskrat, MD, Last Rate: 20 mL/hr at 07/28/23 1135, New Bag at 07/28/23 1135  Medications Prior to Admission  Medication Sig Dispense Refill Last Dose/Taking   Cyanocobalamin (VITAMIN B 12) 500 MCG TABS Take 1,000 mcg by mouth daily. 90 tablet 3 Past Week   insulin glargine (LANTUS SOLOSTAR) 100 UNIT/ML Solostar Pen Inject 40 Units into the skin.   07/28/2023 at 12:15 AM   meloxicam (MOBIC) 7.5 MG tablet Take 7.5 mg by mouth daily.   07/27/2023   metoprolol tartrate (LOPRESSOR) 25 MG tablet Take 25 mg by mouth 2 (two) times daily.   07/27/2023   rosuvastatin (CRESTOR) 10 MG tablet Take by mouth.   07/27/2023   Vitamin D, Ergocalciferol, (DRISDOL) 1.25 MG (50000 UNIT) CAPS capsule Take 1 capsule (50,000 Units total) by mouth every 7 (seven) days. 12 capsule 1 Past Week   acetaminophen (TYLENOL) 500 MG tablet Take by mouth.      BD PEN NEEDLE NANO 2ND GEN 32G X 4 MM MISC USE 1 EACH ONCE DAILY      bisacodyl (DULCOLAX) 5 MG EC tablet Take 5 mg by mouth daily as needed for moderate constipation.      Calcium Carbonate Antacid (TUMS PO) Take by mouth as needed.      CONTOUR NEXT TEST test strip 2 (two) times daily.      MOUNJARO 2.5 MG/0.5ML Pen Inject 2.5 mg into the skin once a week.   07/22/2023   Multiple Vitamins-Minerals (ZINC PO) Take by mouth. (Patient not taking: Reported on  05/10/2023)        Allergies  Allergen Reactions   Prednisone Other (See Comments)    Diabetic ketoacidosis     Past Medical History:  Diagnosis Date   Arthritis    knee   COVID-19 04/10/2020   Diabetes mellitus without complication (HCC)    Frequent headaches    GERD (gastroesophageal reflux disease)    Hypertension     Review of systems:  Otherwise negative.    Physical Exam  Gen: Alert, oriented. Appears stated age.  HEENT: PERRLA. Lungs: No respiratory distress CV: RRR Abd: soft, benign, no masses Ext: No edema    Planned procedures: Proceed with EGD/colonoscopy. The patient understands the nature of the planned procedure, indications, risks, alternatives and potential complications including but not limited to bleeding, infection, perforation, damage to internal organs and possible oversedation/side effects from anesthesia. The patient agrees and gives consent to proceed.  Please refer to procedure notes for findings, recommendations and patient disposition/instructions.     Regis Bill, MD [facility]

## 2023-07-28 NOTE — Op Note (Signed)
[facility] Patient Name: Charlene Parker Procedure Date: 07/28/2023 11:54 AM MRN: [id number] Account #: [id number] Date of Birth: 02/01/1951 Admit Type: Outpatient Age: 72 Room: [facility] ENDO ROOM 3 Gender: Female Note Status: Finalized Instrument Name: Patton Salles Endoscope [id number] Procedure:             Upper GI endoscopy Indications:           Dyspepsia Providers:             Eather Colas MD, MD Medicines:             Monitored Anesthesia Care Complications:         No immediate complications. Estimated blood loss:                         Minimal. Procedure:             Pre-Anesthesia Assessment:                        - Prior to the procedure, a History and Physical was                         performed, and patient medications and allergies were                         reviewed. The patient is competent. The risks and                         benefits of the procedure and the sedation options and                         risks were discussed with the patient. All questions                         were answered and informed consent was obtained.                         Patient identification and proposed procedure were                         verified by the physician, the nurse, the                         anesthesiologist, the anesthetist and the technician                         in the endoscopy suite. Mental Status Examination:                         alert and oriented. Airway Examination: normal                         oropharyngeal airway and neck mobility. Respiratory                         Examination: clear to auscultation. CV Examination:                         normal. Prophylactic Antibiotics: The patient does not  require prophylactic antibiotics. Prior                         Anticoagulants: The patient has taken no anticoagulant                         or antiplatelet agents. ASA Grade Assessment: III - A                          patient with severe systemic disease. After reviewing                         the risks and benefits, the patient was deemed in                         satisfactory condition to undergo the procedure. The                         anesthesia plan was to use monitored anesthesia care                         (MAC). Immediately prior to administration of                         medications, the patient was re-assessed for adequacy                         to receive sedatives. The heart rate, respiratory                         rate, oxygen saturations, blood pressure, adequacy of                         pulmonary ventilation, and response to care were                         monitored throughout the procedure. The physical                         status of the patient was re-assessed after the                         procedure.                        After obtaining informed consent, the endoscope was                         passed under direct vision. Throughout the procedure,                         the patient's blood pressure, pulse, and oxygen                         saturations were monitored continuously. The Endoscope                         was introduced through the mouth, and advanced to the  second part of duodenum. The upper GI endoscopy was                         accomplished without difficulty. The patient tolerated                         the procedure well. Findings:      LA Grade C (one or more mucosal breaks continuous between tops of 2 or       more mucosal folds, less than 75% circumference) esophagitis with no       bleeding was found.      A small hiatal hernia was present.      Patchy mild inflammation characterized by erythema was found in the       gastric antrum. Biopsies were taken with a cold forceps for Helicobacter       pylori testing. Estimated blood loss was minimal.      The examined duodenum was normal. Impression:             - LA Grade C reflux esophagitis with no bleeding.                        - Small hiatal hernia.                        - Gastritis. Biopsied.                        - Normal examined duodenum. Recommendation:        - Discharge patient to home.                        - Resume previous diet.                        - Continue present medications.                        - Await pathology results.                        - Repeat upper endoscopy in 3 months to check healing.                        - Use a proton pump inhibitor PO BID for 3 months. Procedure Code(s):     --- Professional ---                        [phone], Esophagogastroduodenoscopy, flexible,                         transoral; with biopsy, single or multiple Diagnosis Code(s):     --- Professional ---                        K21.00, Gastro-esophageal reflux disease with                         esophagitis, without bleeding                        K44.9, Diaphragmatic hernia without obstruction or  gangrene                        K29.70, Gastritis, unspecified, without bleeding                        R10.13, Epigastric pain CPT copyright 2022 American Medical Association. All rights reserved. The codes documented in this report are preliminary and upon coder review may  be revised to meet current compliance requirements. Eather Colas MD, MD 07/28/2023 12:34:41 PM Number of Addenda: 0 Note Initiated On: 07/28/2023 11:54 AM Estimated Blood Loss:  Estimated blood loss was minimal.      [facility]

## 2023-07-28 NOTE — Transfer of Care (Signed)
Immediate Anesthesia Transfer of Care Note  Patient: Basma H Justin  Procedure(s) Performed: COLONOSCOPY WITH PROPOFOL ESOPHAGOGASTRODUODENOSCOPY (EGD) WITH PROPOFOL BIOPSY  Patient Location: PACU  Anesthesia Type:MAC  Level of Consciousness: responds to stimulation  Airway & Oxygen Therapy: Patient Spontanous Breathing and Patient connected to nasal cannula oxygen  Post-op Assessment: Report given to RN and Post -op Vital signs reviewed and stable  Post vital signs: stable  Last Vitals:  Vitals Value Taken Time  BP 127/64 07/28/23 1235  Temp 96.2   Pulse 95 07/28/23 1236  Resp 13 07/28/23 1237  SpO2 99 % 07/28/23 1236  Vitals shown include unfiled device data.  Last Pain:  Vitals:   07/28/23 1104  TempSrc: Temporal  PainSc: 0-No pain         Complications: No notable events documented.

## 2023-07-28 NOTE — Op Note (Signed)
[facility] Patient Name: Charlene Parker Procedure Date: 07/28/2023 11:54 AM MRN: [id number] Account #: [id number] Date of Birth: [birth year] Admit Type: Outpatient Age: 72 Room: [facility] ENDO ROOM 3 Gender: Female Note Status: Supervisor Override Instrument Name: Nelda Marseille [id number] Procedure:             Colonoscopy Indications:           Screening for colorectal malignant neoplasm, Screening                         in patient at increased risk: Family history of                         1st-degree relative with colorectal cancer Providers:             Eather Colas MD, MD Medicines:             Monitored Anesthesia Care Complications:         No immediate complications. Procedure:             Pre-Anesthesia Assessment:                        - Prior to the procedure, a History and Physical was                         performed, and patient medications and allergies were                         reviewed. The patient is competent. The risks and                         benefits of the procedure and the sedation options and                         risks were discussed with the patient. All questions                         were answered and informed consent was obtained.                         Patient identification and proposed procedure were                         verified by the physician, the nurse, the                         anesthesiologist, the anesthetist and the technician                         in the endoscopy suite. Mental Status Examination:                         alert and oriented. Airway Examination: normal                         oropharyngeal airway and neck mobility. Respiratory                         Examination: clear to auscultation. CV Examination:  normal. Prophylactic Antibiotics: The patient does not                         require prophylactic antibiotics. Prior                          Anticoagulants: The patient has taken no anticoagulant                         or antiplatelet agents. ASA Grade Assessment: III - A                         patient with severe systemic disease. After reviewing                         the risks and benefits, the patient was deemed in                         satisfactory condition to undergo the procedure. The                         anesthesia plan was to use monitored anesthesia care                         (MAC). Immediately prior to administration of                         medications, the patient was re-assessed for adequacy                         to receive sedatives. The heart rate, respiratory                         rate, oxygen saturations, blood pressure, adequacy of                         pulmonary ventilation, and response to care were                         monitored throughout the procedure. The physical                         status of the patient was re-assessed after the                         procedure.                        After obtaining informed consent, the colonoscope was                         passed under direct vision. Throughout the procedure,                         the patient's blood pressure, pulse, and oxygen                         saturations were monitored continuously. The  Colonoscope was introduced through the anus and                         advanced to the the cecum, identified by appendiceal                         orifice and ileocecal valve. The colonoscopy was                         somewhat difficult due to significant looping. The                         patient tolerated the procedure well. The quality of                         the bowel preparation was good. The ileocecal valve,                         appendiceal orifice, and rectum were photographed. Findings:      The perianal and digital rectal examinations were normal.      Internal hemorrhoids were  found during retroflexion. The hemorrhoids       were Grade I (internal hemorrhoids that do not prolapse).      The exam was otherwise without abnormality on direct and retroflexion       views. Impression:            - Internal hemorrhoids.                        - The examination was otherwise normal on direct and                         retroflexion views.                        - No specimens collected. Recommendation:        - Discharge patient to home.                        - Resume previous diet.                        - Continue present medications.                        - Repeat colonoscopy is not recommended due to current                         age (66 years or older) for screening purposes.                        - Return to referring physician as previously                         scheduled. Procedure Code(s):     --- Professional ---                        Z3664, Colorectal cancer screening; colonoscopy on  individual not meeting criteria for high risk Diagnosis Code(s):     --- Professional ---                        Z12.11, Encounter for screening for malignant neoplasm                         of colon                        K64.0, First degree hemorrhoids CPT copyright 2022 American Medical Association. All rights reserved. The codes documented in this report are preliminary and upon coder review may  be revised to meet current compliance requirements. Eather Colas MD, MD 07/28/2023 12:36:40 PM Number of Addenda: 0 Note Initiated On: 07/28/2023 11:54 AM Scope Withdrawal Time: 0 hours 6 minutes 59 seconds  Total Procedure Duration: 0 hours 13 minutes 8 seconds  Estimated Blood Loss:  Estimated blood loss: none.      [facility]

## 2023-07-28 NOTE — Interval H&P Note (Signed)
History and Physical Interval Note:  07/28/2023 11:57 AM  Charlene Parker  has presented today for surgery, with the diagnosis of dyspepsia, family hx of colon cancer.  The various methods of treatment have been discussed with the patient and family. After consideration of risks, benefits and other options for treatment, the patient has consented to  Procedure(s): COLONOSCOPY WITH PROPOFOL (N/A) ESOPHAGOGASTRODUODENOSCOPY (EGD) WITH PROPOFOL (N/A) as a surgical intervention.  The patient's history has been reviewed, patient examined, no change in status, stable for surgery.  I have reviewed the patient's chart and labs.  Questions were answered to the patient's satisfaction.     Regis Bill  Ok to proceed with EGD/Colonoscopy

## 2023-07-28 NOTE — Anesthesia Preprocedure Evaluation (Signed)
Anesthesia Evaluation  Patient identified by MRN, date of birth, ID band Patient awake    Reviewed: Allergy & Precautions, NPO status , Patient's Chart, lab work & pertinent test results  History of Anesthesia Complications Negative for: history of anesthetic complications  Airway Mallampati: III  TM Distance: >3 FB Neck ROM: full    Dental no notable dental hx.    Pulmonary neg pulmonary ROS   Pulmonary exam normal        Cardiovascular hypertension, On Medications negative cardio ROS Normal cardiovascular exam     Neuro/Psych  Headaches  negative psych ROS   GI/Hepatic Neg liver ROS,GERD  Medicated,,  Endo/Other  negative endocrine ROSdiabetes    Renal/GU negative Renal ROS  negative genitourinary   Musculoskeletal   Abdominal   Peds  Hematology negative hematology ROS (+)   Anesthesia Other Findings Past Medical History: No date: Arthritis     Comment:  knee 04/10/2020: COVID-19 No date: Diabetes mellitus without complication (HCC) No date: Frequent headaches No date: GERD (gastroesophageal reflux disease) No date: Hypertension  Past Surgical History: No date: CARPAL TUNNEL RELEASE; Right 09/29/2020: CATARACT EXTRACTION W/PHACO; Right     Comment:  Procedure: CATARACT EXTRACTION PHACO AND INTRAOCULAR               LENS PLACEMENT (IOC) RIGHT DIABETIC 4.05 00:33.4;                Surgeon: Galen Manila, MD;  Location: [facility]              [facility];  Service: Ophthalmology;  Laterality: Right; 10/20/2020: CATARACT EXTRACTION W/PHACO; Left     Comment:  Procedure: CATARACT EXTRACTION PHACO AND INTRAOCULAR               LENS PLACEMENT (IOC) LEFT DIABETIC 5.42 00:30.0;                Surgeon: Galen Manila, MD;  Location: [facility]              [facility];  Service: Ophthalmology;  Laterality: Left;                Diabetic No date: CESAREAN SECTION No date: CHOLECYSTECTOMY 07/20/2017:  CHONDROPLASTY; Left     Comment:  Procedure: CHONDROPLASTY;  Surgeon: Signa Kell, MD;                Location: [facility];  Service: Orthopedics;                Laterality: Left;  Diabetic - insulin and oral meds No date: COLONOSCOPY No date: GALLBLADDER SURGERY 07/20/2017: KNEE ARTHROSCOPY WITH MENISCAL REPAIR; Left     Comment:  Procedure: KNEE ARTHROSCOPY WITH MENISCAL REPAIR;                Surgeon: Signa Kell, MD;  Location: [facility]               CN[facility];  Service: Orthopedics;  Laterality: Left;  ADDUCTOR              CANAL NERVE BLOCK No date: KNEE SURGERY; Right  BMI    Body Mass Index: 38.44 kg/m      Reproductive/Obstetrics negative OB ROS                             Anesthesia Physical Anesthesia Plan  ASA: 3  Anesthesia Plan: General   Post-op Pain Management: Minimal or no pain  anticipated   Induction: Intravenous  PONV Risk Score and Plan: 2 and Propofol infusion and TIVA  Airway Management Planned: Natural Airway and Nasal Cannula  Additional Equipment:   Intra-op Plan:   Post-operative Plan:   Informed Consent: I have reviewed the patients History and Physical, chart, labs and discussed the procedure including the risks, benefits and alternatives for the proposed anesthesia with the patient or authorized representative who has indicated his/her understanding and acceptance.     Dental Advisory Given  Plan Discussed with: Anesthesiologist, CRNA and Surgeon  Anesthesia Plan Comments: (Patient consented for risks of anesthesia including but not limited to:  - adverse reactions to medications - risk of airway placement if required - damage to eyes, teeth, lips or other oral mucosa - nerve damage due to positioning  - sore throat or hoarseness - Damage to heart, brain, nerves, lungs, other parts of body or loss of life  Patient voiced understanding and assent.)        Anesthesia Quick Evaluation

## 2023-07-29 NOTE — Anesthesia Postprocedure Evaluation (Signed)
Anesthesia Post Note  Patient: Monika H Surita  Procedure(s) Performed: COLONOSCOPY WITH PROPOFOL ESOPHAGOGASTRODUODENOSCOPY (EGD) WITH PROPOFOL BIOPSY  Patient location during evaluation: Endoscopy Anesthesia Type: General Level of consciousness: awake and alert Pain management: pain level controlled Vital Signs Assessment: post-procedure vital signs reviewed and stable Respiratory status: spontaneous breathing, nonlabored ventilation, respiratory function stable and patient connected to nasal cannula oxygen Cardiovascular status: blood pressure returned to baseline and stable Postop Assessment: no apparent nausea or vomiting Anesthetic complications: no   No notable events documented.   Last Vitals:  Vitals:   07/28/23 1235 07/28/23 1305  BP:    Pulse:    Resp:    Temp: (!) 35.7 C   SpO2:  100%    Last Pain:  Vitals:   07/28/23 1305  TempSrc:   PainSc: 0-No pain                 Louie Boston

## 2023-07-31 ENCOUNTER — Encounter: Payer: Self-pay | Admitting: Gastroenterology

## 2023-07-31 LAB — SURGICAL PATHOLOGY

## 2023-08-01 ENCOUNTER — Telehealth: Payer: Self-pay | Admitting: Family Medicine

## 2023-08-01 NOTE — Telephone Encounter (Signed)
Pt called stating someone called her from this office about a neurology appointment

## 2023-08-23 DIAGNOSIS — E1159 Type 2 diabetes mellitus with other circulatory complications: Secondary | ICD-10-CM | POA: Diagnosis not present

## 2023-08-23 DIAGNOSIS — E1169 Type 2 diabetes mellitus with other specified complication: Secondary | ICD-10-CM | POA: Diagnosis not present

## 2023-08-23 DIAGNOSIS — E1165 Type 2 diabetes mellitus with hyperglycemia: Secondary | ICD-10-CM | POA: Diagnosis not present

## 2023-08-23 DIAGNOSIS — Z794 Long term (current) use of insulin: Secondary | ICD-10-CM | POA: Diagnosis not present

## 2023-08-23 LAB — HEMOGLOBIN A1C: Hemoglobin A1C: 6.9

## 2023-09-24 DIAGNOSIS — L299 Pruritus, unspecified: Secondary | ICD-10-CM | POA: Diagnosis not present

## 2023-09-24 DIAGNOSIS — L506 Contact urticaria: Secondary | ICD-10-CM | POA: Diagnosis not present

## 2023-10-30 ENCOUNTER — Ambulatory Visit: Admission: RE | Admit: 2023-10-30 | Payer: Medicare Other | Source: Home / Self Care

## 2023-10-30 ENCOUNTER — Encounter: Admission: RE | Payer: Self-pay | Source: Home / Self Care

## 2023-10-30 SURGERY — ESOPHAGOGASTRODUODENOSCOPY (EGD) WITH PROPOFOL
Anesthesia: General

## 2023-10-30 MED ORDER — PROPOFOL 1000 MG/100ML IV EMUL
INTRAVENOUS | Status: AC
  Filled 2023-10-30: qty 100

## 2023-10-30 MED ORDER — LIDOCAINE HCL (PF) 2 % IJ SOLN
INTRAMUSCULAR | Status: AC
  Filled 2023-10-30: qty 5

## 2023-11-06 ENCOUNTER — Other Ambulatory Visit: Payer: Self-pay | Admitting: Family Medicine

## 2023-11-06 DIAGNOSIS — E559 Vitamin D deficiency, unspecified: Secondary | ICD-10-CM

## 2023-11-08 ENCOUNTER — Telehealth: Payer: Self-pay

## 2023-11-08 NOTE — Telephone Encounter (Signed)
 Copied from CRM [id number]. Topic: Clinical - Medication Question >> Nov 08, 2023  9:47 AM Almira Coaster wrote: Reason for CRM: Patient is calling regarding her Vitamin D, Ergocalciferol, (DRISDOL) 1.25 MG (50000 UNIT) CAPS capsule prescription, pharmacist at [facility] asked patient to speak with provider to determine if they should increase the dose, patient is a bit confused and would like carnification. Best call back number is [phone].

## 2023-11-08 NOTE — Telephone Encounter (Signed)
 Called pt and advised pt that Dr. Clent Ridges has sent in Vitamin D and to take 1 capsule once a week for 6 months.

## 2023-12-11 ENCOUNTER — Ambulatory Visit: Admitting: Family Medicine

## 2023-12-11 ENCOUNTER — Encounter: Payer: Self-pay | Admitting: Family Medicine

## 2023-12-11 VITALS — BP 136/78 | HR 67 | Temp 97.9°F | Resp 20 | Ht 63.0 in | Wt 214.2 lb

## 2023-12-11 DIAGNOSIS — E119 Type 2 diabetes mellitus without complications: Secondary | ICD-10-CM | POA: Diagnosis not present

## 2023-12-11 DIAGNOSIS — E538 Deficiency of other specified B group vitamins: Secondary | ICD-10-CM

## 2023-12-11 DIAGNOSIS — E559 Vitamin D deficiency, unspecified: Secondary | ICD-10-CM | POA: Diagnosis not present

## 2023-12-11 DIAGNOSIS — Z794 Long term (current) use of insulin: Secondary | ICD-10-CM

## 2023-12-11 DIAGNOSIS — Z1329 Encounter for screening for other suspected endocrine disorder: Secondary | ICD-10-CM

## 2023-12-11 DIAGNOSIS — R413 Other amnesia: Secondary | ICD-10-CM

## 2023-12-11 DIAGNOSIS — E1159 Type 2 diabetes mellitus with other circulatory complications: Secondary | ICD-10-CM

## 2023-12-11 DIAGNOSIS — I152 Hypertension secondary to endocrine disorders: Secondary | ICD-10-CM

## 2023-12-11 DIAGNOSIS — E1169 Type 2 diabetes mellitus with other specified complication: Secondary | ICD-10-CM

## 2023-12-11 LAB — CBC WITH DIFFERENTIAL/PLATELET
Basophils Absolute: 0 10*3/uL (ref 0.0–0.1)
Basophils Relative: 0.7 % (ref 0.0–3.0)
Eosinophils Absolute: 0.1 10*3/uL (ref 0.0–0.7)
Eosinophils Relative: 1.5 % (ref 0.0–5.0)
HCT: 42.9 % (ref 36.0–46.0)
Hemoglobin: 14.1 g/dL (ref 12.0–15.0)
Lymphocytes Relative: 25.2 % (ref 12.0–46.0)
Lymphs Abs: 1.8 10*3/uL (ref 0.7–4.0)
MCHC: 32.9 g/dL (ref 30.0–36.0)
MCV: 84.8 fl (ref 78.0–100.0)
Monocytes Absolute: 0.4 10*3/uL (ref 0.1–1.0)
Monocytes Relative: 5.6 % (ref 3.0–12.0)
Neutro Abs: 4.7 10*3/uL (ref 1.4–7.7)
Neutrophils Relative %: 67 % (ref 43.0–77.0)
Platelets: 256 10*3/uL (ref 150.0–400.0)
RBC: 5.06 Mil/uL (ref 3.87–5.11)
RDW: 13.6 % (ref 11.5–15.5)
WBC: 7 10*3/uL (ref 4.0–10.5)

## 2023-12-11 LAB — COMPREHENSIVE METABOLIC PANEL WITH GFR
ALT: 14 U/L (ref 0–35)
AST: 16 U/L (ref 0–37)
Albumin: 4.3 g/dL (ref 3.5–5.2)
Alkaline Phosphatase: 105 U/L (ref 39–117)
BUN: 17 mg/dL (ref 6–23)
CO2: 27 meq/L (ref 19–32)
Calcium: 10.4 mg/dL (ref 8.4–10.5)
Chloride: 104 meq/L (ref 96–112)
Creatinine, Ser: 0.86 mg/dL (ref 0.40–1.20)
GFR: 67.54 mL/min (ref >60.00)
Glucose, Bld: 166 mg/dL — ABNORMAL HIGH (ref 70–99)
Potassium: 4.3 meq/L (ref 3.5–5.1)
Sodium: 139 meq/L (ref 135–145)
Total Bilirubin: 0.6 mg/dL (ref 0.2–1.2)
Total Protein: 7.5 g/dL (ref 6.0–8.3)

## 2023-12-11 LAB — MICROALBUMIN / CREATININE URINE RATIO
Creatinine,U: 221.6 mg/dL
Microalb Creat Ratio: 8.6 mg/g (ref 0.0–30.0)
Microalb, Ur: 1.9 mg/dL (ref 0.0–1.9)

## 2023-12-11 LAB — TSH: TSH: 1.2 u[IU]/mL (ref 0.35–5.50)

## 2023-12-11 LAB — VITAMIN B12: Vitamin B-12: 196 pg/mL — ABNORMAL LOW (ref 211–911)

## 2023-12-11 LAB — IBC + FERRITIN
Ferritin: 147.1 ng/mL (ref 10.0–291.0)
Iron: 67 ug/dL (ref 42–145)
Saturation Ratios: 22.1 % (ref 20.0–50.0)
TIBC: 303.8 ug/dL (ref 250.0–450.0)
Transferrin: 217 mg/dL (ref 212.0–360.0)

## 2023-12-11 LAB — VITAMIN D 25 HYDROXY (VIT D DEFICIENCY, FRACTURES): VITD: 47.4 ng/mL (ref 30.00–100.00)

## 2023-12-11 LAB — FOLATE: Folate: 8 ng/mL (ref >5.9)

## 2023-12-11 NOTE — Progress Notes (Signed)
 SUBJECTIVE:   Chief Complaint  Patient presents with   Memory Loss   HPI Presents to clinic for acute visit  Discussed the use of AI scribe software for clinical note transcription with the patient, who gave verbal consent to proceed.  History of Present Illness Charlene Parker is a 73 year old female with diabetes who presents with concerns about memory decline.  She has been experiencing memory decline, which her husband has also noticed. Although she believes her memory has not significantly worsened since September of last year, her husband disagrees. 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 She also experiences occasional confusion with her cell phone usage, preferring her landline and flip phone.  She has a history of diabetes and is currently taking Solostar insulin  at 44 units, Mounjaro, and Glucotrol. She previously took Trulicity but discontinued it due to adverse effects. Her blood sugar was 218 mg/dL this morning, and her diet, particularly what she eats before bed, affects her glucose levels. She sometimes forgets to take her medication, especially when her husband prepares breakfast, but generally takes it every morning.  No chest pain or shortness of breath, but she did feel dizzy during a church service when standing. She continues to drive herself and manages some household tasks, although her husband assists with certain responsibilities like laundry. She shares financial responsibilities with her husband, although she has occasionally forgotten to pay bills.    PERTINENT PMH / PSH: As above  OBJECTIVE:  BP 136/78   Pulse 67   Temp 97.9 F (36.6 C)   Resp 20   Ht 5\' 3"  (1.6 m)   Wt 214 lb 4 oz (97.2 kg)   SpO2 99%   BMI 37.95 kg/m    Physical Exam Vitals reviewed.  Constitutional:      General: She is not in acute distress.    Appearance: Normal appearance. She  is obese. She is not ill-appearing, toxic-appearing or diaphoretic.  Eyes:     General:        Right eye: No discharge.        Left eye: No discharge.     Conjunctiva/sclera: Conjunctivae normal.  Cardiovascular:     Rate and Rhythm: Normal rate and regular rhythm.     Heart sounds: Normal heart sounds.  Pulmonary:     Effort: Pulmonary effort is normal.     Breath sounds: Normal breath sounds.  Abdominal:     General: Bowel sounds are normal.  Musculoskeletal:        General: Normal range of motion.  Skin:    General: Skin is warm and dry.  Neurological:     General: No focal deficit present.     Mental Status: She is alert and oriented to person, place, and time. Mental status is at baseline.  Psychiatric:        Mood and Affect: Mood normal.        Behavior: Behavior normal.        Thought Content: Thought content normal.        Cognition and Memory: Memory is impaired.        Judgment: Judgment normal.           12/11/2023   10:38 AM 05/10/2023   12:57 PM 01/24/2023    9:23 AM 01/18/2023    9:43 AM 10/21/2022    2:04 PM  Depression screen PHQ 2/9  Decreased Interest 0  0 0 0 0  Down, Depressed, Hopeless 0 0 0 0 0  PHQ - 2 Score 0 0 0 0 0  Altered sleeping 0 0 0 0 2  Tired, decreased energy 1 0 0 0 2  Change in appetite 1 1 0 0 0  Feeling bad or failure about yourself  0 0 0 0 0  Trouble concentrating 0 0 0 0 0  Moving slowly or fidgety/restless 1 0 0 0 0  Suicidal thoughts 0 0 0 0 0  PHQ-9 Score 3 1 0 0 4  Difficult doing work/chores Somewhat difficult Not difficult at all Not difficult at all Not difficult at all Not difficult at all      12/11/2023   10:38 AM 05/10/2023   12:57 PM 01/24/2023    9:23 AM 10/21/2022    2:04 PM  GAD 7 : Generalized Anxiety Score  Nervous, Anxious, on Edge 0 0 0 0  Control/stop worrying 0 0 0 0  Worry too much - different things 0 0 0 0  Trouble relaxing 0 1 0 0  Restless 0 0 0 0  Easily annoyed or irritable 0 0 0 1  Afraid -  awful might happen 0 0 0 0  Total GAD 7 Score 0 1 0 1  Anxiety Difficulty Not difficult at all Not difficult at all Not difficult at all Not difficult at all    ASSESSMENT/PLAN:  Memory changes Assessment & Plan: Concerns about forgetfulness, particularly in medication adherence and managing finances. Differential includes age-related decline versus medication-induced effects. Neurological evaluation needed. - Refer previously sent to neurology for comprehensive memory evaluation in Sept.  Will check on referral. - Order blood work to rule out metabolic causes of memory decline.   Orders: -     TSH -     CBC with Differential/Platelet -     Folate -     IBC + Ferritin  Vitamin B 12 deficiency -     Vitamin B12  Vitamin D  deficiency -     VITAMIN D  25 Hydroxy (Vit-D Deficiency, Fractures)  Hypertension associated with diabetes (HCC) Assessment & Plan: Chronic.  Well controlled on current medication Continue Metoprolol  25 mg BID Check Cmet   Orders: -     Comprehensive metabolic panel with GFR  Diabetes mellitus type 2, insulin  dependent (HCC) Assessment & Plan: Chronic.  Asymptomatic.  Recent A1c 6.9 Managed with Solostar insulin , Mounjaro, and Glucotrol. Follows with Endocrinology   Orders: -     Microalbumin / creatinine urine ratio  Thyroid  disorder screening -     TSH  Other orders -     Vitamin B-12; Take 2 tablets (2,000 mcg total) by mouth daily.  Dispense: 90 tablet; Refill: 3    PDMP reviewed  Return if symptoms worsen or fail to improve, for PCP.  Valli Gaw, MD

## 2023-12-11 NOTE — Patient Instructions (Addendum)
 It was a pleasure meeting you today. Thank you for allowing me to take part in your health care.  Our goals for today as we discussed include:  Referral had been previously sent to Neurology for evaluation of memory  Please call to schedule appointment with Dr Mason Sole [address] [place], [place] [address] Phone: [phone]  We will get some labs today.  If they are abnormal or we need to do something about them, I will call you.  If they are normal, I will send you a message on MyChart (if it is active) or a letter in the mail.  If you don't hear from us  in 2 weeks, please call the office at the number below.     This is a list of the screening recommended for you and due dates:  Health Maintenance  Topic Date Due   DEXA scan (bone density measurement)  Never done   Pneumonia Vaccine (2 of 2 - PPSV23) 12/20/2017   Yearly kidney health urinalysis for diabetes  04/28/2023   Complete foot exam   04/28/2023   COVID-19 Vaccine (5 - 2024-25 season) 10/30/2023   Medicare Annual Wellness Visit  01/18/2024   Hemoglobin A1C  02/20/2024   Mammogram  02/28/2024   Flu Shot  03/15/2024   Yearly kidney function blood test for diabetes  05/10/2024   Eye exam for diabetics  06/14/2024   DTaP/Tdap/Td vaccine (2 - Td or Tdap) 12/10/2025   Colon Cancer Screening  07/27/2033   Hepatitis C Screening  Completed   Zoster (Shingles) Vaccine  Completed   HPV Vaccine  Aged Out   Meningitis B Vaccine  Aged Out     If you have any questions or concerns, please do not hesitate to call the office at [phone].  I look forward to our next visit and until then take care and stay safe.  Regards,   Valli Gaw, MD   [facility]

## 2023-12-17 ENCOUNTER — Encounter: Payer: Self-pay | Admitting: Family Medicine

## 2023-12-17 DIAGNOSIS — Z1329 Encounter for screening for other suspected endocrine disorder: Secondary | ICD-10-CM | POA: Insufficient documentation

## 2023-12-17 MED ORDER — VITAMIN B-12 1000 MCG PO TABS: 2000.0000 ug | ORAL_TABLET | Freq: Every day | ORAL | 3 refills | Status: AC

## 2023-12-17 NOTE — Assessment & Plan Note (Signed)
 Chronic.  Well controlled on current medication Continue Metoprolol  25 mg BID Check Cmet

## 2023-12-17 NOTE — Assessment & Plan Note (Addendum)
 Concerns about forgetfulness, particularly in medication adherence and managing finances. Differential includes age-related decline versus medication-induced effects. Neurological evaluation needed. - Refer previously sent to neurology for comprehensive memory evaluation in Sept.  Will check on referral. - Order blood work to rule out metabolic causes of memory decline.

## 2023-12-17 NOTE — Assessment & Plan Note (Signed)
 Chronic.  Asymptomatic.  Recent A1c 6.9 Managed with Solostar insulin , Mounjaro, and Glucotrol. Follows with Endocrinology

## 2024-01-24 ENCOUNTER — Ambulatory Visit: Payer: Medicare Other

## 2024-02-05 ENCOUNTER — Telehealth: Payer: Self-pay | Admitting: Family Medicine

## 2024-02-05 NOTE — Telephone Encounter (Unsigned)
 Copied from CRM [id number]. Topic: Clinical - Medical Advice >> Feb 05, 2024 10:04 AM Tiffini S wrote: Reason for CRM: Patient called stating that she opted out for a exam to be charged for $50.00. Was suppose to do this exam three weeks ago, spoke with Luke and changed her mind, decided not to participated. Patient have questions about her medication records, want to know if she will need her medical records for 02/06/24 consultation and asked for a call back at  [phone] home number.

## 2024-02-05 NOTE — Telephone Encounter (Signed)
 Routing encounter to PCP office for further evaluation & assistance: see below   Copied from CRM [id number]. Topic: Clinical - Medical Advice >> Feb 05, 2024 10:04 AM Tiffini S wrote: Reason for CRM: Patient called stating that she opted out for a exam to be charged for $50.00. Was suppose to do this exam three weeks ago, spoke with Luke and changed her mind, decided not to participated. Patient have questions about her medication records, want to know if she will need her medical records for 02/06/24 consultation and asked for a call back at  [phone] home number.

## 2024-02-05 NOTE — Telephone Encounter (Signed)
 Spoke top pt. Informed pt that if Gi needed any records they would reach out to us . Pt verbalized understanding

## 2024-02-06 DIAGNOSIS — K2100 Gastro-esophageal reflux disease with esophagitis, without bleeding: Secondary | ICD-10-CM | POA: Diagnosis not present

## 2024-02-06 DIAGNOSIS — K5909 Other constipation: Secondary | ICD-10-CM | POA: Diagnosis not present

## 2024-04-24 ENCOUNTER — Telehealth: Payer: Self-pay

## 2024-04-24 ENCOUNTER — Ambulatory Visit
Admission: EM | Admit: 2024-04-24 | Discharge: 2024-04-24 | Disposition: A | Discharge status: Home / Self Care | Attending: Emergency Medicine | Admitting: Emergency Medicine

## 2024-04-24 DIAGNOSIS — J069 Acute upper respiratory infection, unspecified: Secondary | ICD-10-CM

## 2024-04-24 DIAGNOSIS — I10 Essential (primary) hypertension: Secondary | ICD-10-CM

## 2024-04-24 LAB — POC SOFIA SARS ANTIGEN FIA: SARS Coronavirus 2 Ag: NEGATIVE

## 2024-04-24 MED ORDER — BENZONATATE 100 MG PO CAPS: 100.0000 mg | ORAL_CAPSULE | Freq: Three times a day (TID) | ORAL | 0 refills | Status: AC | PRN

## 2024-04-24 NOTE — Discharge Instructions (Addendum)
 Your blood pressure is elevated today at 157/50.  Please have this rechecked by your primary care provider in 2-4 weeks.     Your COVID test is negative.  Take the Tessalon  Perles as directed for cough.  Take Tylenol  as needed for fever or discomfort.  Follow up with your primary care provider tomorrow.  Go to the emergency department if you have worsening symptoms.

## 2024-04-24 NOTE — ED Provider Notes (Signed)
 CAY RALPH PELT    CSN: [id number] Arrival date & time: 04/24/24  1035      History   Chief Complaint Chief Complaint  Patient presents with   Nasal Congestion    HPI Charlene Parker is a 73 y.o. female.  Patient presents with 2-day history of runny nose, congestion, cough.  No fever, shortness of breath, chest pain, vomiting.  She has been treating her symptoms with Tylenol .  The history is provided by the patient and medical records.    Past Medical History:  Diagnosis Date   Arthritis    knee   COVID-19 04/10/2020   Diabetes mellitus without complication (HCC)    Frequent headaches    GERD (gastroesophageal reflux disease)    Hypertension     Patient Active Problem List   Diagnosis Date Noted   Thyroid  disorder screening 12/17/2023   Memory changes 05/19/2023   Vitamin B 12 deficiency 05/19/2023   Vitamin D  deficiency 05/19/2023   Breast cancer screening by mammogram 01/31/2023   Need for hepatitis C screening test 01/31/2023   Diabetes mellitus type 2, insulin  dependent (HCC) 10/21/2022   GERD (gastroesophageal reflux disease) 10/21/2022   Hypertension associated with diabetes (HCC) 01/16/2019   Hyperlipidemia associated with type 2 diabetes mellitus (HCC) 01/16/2019    Past Surgical History:  Procedure Laterality Date   BIOPSY  07/28/2023   Procedure: BIOPSY;  Surgeon: Maryruth Ole DASEN, MD;  Location: [facility];  Service: Endoscopy;;   CARPAL TUNNEL RELEASE Right    CATARACT EXTRACTION W/PHACO Right 09/29/2020   Procedure: CATARACT EXTRACTION PHACO AND INTRAOCULAR LENS PLACEMENT (IOC) RIGHT DIABETIC 4.05 00:33.4;  Surgeon: Jaye Fallow, MD;  Location: [facility];  Service: Ophthalmology;  Laterality: Right;   CATARACT EXTRACTION W/PHACO Left 10/20/2020   Procedure: CATARACT EXTRACTION PHACO AND INTRAOCULAR LENS PLACEMENT (IOC) LEFT DIABETIC 5.42 00:30.0;  Surgeon: Jaye Fallow, MD;  Location: [facility];  Service:  Ophthalmology;  Laterality: Left;  Diabetic   CESAREAN SECTION     CHOLECYSTECTOMY     CHONDROPLASTY Left 07/20/2017   Procedure: CHONDROPLASTY;  Surgeon: Tobie Priest, MD;  Location: [facility];  Service: Orthopedics;  Laterality: Left;  Diabetic - insulin  and oral meds   COLONOSCOPY     COLONOSCOPY WITH PROPOFOL  N/A 07/28/2023   Procedure: COLONOSCOPY WITH PROPOFOL ;  Surgeon: Maryruth Ole DASEN, MD;  Location: [facility];  Service: Endoscopy;  Laterality: N/A;   ESOPHAGOGASTRODUODENOSCOPY (EGD) WITH PROPOFOL  N/A 07/28/2023   Procedure: ESOPHAGOGASTRODUODENOSCOPY (EGD) WITH PROPOFOL ;  Surgeon: Maryruth Ole DASEN, MD;  Location: [facility];  Service: Endoscopy;  Laterality: N/A;   GALLBLADDER SURGERY     KNEE ARTHROSCOPY WITH MENISCAL REPAIR Left 07/20/2017   Procedure: KNEE ARTHROSCOPY WITH MENISCAL REPAIR;  Surgeon: Tobie Priest, MD;  Location: [facility];  Service: Orthopedics;  Laterality: Left;  ADDUCTOR CANAL NERVE BLOCK   KNEE SURGERY Right     OB History     Gravida  2   Para  2   Term  2   Preterm      AB      Living  2      SAB      IAB      Ectopic      Multiple      Live Births  2            Home Medications    Prior to Admission medications   Medication Sig Start Date End Date Taking? Authorizing Provider  acetaminophen  (TYLENOL ) 500 MG tablet Take by mouth.   Yes [provider]  BD PEN NEEDLE NANO 2ND GEN 32G X 4 MM MISC USE 1 EACH ONCE DAILY 08/17/22  Yes [provider]  benzonatate  (TESSALON ) 100 MG capsule Take 1 capsule (100 mg total) by mouth 3 (three) times daily as needed for cough. 04/24/24  Yes Corlis Burnard DEL, NP  bisacodyl (DULCOLAX) 5 MG EC tablet Take 5 mg by mouth daily as needed for moderate constipation.   Yes [provider]  calcium carbonate (TUMS - DOSED IN MG ELEMENTAL CALCIUM) 500 MG chewable tablet Chew 1 tablet by mouth as needed.   Yes [provider]  Calcium  Carbonate Antacid (TUMS PO) Take by mouth as needed.   Yes [provider]  CONTOUR NEXT TEST test strip 2 (two) times daily.   Yes [provider]  cyanocobalamin  (VITAMIN B12) 1000 MCG tablet Take 2 tablets (2,000 mcg total) by mouth daily. 12/17/23  Yes Hope Merle, MD  Fingerstix Lancets MISC 1 each. 05/04/23  Yes [provider]  glipiZIDE (GLUCOTROL XL) 5 MG 24 hr tablet Take 5 mg by mouth daily. 10/21/23  Yes [provider]  insulin  glargine (LANTUS SOLOSTAR) 100 UNIT/ML Solostar Pen Inject 40 Units into the skin. 06/11/20  Yes [provider]  meloxicam  (MOBIC ) 7.5 MG tablet Take 7.5 mg by mouth daily.   Yes [provider]  metoprolol  tartrate (LOPRESSOR ) 25 MG tablet Take 25 mg by mouth 2 (two) times daily.   Yes [provider]  MOUNJARO 2.5 MG/0.5ML Pen Inject 2.5 mg into the skin once a week. 04/06/23  Yes [provider]  Multiple Vitamins-Minerals (ZINC PO) Take by mouth.   Yes [provider]  pantoprazole  (PROTONIX ) 40 MG tablet Take 40 mg by mouth 2 (two) times daily.   Yes [provider]  pravastatin (PRAVACHOL) 20 MG tablet Take 1 tablet by mouth at bedtime. 08/23/23  Yes [provider]  rosuvastatin (CRESTOR) 10 MG tablet Take by mouth. 04/13/22  Yes [provider]  Vitamin D , Ergocalciferol , (DRISDOL ) 1.25 MG (50000 UNIT) CAPS capsule TAKE ONE CAPSULE BY MOUTH EVERY WEEK 11/07/23  Yes Hope Merle, MD    Family History Family History  Problem Relation Age of Onset   Diabetes Mother    Prostate cancer Father    Diabetes Sister    Diabetes Brother    Breast cancer Maternal Aunt        3s   Diabetes Maternal Aunt    Heart Problems Maternal Aunt    Breast cancer Maternal Aunt 93   Breast cancer Cousin        maternal   Breast cancer Cousin        maternal    Social History Social History   Tobacco Use   Smoking status: Never   Smokeless tobacco: Never   Vaping Use   Vaping status: Never Used  Substance Use Topics   Alcohol use: No    Alcohol/week: 0.0 standard drinks of alcohol   Drug use: No     Allergies   Prednisone    Review of Systems Review of Systems  Constitutional:  Negative for chills and fever.  HENT:  Positive for congestion and rhinorrhea. Negative for ear pain and sore throat.   Respiratory:  Positive for cough. Negative for shortness of breath.   Cardiovascular:  Negative for chest pain and palpitations.     Physical Exam Triage Vital Signs ED Triage  Vitals  Encounter Vitals Group     BP 04/24/24 1240 (!) 157/50     Girls Systolic BP Percentile --      Girls Diastolic BP Percentile --      Boys Systolic BP Percentile --      Boys Diastolic BP Percentile --      Pulse Rate 04/24/24 1240 77     Resp 04/24/24 1240 16     Temp 04/24/24 1240 97.9 F (36.6 C)     Temp src --      SpO2 04/24/24 1240 97 %     Weight 04/24/24 1235 222 lb (100.7 kg)     Height 04/24/24 1235 5' 3 (1.6 m)     Head Circumference --      Peak Flow --      Pain Score 04/24/24 1235 0     Pain Loc --      Pain Education --      Exclude from Growth Chart --    No data found.  Updated Vital Signs BP (!) 157/50 (BP Location: Left Arm)   Pulse 77   Temp 97.9 F (36.6 C)   Resp 16   Ht 5' 3 (1.6 m)   Wt 222 lb (100.7 kg)   SpO2 97%   BMI 39.33 kg/m   Visual Acuity Right Eye Distance:   Left Eye Distance:   Bilateral Distance:    Right Eye Near:   Left Eye Near:    Bilateral Near:     Physical Exam Constitutional:      General: She is not in acute distress. HENT:     Right Ear: Tympanic membrane normal.     Left Ear: Tympanic membrane normal.     Nose: Rhinorrhea present.     Mouth/Throat:     Mouth: Mucous membranes are moist.     Pharynx: Oropharynx is clear.  Cardiovascular:     Rate and Rhythm: Normal rate and regular rhythm.     Heart sounds: Normal heart sounds.  Pulmonary:     Effort: Pulmonary  effort is normal. No respiratory distress.     Breath sounds: Normal breath sounds.  Neurological:     Mental Status: She is alert.      UC Treatments / Results  Labs (all labs ordered are listed, but only abnormal results are displayed) Labs Reviewed  POC SOFIA SARS ANTIGEN FIA - Normal    EKG   Radiology No results found.  Procedures Procedures (including critical care time)  Medications Ordered in UC Medications - No data to display  Initial Impression / Assessment and Plan / UC Course  I have reviewed the triage vital signs and the nursing notes.  Pertinent labs & imaging results that were available during my care of the patient were reviewed by me and considered in my medical decision making (see chart for details).    Viral URI, elevated blood pressure reading with hypertension.  Afebrile and vital signs are stable.  Lungs are clear and O2 sat is 97% on room air.  Rapid COVID test is negative.  Treating cough with Tessalon  Perles.  Tylenol  as needed.  Education provided on viral respiratory infection.  Also discussed with patient that her blood pressure is elevated today and needs to be rechecked by PCP in 2 to 4 weeks.  Education provided on managing hypertension.  She agrees to plan of care.  Final Clinical Impressions(s) / UC Diagnoses   Final diagnoses:  Viral URI  Elevated blood pressure reading in office with diagnosis of hypertension     Discharge Instructions      Your blood pressure is elevated today at 157/50.  Please have this rechecked by your primary care provider in 2-4 weeks.     Your COVID test is negative.  Take the Tessalon  Perles as directed for cough.  Take Tylenol  as needed for fever or discomfort.  Follow up with your primary care provider tomorrow.  Go to the emergency department if you have worsening symptoms.         ED Prescriptions     Medication Sig Dispense Auth. Provider   benzonatate  (TESSALON ) 100 MG capsule Take 1  capsule (100 mg total) by mouth 3 (three) times daily as needed for cough. 21 capsule Corlis Burnard DEL, NP      PDMP not reviewed this encounter.   Corlis Burnard DEL, NP 04/24/24 [phone]

## 2024-04-24 NOTE — ED Triage Notes (Signed)
 Pt states that she has some nasal congestion and coughing. X2-3 days Pt states that she had covid 2-3 years ago and the cough never went away.

## 2024-04-24 NOTE — Telephone Encounter (Signed)
 Copied from CRM [id number]. Topic: Clinical - Medication Question >> Apr 24, 2024  2:04 PM Rea ORN wrote: Reason for CRM: Pt would like covid vaccine rx sent to [facility] [address] [place], [place] [address]  Pt requesting a call back once rx has been sent.

## 2024-04-26 ENCOUNTER — Telehealth: Payer: Self-pay

## 2024-04-26 ENCOUNTER — Encounter: Payer: Self-pay | Admitting: Nurse Practitioner

## 2024-04-26 ENCOUNTER — Ambulatory Visit (INDEPENDENT_AMBULATORY_CARE_PROVIDER_SITE_OTHER): Admitting: Nurse Practitioner

## 2024-04-26 VITALS — BP 126/82 | HR 76 | Temp 97.6°F | Ht 63.0 in | Wt 200.6 lb

## 2024-04-26 DIAGNOSIS — Z23 Encounter for immunization: Secondary | ICD-10-CM

## 2024-04-26 DIAGNOSIS — E785 Hyperlipidemia, unspecified: Secondary | ICD-10-CM

## 2024-04-26 DIAGNOSIS — E1169 Type 2 diabetes mellitus with other specified complication: Secondary | ICD-10-CM

## 2024-04-26 MED ORDER — COVID-19 MRNA VACC (MODERNA) 50 MCG/0.5ML IM SUSP: 0.5000 mL | Freq: Once | INTRAMUSCULAR | 0 refills | Status: AC

## 2024-04-26 MED ORDER — COVID-19 MRNA VAC-TRIS(PFIZER) 30 MCG/0.3ML IM SUSY: 0.3000 mL | PREFILLED_SYRINGE | Freq: Once | INTRAMUSCULAR | 0 refills | Status: DC

## 2024-04-26 NOTE — Telephone Encounter (Signed)
 Copied from CRM #[id number]. Topic: Clinical - Medication Question >> Apr 26, 2024 12:07 PM Berneda FALCON wrote: Reason for CRM: Patient was requesting a COVID vaccine but the [facility] ran out, and they would like this sent over to the [facility] on [address] Please call back when this is done. They are there at the pharmacy now  [facility] Address: [address] [place], [place], [place] [address] Phone [phone] Hours Open Closes 12 AM  Please call husband back [place] [phone]

## 2024-04-26 NOTE — Progress Notes (Addendum)
 Established Patient Office Visit  Subjective:  Patient ID: Charlene Parker, female    DOB: Mar 08, 1951  Age: 73 y.o. MRN: [id number]  CC:  Chief Complaint  Patient presents with   Acute Visit    Flu vaccine and covid vaccine prescription   Discussed the use of a AI scribe software for clinical note transcription with the patient, who gave verbal consent to proceed.  HPI  Charlene Parker is a 73 year old female who presents for medication management and vaccination updates.  She manages her diabetes with Mounjaro, prescribed by her endocrinologist. Her cholesterol medication has not been refilled since 2023 due to a pharmacy change from [facility] to [facility]. She confirms that her medications, including metoprolol , are now being filled at [facility]. She recently started seeing a neurologist and has an upcoming appointment with another provider in the same office.  HPI   Past Medical History:  Diagnosis Date   Arthritis    knee   COVID-19 04/10/2020   Diabetes mellitus without complication (HCC)    Frequent headaches    GERD (gastroesophageal reflux disease)    Hypertension     Past Surgical History:  Procedure Laterality Date   BIOPSY  07/28/2023   Procedure: BIOPSY;  Surgeon: Maryruth Ole DASEN, MD;  Location: [facility];  Service: Endoscopy;;   CARPAL TUNNEL RELEASE Right    CATARACT EXTRACTION W/PHACO Right 09/29/2020   Procedure: CATARACT EXTRACTION PHACO AND INTRAOCULAR LENS PLACEMENT (IOC) RIGHT DIABETIC 4.05 00:33.4;  Surgeon: Jaye Fallow, MD;  Location: [facility];  Service: Ophthalmology;  Laterality: Right;   CATARACT EXTRACTION W/PHACO Left 10/20/2020   Procedure: CATARACT EXTRACTION PHACO AND INTRAOCULAR LENS PLACEMENT (IOC) LEFT DIABETIC 5.42 00:30.0;  Surgeon: Jaye Fallow, MD;  Location: [facility];  Service: Ophthalmology;  Laterality: Left;  Diabetic   CESAREAN SECTION     CHOLECYSTECTOMY     CHONDROPLASTY Left 07/20/2017    Procedure: CHONDROPLASTY;  Surgeon: Tobie Priest, MD;  Location: [facility];  Service: Orthopedics;  Laterality: Left;  Diabetic - insulin  and oral meds   COLONOSCOPY     COLONOSCOPY WITH PROPOFOL  N/A 07/28/2023   Procedure: COLONOSCOPY WITH PROPOFOL ;  Surgeon: Maryruth Ole DASEN, MD;  Location: [facility];  Service: Endoscopy;  Laterality: N/A;   ESOPHAGOGASTRODUODENOSCOPY (EGD) WITH PROPOFOL  N/A 07/28/2023   Procedure: ESOPHAGOGASTRODUODENOSCOPY (EGD) WITH PROPOFOL ;  Surgeon: Maryruth Ole DASEN, MD;  Location: [facility];  Service: Endoscopy;  Laterality: N/A;   GALLBLADDER SURGERY     KNEE ARTHROSCOPY WITH MENISCAL REPAIR Left 07/20/2017   Procedure: KNEE ARTHROSCOPY WITH MENISCAL REPAIR;  Surgeon: Tobie Priest, MD;  Location: [facility];  Service: Orthopedics;  Laterality: Left;  ADDUCTOR CANAL NERVE BLOCK   KNEE SURGERY Right     Family History  Problem Relation Age of Onset   Diabetes Mother    Prostate cancer Father    Diabetes Sister    Diabetes Brother    Breast cancer Maternal Aunt        30s   Diabetes Maternal Aunt    Heart Problems Maternal Aunt    Breast cancer Maternal Aunt 81   Breast cancer Cousin        maternal   Breast cancer Cousin        maternal    Social History   Socioeconomic History   Marital status: Married    Spouse name: Not on file   Number of children: 2   Years of education:  Not on file   Highest education level: Bachelor's degree (e.g., BA, AB, BS)  Occupational History   Not on file  Tobacco Use   Smoking status: Never   Smokeless tobacco: Never  Vaping Use   Vaping status: Never Used  Substance and Sexual Activity   Alcohol use: No    Alcohol/week: 0.0 standard drinks of alcohol   Drug use: No   Sexual activity: Not Currently    Birth control/protection: Post-menopausal  Other Topics Concern   Not on file  Social History Narrative   Not on file   Social Drivers of Health   Financial Resource  Strain: Medium Risk (04/25/2024)   Overall Financial Resource Strain (CARDIA)    Difficulty of Paying Living Expenses: Somewhat hard  Food Insecurity: Unknown (04/25/2024)   Hunger Vital Sign    Worried About Programme researcher, broadcasting/film/video in the Last Year: Not on file    Ran Out of Food in the Last Year: Never true  Recent Concern: Food Insecurity - Food Insecurity Present (03/28/2024)   Received from [facility]   Hunger Vital Sign    Within the past 12 months, you worried that your food would run out before you got the money to buy more.: Sometimes true    Within the past 12 months, the food you bought just didn't last and you didn't have money to get more.: Sometimes true  Transportation Needs: No Transportation Needs (04/25/2024)   PRAPARE - Administrator, Civil Service (Medical): No    Lack of Transportation (Non-Medical): No  Physical Activity: Insufficiently Active (04/25/2024)   Exercise Vital Sign    Days of Exercise per Week: 1 day    Minutes of Exercise per Session: 10 min  Stress: No Stress Concern Present (04/25/2024)   [employer] of Occupational Health - Occupational Stress Questionnaire    Feeling of Stress: Not at all  Social Connections: Socially Integrated (04/25/2024)   Social Connection and Isolation Panel    Frequency of Communication with Friends and Family: More than three times a week    Frequency of Social Gatherings with Friends and Family: More than three times a week    Attends Religious Services: More than 4 times per year    Active Member of [employer] or Organizations: Yes    Attends Engineer, structural: More than 4 times per year    Marital Status: Married  Catering manager Violence: Not At Risk (01/18/2023)   Humiliation, Afraid, Rape, and Kick questionnaire    Fear of Current or Ex-Partner: No    Emotionally Abused: No    Physically Abused: No    Sexually Abused: No     Outpatient Medications Prior to Visit  Medication  Sig Dispense Refill   acetaminophen  (TYLENOL ) 500 MG tablet Take by mouth.     BD PEN NEEDLE NANO 2ND GEN 32G X 4 MM MISC USE 1 EACH ONCE DAILY     benzonatate  (TESSALON ) 100 MG capsule Take 1 capsule (100 mg total) by mouth 3 (three) times daily as needed for cough. 21 capsule 0   bisacodyl (DULCOLAX) 5 MG EC tablet Take 5 mg by mouth daily as needed for moderate constipation.     calcium carbonate (TUMS - DOSED IN MG ELEMENTAL CALCIUM) 500 MG chewable tablet Chew 1 tablet by mouth as needed.     Calcium Carbonate Antacid (TUMS PO) Take by mouth as needed.     CONTOUR NEXT TEST test strip 2 (  two) times daily.     cyanocobalamin  (VITAMIN B12) 1000 MCG tablet Take 2 tablets (2,000 mcg total) by mouth daily. 90 tablet 3   Fingerstix Lancets MISC 1 each.     glipiZIDE (GLUCOTROL XL) 5 MG 24 hr tablet Take 5 mg by mouth daily.     insulin  glargine (LANTUS SOLOSTAR) 100 UNIT/ML Solostar Pen Inject 40 Units into the skin.     meloxicam  (MOBIC ) 7.5 MG tablet Take 7.5 mg by mouth daily.     metoprolol  tartrate (LOPRESSOR ) 25 MG tablet Take 25 mg by mouth 2 (two) times daily.     MOUNJARO 2.5 MG/0.5ML Pen Inject 2.5 mg into the skin once a week.     Multiple Vitamins-Minerals (ZINC PO) Take by mouth.     pantoprazole  (PROTONIX ) 40 MG tablet Take 40 mg by mouth 2 (two) times daily.     pravastatin (PRAVACHOL) 20 MG tablet Take 1 tablet by mouth at bedtime.     rosuvastatin (CRESTOR) 10 MG tablet Take by mouth.     Vitamin D , Ergocalciferol , (DRISDOL ) 1.25 MG (50000 UNIT) CAPS capsule TAKE ONE CAPSULE BY MOUTH EVERY WEEK 12 capsule 1   No facility-administered medications prior to visit.    Allergies  Allergen Reactions   Prednisone  Other (See Comments)    Diabetic ketoacidosis    ROS Review of Systems Negative unless indicated in HPI.    Objective:    Physical Exam Constitutional:      Appearance: Normal appearance.  Cardiovascular:     Rate and Rhythm: Normal rate and regular rhythm.      Pulses: Normal pulses.     Heart sounds: Normal heart sounds.  Musculoskeletal:     Cervical back: Normal range of motion.  Neurological:     General: No focal deficit present.     Mental Status: She is alert. Mental status is at baseline.  Psychiatric:        Mood and Affect: Mood normal.        Behavior: Behavior normal.        Thought Content: Thought content normal.        Judgment: Judgment normal.     BP 126/82   Pulse 76   Temp 97.6 F (36.4 C)   Ht 5' 3 (1.6 m)   Wt 200 lb 9.6 oz (91 kg)   SpO2 98%   BMI 35.53 kg/m  Wt Readings from Last 3 Encounters:  04/26/24 200 lb 9.6 oz (91 kg)  04/24/24 222 lb (100.7 kg)  12/11/23 214 lb 4 oz (97.2 kg)     Health Maintenance  Topic Date Due   DEXA SCAN  Never done   Pneumococcal Vaccine: 50+ Years (2 of 2 - PPSV23, PCV20, or PCV21) 12/20/2017   FOOT EXAM  04/28/2023   Medicare Annual Wellness (AWV)  01/18/2024   HEMOGLOBIN A1C  02/20/2024   Mammogram  02/28/2024   OPHTHALMOLOGY EXAM  06/14/2024   COVID-19 Vaccine (6 - 2025-26 season) 10/24/2024   Diabetic kidney evaluation - eGFR measurement  12/10/2024   Diabetic kidney evaluation - Urine ACR  12/10/2024   Colonoscopy  07/27/2033   DTaP/Tdap/Td (3 - Td or Tdap) 04/18/2034   Influenza Vaccine  Completed   Hepatitis C Screening  Completed   Zoster Vaccines- Shingrix  Completed   Meningococcal B Vaccine  Aged Out   Hepatitis B Vaccines 19-59 Average Risk  Discontinued    There are no preventive care reminders to display for this patient.  Lab Results  Component Value Date   TSH 1.20 12/11/2023   Lab Results  Component Value Date   WBC 7.0 12/11/2023   HGB 14.1 12/11/2023   HCT 42.9 12/11/2023   MCV 84.8 12/11/2023   PLT 256.0 12/11/2023   Lab Results  Component Value Date   NA 139 12/11/2023   K 4.3 12/11/2023   CO2 27 12/11/2023   GLUCOSE 166 (H) 12/11/2023   BUN 17 12/11/2023   CREATININE 0.86 12/11/2023   BILITOT 0.6 12/11/2023   ALKPHOS  105 12/11/2023   AST 16 12/11/2023   ALT 14 12/11/2023   PROT 7.5 12/11/2023   ALBUMIN 4.3 12/11/2023   CALCIUM 10.4 12/11/2023   ANIONGAP 13 06/21/2020   EGFR 93 01/24/2023   GFR 67.54 12/11/2023   Lab Results  Component Value Date   CHOL 153 01/24/2023   Lab Results  Component Value Date   HDL 66 01/24/2023   Lab Results  Component Value Date   LDLCALC 73 01/24/2023   Lab Results  Component Value Date   TRIG 73 01/24/2023   Lab Results  Component Value Date   CHOLHDL 2.3 01/24/2023   Lab Results  Component Value Date   HGBA1C 6.9 08/23/2023      Assessment & Plan:  Hyperlipidemia associated with type 2 diabetes mellitus (HCC) Assessment & Plan: Chronic on statin therapy. -Continue statin therapy for cardiovascular risk reduction.   Need for immunization against influenza Assessment & Plan: Due for COVID and flu vaccinations. - Administer flu vaccine today. - COVID vaccine prescription sent to [facility].  Orders: -     Flu vaccine HIGH DOSE PF(Fluzone Trivalent)    Follow-up: Return for toc.   Skyeler Scalese, NP

## 2024-04-26 NOTE — Telephone Encounter (Signed)
 Prescription re-wrote and sent to [facility] pharmacy

## 2024-05-13 DIAGNOSIS — Z23 Encounter for immunization: Secondary | ICD-10-CM | POA: Insufficient documentation

## 2024-05-13 NOTE — Assessment & Plan Note (Signed)
 Due for COVID and flu vaccinations. - Administer flu vaccine today. - COVID vaccine prescription sent to [facility].

## 2024-05-15 ENCOUNTER — Encounter

## 2024-05-15 DIAGNOSIS — I152 Hypertension secondary to endocrine disorders: Secondary | ICD-10-CM

## 2024-05-15 DIAGNOSIS — R413 Other amnesia: Secondary | ICD-10-CM

## 2024-05-15 DIAGNOSIS — Z1329 Encounter for screening for other suspected endocrine disorder: Secondary | ICD-10-CM

## 2024-05-22 NOTE — Assessment & Plan Note (Signed)
 Chronic on statin therapy. -Continue statin therapy for cardiovascular risk reduction.

## 2024-08-16 ENCOUNTER — Encounter: Admitting: Nurse Practitioner

## 2024-10-03 ENCOUNTER — Encounter: Admitting: Nurse Practitioner

## 2024-11-12 ENCOUNTER — Encounter
